# Patient Record
Sex: Female | Born: 1960 | Race: Black or African American | Hispanic: No | Marital: Single | Smoking: Never smoker
Health system: Southern US, Community
[De-identification: ages and names within clinical notes are randomized; demographics above are authoritative.]

## PROBLEM LIST (undated history)

## (undated) DIAGNOSIS — D649 Anemia, unspecified: Secondary | ICD-10-CM

## (undated) DIAGNOSIS — J45909 Unspecified asthma, uncomplicated: Secondary | ICD-10-CM

## (undated) DIAGNOSIS — I10 Essential (primary) hypertension: Secondary | ICD-10-CM

## (undated) DIAGNOSIS — F419 Anxiety disorder, unspecified: Secondary | ICD-10-CM

## (undated) DIAGNOSIS — K219 Gastro-esophageal reflux disease without esophagitis: Secondary | ICD-10-CM

## (undated) DIAGNOSIS — F1010 Alcohol abuse, uncomplicated: Secondary | ICD-10-CM

## (undated) DIAGNOSIS — E871 Hypo-osmolality and hyponatremia: Secondary | ICD-10-CM

## (undated) HISTORY — PX: ANKLE SURGERY: SHX546

---

## 1999-06-12 ENCOUNTER — Encounter: Payer: Self-pay | Admitting: Emergency Medicine

## 1999-06-12 ENCOUNTER — Emergency Department (HOSPITAL_COMMUNITY): Admission: EM | Admit: 1999-06-12 | Discharge: 1999-06-12 | Payer: Self-pay | Admitting: Emergency Medicine

## 2000-05-07 ENCOUNTER — Encounter: Payer: Self-pay | Admitting: Emergency Medicine

## 2000-05-07 ENCOUNTER — Emergency Department (HOSPITAL_COMMUNITY): Admission: EM | Admit: 2000-05-07 | Discharge: 2000-05-07 | Payer: Self-pay | Admitting: Emergency Medicine

## 2001-02-27 ENCOUNTER — Emergency Department (HOSPITAL_COMMUNITY): Admission: EM | Admit: 2001-02-27 | Discharge: 2001-02-27 | Payer: Self-pay

## 2004-01-04 ENCOUNTER — Encounter (INDEPENDENT_AMBULATORY_CARE_PROVIDER_SITE_OTHER): Payer: Self-pay | Admitting: Nurse Practitioner

## 2004-01-04 LAB — CONVERTED CEMR LAB
Chlamydia, DNA Probe: NEGATIVE
GC Probe Amp, Genital: NEGATIVE
Pap Smear: NORMAL

## 2004-01-11 ENCOUNTER — Ambulatory Visit (HOSPITAL_COMMUNITY): Admission: RE | Admit: 2004-01-11 | Discharge: 2004-01-11 | Payer: Self-pay | Admitting: Family Medicine

## 2004-02-06 ENCOUNTER — Ambulatory Visit: Payer: Self-pay | Admitting: Family Medicine

## 2004-03-07 ENCOUNTER — Ambulatory Visit: Payer: Self-pay | Admitting: Family Medicine

## 2004-04-09 ENCOUNTER — Ambulatory Visit: Payer: Self-pay | Admitting: Internal Medicine

## 2004-04-25 ENCOUNTER — Ambulatory Visit: Payer: Self-pay | Admitting: *Deleted

## 2004-04-25 ENCOUNTER — Ambulatory Visit: Payer: Self-pay | Admitting: Internal Medicine

## 2004-05-09 ENCOUNTER — Ambulatory Visit: Payer: Self-pay | Admitting: Family Medicine

## 2004-05-23 ENCOUNTER — Ambulatory Visit: Payer: Self-pay | Admitting: Family Medicine

## 2004-06-07 ENCOUNTER — Ambulatory Visit: Payer: Self-pay | Admitting: Family Medicine

## 2004-06-18 ENCOUNTER — Ambulatory Visit: Payer: Self-pay | Admitting: Internal Medicine

## 2004-07-11 ENCOUNTER — Ambulatory Visit: Payer: Self-pay | Admitting: Family Medicine

## 2004-08-06 ENCOUNTER — Ambulatory Visit: Payer: Self-pay | Admitting: Family Medicine

## 2004-09-12 ENCOUNTER — Ambulatory Visit: Payer: Self-pay | Admitting: Family Medicine

## 2004-10-15 ENCOUNTER — Ambulatory Visit: Payer: Self-pay | Admitting: Family Medicine

## 2004-11-15 ENCOUNTER — Ambulatory Visit: Payer: Self-pay | Admitting: Family Medicine

## 2006-08-13 ENCOUNTER — Encounter (INDEPENDENT_AMBULATORY_CARE_PROVIDER_SITE_OTHER): Payer: Self-pay | Admitting: Nurse Practitioner

## 2006-08-13 ENCOUNTER — Ambulatory Visit: Payer: Self-pay | Admitting: Internal Medicine

## 2006-08-13 LAB — CONVERTED CEMR LAB
ALT: 18 U/L
AST: 26 U/L
Albumin: 4.3 g/dL
Alkaline Phosphatase: 53 U/L
BUN: 8 mg/dL
Calcium: 9.9 mg/dL
Chloride: 104 meq/L
Cholesterol: 187 mg/dL
Creatinine, Ser: 0.85 mg/dL
Glucose, Bld: 86 mg/dL
HDL: 104 mg/dL
HIV-1 Genotyping, DNA Sequencing: NEGATIVE
LDL Cholesterol: 70 mg/dL
Potassium: 4.6 meq/L
Sodium: 139 meq/L
TSH: 2.605 u[IU]/mL
Total Bilirubin: 0.4 mg/dL
Total CHOL/HDL Ratio: 1.8
Triglycerides: 64 mg/dL
VLDL: 13 mg/dL

## 2006-12-31 ENCOUNTER — Ambulatory Visit: Payer: Self-pay | Admitting: Internal Medicine

## 2006-12-31 ENCOUNTER — Encounter (INDEPENDENT_AMBULATORY_CARE_PROVIDER_SITE_OTHER): Payer: Self-pay | Admitting: Nurse Practitioner

## 2007-01-01 ENCOUNTER — Encounter (INDEPENDENT_AMBULATORY_CARE_PROVIDER_SITE_OTHER): Payer: Self-pay | Admitting: Nurse Practitioner

## 2007-01-01 LAB — CONVERTED CEMR LAB
Basophils Absolute: 0 10*3/uL (ref 0.0–0.1)
Eosinophils Absolute: 0 10*3/uL (ref 0.0–0.7)
HCT: 39.4 % (ref 36.0–46.0)
Hemoglobin: 12.9 g/dL (ref 12.0–15.0)
MCV: 107.9 fL — ABNORMAL HIGH (ref 78.0–100.0)
Monocytes Absolute: 0.5 10*3/uL (ref 0.2–0.7)
WBC: 5.6 10*3/uL (ref 4.0–10.5)

## 2007-01-02 ENCOUNTER — Ambulatory Visit: Payer: Self-pay | Admitting: Internal Medicine

## 2007-01-08 ENCOUNTER — Ambulatory Visit: Payer: Self-pay | Admitting: Family Medicine

## 2007-01-12 ENCOUNTER — Encounter: Payer: Self-pay | Admitting: Nurse Practitioner

## 2007-01-12 DIAGNOSIS — R609 Edema, unspecified: Secondary | ICD-10-CM | POA: Insufficient documentation

## 2007-01-12 DIAGNOSIS — E538 Deficiency of other specified B group vitamins: Secondary | ICD-10-CM

## 2007-01-12 DIAGNOSIS — I10 Essential (primary) hypertension: Secondary | ICD-10-CM | POA: Insufficient documentation

## 2007-01-12 DIAGNOSIS — E669 Obesity, unspecified: Secondary | ICD-10-CM

## 2007-02-09 ENCOUNTER — Ambulatory Visit: Payer: Self-pay | Admitting: Family Medicine

## 2007-02-11 ENCOUNTER — Encounter (INDEPENDENT_AMBULATORY_CARE_PROVIDER_SITE_OTHER): Payer: Self-pay | Admitting: *Deleted

## 2007-02-18 ENCOUNTER — Ambulatory Visit: Payer: Self-pay | Admitting: Nurse Practitioner

## 2007-02-18 DIAGNOSIS — M79609 Pain in unspecified limb: Secondary | ICD-10-CM | POA: Insufficient documentation

## 2007-02-18 DIAGNOSIS — I868 Varicose veins of other specified sites: Secondary | ICD-10-CM

## 2007-02-18 LAB — CONVERTED CEMR LAB
ALT: 18 units/L (ref 0–35)
AST: 30 units/L (ref 0–37)
Albumin: 3.9 g/dL (ref 3.5–5.2)
Alkaline Phosphatase: 46 units/L (ref 39–117)
BUN: 11 mg/dL (ref 6–23)
Basophils Absolute: 0 10*3/uL (ref 0.0–0.1)
Cholesterol: 171 mg/dL (ref 0–200)
Creatinine, Ser: 0.72 mg/dL (ref 0.40–1.20)
Eosinophils Absolute: 0 10*3/uL (ref 0.0–0.7)
Glucose, Bld: 98 mg/dL (ref 70–99)
Hemoglobin: 12.4 g/dL (ref 12.0–15.0)
Lymphs Abs: 2.1 10*3/uL (ref 0.7–3.3)
MCHC: 32.8 g/dL (ref 30.0–36.0)
Neutrophils Relative %: 53 % (ref 43–77)
Platelets: 270 10*3/uL (ref 150–400)
Potassium: 4.4 meq/L (ref 3.5–5.3)
RBC: 3.28 M/uL — ABNORMAL LOW (ref 3.87–5.11)
Sodium: 141 meq/L (ref 135–145)
TSH: 3.115 microintl units/mL (ref 0.350–5.50)
Total Protein: 6.8 g/dL (ref 6.0–8.3)

## 2007-02-20 ENCOUNTER — Encounter (INDEPENDENT_AMBULATORY_CARE_PROVIDER_SITE_OTHER): Payer: Self-pay | Admitting: Nurse Practitioner

## 2007-03-02 ENCOUNTER — Emergency Department (HOSPITAL_COMMUNITY): Admission: EM | Admit: 2007-03-02 | Discharge: 2007-03-02 | Payer: Self-pay | Admitting: Emergency Medicine

## 2007-03-03 ENCOUNTER — Encounter (INDEPENDENT_AMBULATORY_CARE_PROVIDER_SITE_OTHER): Payer: Self-pay | Admitting: Emergency Medicine

## 2007-03-03 ENCOUNTER — Ambulatory Visit: Payer: Self-pay | Admitting: Vascular Surgery

## 2007-03-03 ENCOUNTER — Ambulatory Visit (HOSPITAL_COMMUNITY): Admission: RE | Admit: 2007-03-03 | Discharge: 2007-03-03 | Payer: Self-pay | Admitting: Emergency Medicine

## 2007-03-08 ENCOUNTER — Emergency Department (HOSPITAL_COMMUNITY): Admission: EM | Admit: 2007-03-08 | Discharge: 2007-03-08 | Payer: Self-pay | Admitting: Emergency Medicine

## 2007-03-18 ENCOUNTER — Ambulatory Visit: Payer: Self-pay | Admitting: Family Medicine

## 2007-03-18 DIAGNOSIS — M7750 Other enthesopathy of unspecified foot: Secondary | ICD-10-CM | POA: Insufficient documentation

## 2007-03-18 DIAGNOSIS — I8000 Phlebitis and thrombophlebitis of superficial vessels of unspecified lower extremity: Secondary | ICD-10-CM

## 2007-03-23 ENCOUNTER — Encounter (INDEPENDENT_AMBULATORY_CARE_PROVIDER_SITE_OTHER): Payer: Self-pay | Admitting: *Deleted

## 2007-04-13 ENCOUNTER — Ambulatory Visit: Payer: Self-pay | Admitting: Nurse Practitioner

## 2007-04-21 ENCOUNTER — Ambulatory Visit (HOSPITAL_COMMUNITY): Admission: RE | Admit: 2007-04-21 | Discharge: 2007-04-21 | Payer: Self-pay | Admitting: Family Medicine

## 2007-05-29 ENCOUNTER — Encounter: Admission: RE | Admit: 2007-05-29 | Discharge: 2007-05-29 | Payer: Self-pay | Admitting: Family Medicine

## 2007-07-22 ENCOUNTER — Ambulatory Visit: Payer: Self-pay | Admitting: Nurse Practitioner

## 2007-11-04 ENCOUNTER — Emergency Department (HOSPITAL_COMMUNITY): Admission: EM | Admit: 2007-11-04 | Discharge: 2007-11-04 | Payer: Self-pay | Admitting: Emergency Medicine

## 2007-11-06 ENCOUNTER — Telehealth (INDEPENDENT_AMBULATORY_CARE_PROVIDER_SITE_OTHER): Payer: Self-pay | Admitting: Nurse Practitioner

## 2007-11-18 ENCOUNTER — Encounter (INDEPENDENT_AMBULATORY_CARE_PROVIDER_SITE_OTHER): Payer: Self-pay | Admitting: *Deleted

## 2007-11-25 ENCOUNTER — Ambulatory Visit: Payer: Self-pay | Admitting: Family Medicine

## 2007-11-25 DIAGNOSIS — M109 Gout, unspecified: Secondary | ICD-10-CM

## 2007-11-25 DIAGNOSIS — F1010 Alcohol abuse, uncomplicated: Secondary | ICD-10-CM

## 2007-12-11 ENCOUNTER — Ambulatory Visit: Payer: Self-pay | Admitting: Family Medicine

## 2007-12-14 ENCOUNTER — Ambulatory Visit: Payer: Self-pay | Admitting: Family Medicine

## 2008-01-05 ENCOUNTER — Telehealth (INDEPENDENT_AMBULATORY_CARE_PROVIDER_SITE_OTHER): Payer: Self-pay | Admitting: Family Medicine

## 2008-01-05 ENCOUNTER — Emergency Department (HOSPITAL_COMMUNITY): Admission: EM | Admit: 2008-01-05 | Discharge: 2008-01-05 | Payer: Self-pay | Admitting: Emergency Medicine

## 2008-01-18 ENCOUNTER — Encounter (INDEPENDENT_AMBULATORY_CARE_PROVIDER_SITE_OTHER): Payer: Self-pay | Admitting: Family Medicine

## 2008-01-18 ENCOUNTER — Ambulatory Visit: Payer: Self-pay | Admitting: Family Medicine

## 2008-01-28 ENCOUNTER — Telehealth (INDEPENDENT_AMBULATORY_CARE_PROVIDER_SITE_OTHER): Payer: Self-pay | Admitting: Family Medicine

## 2008-01-28 LAB — CONVERTED CEMR LAB
ALT: 11 units/L (ref 0–35)
Albumin: 4.3 g/dL (ref 3.5–5.2)
Alkaline Phosphatase: 67 units/L (ref 39–117)
Basophils Absolute: 0 10*3/uL (ref 0.0–0.1)
Calcium: 9.9 mg/dL (ref 8.4–10.5)
Creatinine, Ser: 0.82 mg/dL (ref 0.40–1.20)
Eosinophils Relative: 1 % (ref 0–5)
Glucose, Bld: 108 mg/dL — ABNORMAL HIGH (ref 70–99)
HDL: 74 mg/dL (ref >39)
Hemoglobin: 13.6 g/dL (ref 12.0–15.0)
LDL Cholesterol: 31 mg/dL (ref 0–99)
Lymphocytes Relative: 32 % (ref 12–46)
Lymphs Abs: 2.1 10*3/uL (ref 0.7–4.0)
Monocytes Absolute: 0.6 10*3/uL (ref 0.1–1.0)
Neutro Abs: 3.8 10*3/uL (ref 1.7–7.7)
Neutrophils Relative %: 57 % (ref 43–77)
RDW: 17.1 % — ABNORMAL HIGH (ref 11.5–15.5)
Sodium: 141 meq/L (ref 135–145)
TSH: 1.418 microintl units/mL (ref 0.350–4.50)
Total Bilirubin: 0.3 mg/dL (ref 0.3–1.2)
Total CHOL/HDL Ratio: 2

## 2008-02-18 ENCOUNTER — Ambulatory Visit: Payer: Self-pay | Admitting: Family Medicine

## 2008-02-24 ENCOUNTER — Ambulatory Visit: Payer: Self-pay | Admitting: Family Medicine

## 2008-02-24 DIAGNOSIS — M545 Low back pain: Secondary | ICD-10-CM

## 2008-02-25 ENCOUNTER — Ambulatory Visit (HOSPITAL_COMMUNITY): Admission: RE | Admit: 2008-02-25 | Discharge: 2008-02-25 | Payer: Self-pay | Admitting: Family Medicine

## 2008-03-22 ENCOUNTER — Ambulatory Visit: Payer: Self-pay | Admitting: Family Medicine

## 2008-04-05 ENCOUNTER — Telehealth (INDEPENDENT_AMBULATORY_CARE_PROVIDER_SITE_OTHER): Payer: Self-pay | Admitting: Family Medicine

## 2008-04-25 ENCOUNTER — Ambulatory Visit: Payer: Self-pay | Admitting: Family Medicine

## 2008-05-13 ENCOUNTER — Encounter: Admission: RE | Admit: 2008-05-13 | Discharge: 2008-05-13 | Payer: Self-pay | Admitting: Family Medicine

## 2008-05-25 ENCOUNTER — Ambulatory Visit: Payer: Self-pay | Admitting: Family Medicine

## 2008-06-24 ENCOUNTER — Ambulatory Visit: Payer: Self-pay | Admitting: Family Medicine

## 2008-06-24 ENCOUNTER — Telehealth (INDEPENDENT_AMBULATORY_CARE_PROVIDER_SITE_OTHER): Payer: Self-pay | Admitting: Family Medicine

## 2008-07-26 ENCOUNTER — Ambulatory Visit: Payer: Self-pay | Admitting: Family Medicine

## 2008-08-04 ENCOUNTER — Telehealth (INDEPENDENT_AMBULATORY_CARE_PROVIDER_SITE_OTHER): Payer: Self-pay | Admitting: Family Medicine

## 2008-08-17 ENCOUNTER — Telehealth (INDEPENDENT_AMBULATORY_CARE_PROVIDER_SITE_OTHER): Payer: Self-pay | Admitting: Family Medicine

## 2008-09-06 ENCOUNTER — Ambulatory Visit: Payer: Self-pay | Admitting: Family Medicine

## 2008-10-06 ENCOUNTER — Ambulatory Visit: Payer: Self-pay | Admitting: Family Medicine

## 2008-11-07 ENCOUNTER — Ambulatory Visit: Payer: Self-pay | Admitting: Family Medicine

## 2008-11-30 ENCOUNTER — Encounter (INDEPENDENT_AMBULATORY_CARE_PROVIDER_SITE_OTHER): Payer: Self-pay | Admitting: Nurse Practitioner

## 2008-12-16 ENCOUNTER — Ambulatory Visit: Payer: Self-pay | Admitting: Physician Assistant

## 2008-12-19 ENCOUNTER — Ambulatory Visit: Payer: Self-pay | Admitting: Internal Medicine

## 2009-01-17 ENCOUNTER — Telehealth (INDEPENDENT_AMBULATORY_CARE_PROVIDER_SITE_OTHER): Payer: Self-pay | Admitting: Family Medicine

## 2009-01-20 ENCOUNTER — Ambulatory Visit: Payer: Self-pay | Admitting: Nurse Practitioner

## 2009-02-07 ENCOUNTER — Ambulatory Visit: Payer: Self-pay | Admitting: Nurse Practitioner

## 2009-02-07 DIAGNOSIS — K219 Gastro-esophageal reflux disease without esophagitis: Secondary | ICD-10-CM

## 2009-02-09 ENCOUNTER — Encounter (INDEPENDENT_AMBULATORY_CARE_PROVIDER_SITE_OTHER): Payer: Self-pay | Admitting: Nurse Practitioner

## 2009-02-09 LAB — CONVERTED CEMR LAB
ALT: 13 units/L (ref 0–35)
AST: 20 units/L (ref 0–37)
Albumin: 4.1 g/dL (ref 3.5–5.2)
Alkaline Phosphatase: 48 units/L (ref 39–117)
Basophils Relative: 0 % (ref 0–1)
Calcium: 9.1 mg/dL (ref 8.4–10.5)
Chloride: 107 meq/L (ref 96–112)
Glucose, Bld: 98 mg/dL (ref 70–99)
HCT: 41.8 % (ref 36.0–46.0)
Hemoglobin: 13.6 g/dL (ref 12.0–15.0)
Lymphocytes Relative: 51 % — ABNORMAL HIGH (ref 12–46)
Lymphs Abs: 1.6 10*3/uL (ref 0.7–4.0)
MCHC: 32.5 g/dL (ref 30.0–36.0)
MCV: 103.2 fL — ABNORMAL HIGH (ref 78.0–100.0)
Neutrophils Relative %: 40 % — ABNORMAL LOW (ref 43–77)
Platelets: 245 10*3/uL (ref 150–400)
Potassium: 4.1 meq/L (ref 3.5–5.3)
RDW: 14.1 % (ref 11.5–15.5)
Total Protein: 6.8 g/dL (ref 6.0–8.3)
WBC: 3.1 10*3/uL — ABNORMAL LOW (ref 4.0–10.5)

## 2009-02-14 ENCOUNTER — Telehealth (INDEPENDENT_AMBULATORY_CARE_PROVIDER_SITE_OTHER): Payer: Self-pay | Admitting: Nurse Practitioner

## 2009-02-20 ENCOUNTER — Telehealth (INDEPENDENT_AMBULATORY_CARE_PROVIDER_SITE_OTHER): Payer: Self-pay | Admitting: Nurse Practitioner

## 2009-02-20 ENCOUNTER — Ambulatory Visit: Payer: Self-pay | Admitting: Internal Medicine

## 2009-02-28 ENCOUNTER — Encounter (INDEPENDENT_AMBULATORY_CARE_PROVIDER_SITE_OTHER): Payer: Self-pay | Admitting: *Deleted

## 2009-03-22 ENCOUNTER — Ambulatory Visit: Payer: Self-pay | Admitting: Nurse Practitioner

## 2009-04-25 ENCOUNTER — Ambulatory Visit: Payer: Self-pay | Admitting: Nurse Practitioner

## 2009-05-21 ENCOUNTER — Inpatient Hospital Stay (HOSPITAL_COMMUNITY): Admission: EM | Admit: 2009-05-21 | Discharge: 2009-05-23 | Payer: Self-pay | Admitting: Emergency Medicine

## 2009-06-01 ENCOUNTER — Telehealth (INDEPENDENT_AMBULATORY_CARE_PROVIDER_SITE_OTHER): Payer: Self-pay | Admitting: Nurse Practitioner

## 2009-06-07 ENCOUNTER — Encounter (INDEPENDENT_AMBULATORY_CARE_PROVIDER_SITE_OTHER): Payer: Self-pay | Admitting: Nurse Practitioner

## 2009-07-05 ENCOUNTER — Ambulatory Visit: Payer: Self-pay | Admitting: Nurse Practitioner

## 2009-07-05 DIAGNOSIS — S82899A Other fracture of unspecified lower leg, initial encounter for closed fracture: Secondary | ICD-10-CM | POA: Insufficient documentation

## 2009-07-06 LAB — CONVERTED CEMR LAB
HCT: 35.7 % — ABNORMAL LOW (ref 36.0–46.0)
Hemoglobin: 11.4 g/dL — ABNORMAL LOW (ref 12.0–15.0)
MCHC: 31.9 g/dL (ref 30.0–36.0)
Retic Ct Pct: 1.7 % (ref 0.4–3.1)
WBC: 4.8 10*3/uL (ref 4.0–10.5)

## 2009-07-12 ENCOUNTER — Ambulatory Visit: Payer: Self-pay | Admitting: Nurse Practitioner

## 2009-07-14 ENCOUNTER — Telehealth (INDEPENDENT_AMBULATORY_CARE_PROVIDER_SITE_OTHER): Payer: Self-pay | Admitting: Nurse Practitioner

## 2009-07-18 ENCOUNTER — Encounter (INDEPENDENT_AMBULATORY_CARE_PROVIDER_SITE_OTHER): Payer: Self-pay | Admitting: *Deleted

## 2009-08-09 ENCOUNTER — Ambulatory Visit: Payer: Self-pay | Admitting: Nurse Practitioner

## 2009-10-19 ENCOUNTER — Ambulatory Visit: Payer: Self-pay | Admitting: Nurse Practitioner

## 2009-10-19 ENCOUNTER — Telehealth (INDEPENDENT_AMBULATORY_CARE_PROVIDER_SITE_OTHER): Payer: Self-pay | Admitting: Nurse Practitioner

## 2009-10-24 ENCOUNTER — Encounter (INDEPENDENT_AMBULATORY_CARE_PROVIDER_SITE_OTHER): Payer: Self-pay | Admitting: *Deleted

## 2009-10-26 ENCOUNTER — Encounter (INDEPENDENT_AMBULATORY_CARE_PROVIDER_SITE_OTHER): Payer: Self-pay | Admitting: *Deleted

## 2009-11-16 ENCOUNTER — Ambulatory Visit: Payer: Self-pay | Admitting: Nurse Practitioner

## 2009-12-01 ENCOUNTER — Ambulatory Visit: Payer: Self-pay | Admitting: Nurse Practitioner

## 2009-12-01 DIAGNOSIS — A599 Trichomoniasis, unspecified: Secondary | ICD-10-CM | POA: Insufficient documentation

## 2009-12-04 ENCOUNTER — Encounter (INDEPENDENT_AMBULATORY_CARE_PROVIDER_SITE_OTHER): Payer: Self-pay | Admitting: Nurse Practitioner

## 2009-12-13 ENCOUNTER — Ambulatory Visit (HOSPITAL_COMMUNITY): Admission: RE | Admit: 2009-12-13 | Discharge: 2009-12-13 | Payer: Self-pay | Admitting: Internal Medicine

## 2009-12-18 ENCOUNTER — Ambulatory Visit: Payer: Self-pay | Admitting: Nurse Practitioner

## 2009-12-18 LAB — CONVERTED CEMR LAB
Glucose, Urine, Semiquant: NEGATIVE
Nitrite: NEGATIVE
Specific Gravity, Urine: >=1.03
WBC Urine, dipstick: NEGATIVE

## 2009-12-20 ENCOUNTER — Ambulatory Visit: Payer: Self-pay | Admitting: Nurse Practitioner

## 2010-01-18 ENCOUNTER — Ambulatory Visit: Payer: Self-pay | Admitting: Nurse Practitioner

## 2010-02-19 ENCOUNTER — Ambulatory Visit: Payer: Self-pay | Admitting: Nurse Practitioner

## 2010-03-21 ENCOUNTER — Ambulatory Visit: Payer: Self-pay | Admitting: Nurse Practitioner

## 2010-04-27 ENCOUNTER — Ambulatory Visit: Payer: Self-pay | Admitting: Nurse Practitioner

## 2010-05-28 ENCOUNTER — Telehealth (INDEPENDENT_AMBULATORY_CARE_PROVIDER_SITE_OTHER): Payer: Self-pay | Admitting: Internal Medicine

## 2010-06-13 ENCOUNTER — Ambulatory Visit
Admission: RE | Admit: 2010-06-13 | Discharge: 2010-06-13 | Payer: Self-pay | Source: Home / Self Care | Attending: Nurse Practitioner | Admitting: Nurse Practitioner

## 2010-06-13 ENCOUNTER — Encounter (INDEPENDENT_AMBULATORY_CARE_PROVIDER_SITE_OTHER): Payer: Self-pay | Admitting: Nurse Practitioner

## 2010-06-21 ENCOUNTER — Encounter (INDEPENDENT_AMBULATORY_CARE_PROVIDER_SITE_OTHER): Payer: Self-pay | Admitting: Nurse Practitioner

## 2010-06-24 LAB — CONVERTED CEMR LAB
Bilirubin Urine: NEGATIVE
Chlamydia, DNA Probe: NEGATIVE
Eosinophils Relative: 1 % (ref 0–5)
GC Probe Amp, Genital: NEGATIVE
Glucose, Urine, Semiquant: NEGATIVE
HCT: 36.5 % (ref 36.0–46.0)
Hemoglobin: 11.6 g/dL — ABNORMAL LOW (ref 12.0–15.0)
KOH Prep: NEGATIVE
Ketones, urine, test strip: NEGATIVE
Lymphocytes Relative: 38 % (ref 12–46)
MCHC: 31.8 g/dL (ref 30.0–36.0)
MCV: 91.3 fL (ref 78.0–100.0)
Nitrite: NEGATIVE
OCCULT 1: NEGATIVE
Protein, U semiquant: NEGATIVE
RDW: 14.5 % (ref 11.5–15.5)
Specific Gravity, Urine: 1.01
WBC: 3.6 10*3/uL — ABNORMAL LOW (ref 4.0–10.5)
pH: 6

## 2010-06-26 NOTE — Progress Notes (Signed)
Summary: Office Visit//DEPRESSION SCREENING  Office Visit//DEPRESSION SCREENING   Imported By: Arta Bruce 12/04/2009 08:56:32  _____________________________________________________________________  External Attachment:    Type:   Image     Comment:   External Document

## 2010-06-26 NOTE — Letter (Signed)
Summary: Handout Printed  Printed Handout:  - Vitamin B12 & Folate Test

## 2010-06-26 NOTE — Letter (Signed)
Summary: [facility]  [facility]   Imported By: Arta Bruce 08/02/2009 15:21:48  _____________________________________________________________________  External Attachment:    Type:   Image     Comment:   External Document

## 2010-06-26 NOTE — Letter (Signed)
Summary: *HSN Results Follow up  [facility]  [address] [place], [place] [address]   Phone: [phone]  Fax: [phone]      10/26/2009   Caroline Harvey [address] [place], [place]  [address]   Dear  [name],                            ____S.Drinkard,FNP   ____D. Gore,FNP       ____B. McPherson,MD   ____V. Rankins,MD    ____E. Mulberry,MD    ____N. Daphine Deutscher, FNP  ____D. Reche Dixon, MD    ____K. Philipp Deputy, MD    ____Other     This letter is to inform you that your recent test(s):  _______Pap Smear    _______Lab Test     _______X-ray    _______ is within acceptable limits  _______ requires a medication change  _______ requires a follow-up lab visit  _______ requires a follow-up visit with your provider   Comments: We have tried to reach you at [phone].  Please contact the office at your earliest convenience.      _________________________________________________________ If you have any questions, please contact our office                     Sincerely,  Levon Hedger [facility]

## 2010-06-26 NOTE — Assessment & Plan Note (Signed)
Summary: HTN   Vital Signs:  Patient profile:   50 year old female LMP:     06/23/2009 Weight:      326.6 pounds Temp:     97.8 degrees F oral Pulse rate:   77 / minute Pulse rhythm:   regular Resp:     20 per minute BP sitting:   162 / 93  (left arm) Cuff size:   regular  Vitals Entered By: Levon Hedger (July 05, 2009 2:13 PM) CC: HTN...B-12 injection, Hypertension Management Is Patient Diabetic? No Pain Assessment Patient in pain? no       Does patient need assistance? Functional Status Self care Ambulation Normal LMP (date): 06/23/2009 LMP - Character: normal Menarche (age onset years): 13    Menstrual flow (days): 3 Enter LMP: 06/23/2009 Last PAP Result normal   CC:  HTN...B-12 injection and Hypertension Management.  History of Present Illness:  Pt into the office with complaints of htn.  Reports that she has been having the nurse come into her home for evaluation following a slip at home on the ice during which she sustained a right ankle fracture.  Visits were twice weekly.  BP was elevated during those checks.  (no documentation to support but [facility] nurse did call this office) Pt also has follow up with orthopedic - Dr. Durene Cal.  Next appt is March 4th. Her last appt was February 2nd. She has finished taking her coumadin as ordered following the incident. She has a camboot in place and reports that she wears it the majority of the day but is allowed to have 3-4 times off for short periods of time for "exercise"  Vitamin B 12 Deficiency - Pt has been getting monthly injections. None in about 3 months. + increase in fatigue without the injections  Hypertension History:      She denies headache, chest pain, and palpitations.  She notes no problems with any antihypertensive medication side effects.  She has been taking Lisinopril 20mg  by mouth as ordered but pt does not have the medication present with her today.        Positive major cardiovascular risk  factors include hypertension.  Negative major cardiovascular risk factors include female age less than 13 years old, negative family history for ischemic heart disease, and non-tobacco-user status.     Allergies (verified): 1)  ! Cvs Ibuprofen (Ibuprofen)  Review of Systems General:  Denies fever. CV:  Denies chest pain or discomfort. Resp:  Denies cough. GI:  Denies abdominal pain, nausea, and vomiting. MS:  Complains of joint pain; right foot pain and has ongoing f/u with ortho.  Physical Exam  General:  alert.  obese Head:  normocephalic.   Lungs:  normal breath sounds.   Heart:  normal rate and regular rhythm.   Msk:  right cam boot in place Neurologic:  alert & oriented X3.   Skin:  color normal.   Psych:  Oriented X3.     Impression & Recommendations:  Problem # 1:  HYPERTENSION, BENIGN (ICD-401.1) increase lisinipril to 20mg  - 2 tablets by mouth daily will f/u in 1 week for bp check DASH diet if still elevated will add another medication Her updated medication list for this problem includes:    Lisinopril 20 Mg Tabs (Lisinopril) .Marland Kitchen... 2 tablets  by mouth every morning for blood pressure    Furosemide 20 Mg Tabs (Furosemide) ..... One tablet by mouth daily as needed for swelling  Problem # 2:  B12 DEFICIENCY (ICD-266.2)  will recheck labs today Orders: T- * Misc. Laboratory test [phone])  Complete Medication List: 1)  Cyanocobalamin 1000 Mcg/ml Inj Soln (Cyanocobalamin) .... One injection monthly im 2)  Protonix 40 Mg Tbec (Pantoprazole sodium) .... One tablet by mouth daily 30 minutes before breakfast 3)  Allopurinol 300 Mg Tabs (Allopurinol) .... One tablet by mouth daily for gout 4)  Lisinopril 20 Mg Tabs (Lisinopril) .... 2 tablets  by mouth every morning for blood pressure 5)  Flexeril 10 Mg Tabs (Cyclobenzaprine hcl) .... Take 1 tablet by mouth every 8 hours as needed muscle spasm 6)  Tramadol Hcl 50 Mg Tabs (Tramadol hcl) .... Take 1-2 tablets by mouth every  6-8 hours as needed gout flare. take wth colchicine 7)  Furosemide 20 Mg Tabs (Furosemide) .... One tablet by mouth daily as needed for swelling  Hypertension Assessment/Plan:      The patient's hypertensive risk group is category A: No risk factors and no target organ damage.  Her calculated 10 year risk of coronary heart disease is 3 %.  Today's blood pressure is 162/93.  Her blood pressure goal is < 140/90.  Patient Instructions: 1)  Lisinopril 20mg  - 2 tablets by mouth daily x 1 week. 2)  Follow up in 1 weeks for blood pressure check in the afternoon. 3)  Take your medication before this visit 4)  If blood pressure is still high will need to add another medications 5)  Your blood is 162/93. 6)  Your goal is 130/85. 7)  Your complete physical exam is due Scheduled this after you are cleared by orthopedic

## 2010-06-26 NOTE — Assessment & Plan Note (Signed)
Summary: Complete Physical Exam   Vital Signs:  Patient profile:   50 year old female LMP:     10/2009 Weight:      323.0 pounds BMI:     48.57 Pulse rate:   70 / minute Pulse rhythm:   regular Resp:     20 per minute BP sitting:   128 / 77  (left arm) Cuff size:   large  Vitals Entered By: Levon Hedger (December 01, 2009 9:34 AM)  Nutrition Counseling: Patient's BMI is greater than 25 and therefore counseled on weight management options. CC:   CPP, Hypertension Management Is Patient Diabetic? No Pain Assessment Patient in pain? no       Does patient need assistance? Functional Status Self care Ambulation Normal LMP (date): 10/2009 LMP - Character: normal Menarche (age onset years): 13    Menstrual flow (days): 3 Enter LMP: 10/2009 Last PAP Result normal   CC:    CPP and Hypertension Management.  History of Present Illness:  Pt into the office for complete physical exam  PAP - last done in 2009 in this office, all normal Menses still monthly No tubal ligation or hysterectomy No cervical or ovarian cancer the family Pt does have a same sex partner  Mammogram - paternal aunt with breast cancer last mammogram done 05/2008  Optho - no glasses or contacts - no current problems with eyes  Dental - no recent dental exam  Obesity - pt is really interested in losing weight.  She has lost 5 pounds since her last visit. Pt has started to increase the fruit in her diet.   She has started walking and does so about 4- times per week.  Anticoagulation Management History:      Negative risk factors for bleeding include an age less than 56 years old and no history of CVA/TIA.  The bleeding index is 'low risk'.  Positive CHADS2 values include History of HTN.  Negative CHADS2 values include Age > 29 years old, History of Diabetes, and Prior Stroke/CVA/TIA.    Hypertension History:      She denies headache, chest pain, and palpitations.  She notes no problems with any  antihypertensive medication side effects.        Positive major cardiovascular risk factors include hypertension.  Negative major cardiovascular risk factors include female age less than 39 years old, no history of diabetes or hyperlipidemia, negative family history for ischemic heart disease, and non-tobacco-user status.        Further assessment for target organ damage reveals no history of ASHD, cardiac end-organ damage (CHF/LVH), stroke/TIA, peripheral vascular disease, renal insufficiency, or hypertensive retinopathy.      Habits & Providers  Alcohol-Tobacco-Diet     Alcohol type: liquor     Tobacco Status: never     Passive Smoke Exposure: no  Exercise-Depression-Behavior     Does Patient Exercise: yes     Exercise Counseling: to improve exercise regimen     Type of exercise: walking     Have you felt down or hopeless? no     Have you felt little pleasure in things? no     Depression Counseling: not indicated; screening negative for depression     Drug Use: no     Seat Belt Use: 100     Sun Exposure: occasionally  Comments: PHQ-9 score = 4  Allergies (verified): 1)  ! Cvs Ibuprofen (Ibuprofen)  Social History: Does Patient Exercise:  yes  Review of Systems General:  Denies fever. Eyes:  Denies blurring. ENT:  Denies earache. CV:  Denies chest pain or discomfort. Resp:  Denies cough. GI:  Denies abdominal pain, nausea, and vomiting. GU:  Denies discharge. MS:  Denies joint pain. Derm:  Denies dryness. Neuro:  Denies headaches. Psych:  Denies depression.  Physical Exam  General:  alert.  obese Head:  normocephalic.   Eyes:  vision grossly intact, pupils equal, and pupils round.   Ears:  bil TM with bony landmarks present minimal cerumen Nose:  no nasal discharge.   Mouth:  pharynx pink and moist.   Neck:  supple.   Chest Wall:  no deformities.   Breasts:  skin/areolae normal, no masses, and no abnormal thickening.   Lungs:  normal breath sounds.   Heart:   normal rate and regular rhythm.   Abdomen:  soft, non-tender, and normal bowel sounds.   Rectal:  no external abnormalities.    Pelvic Exam  Vulva:      normal appearance.   Urethra and Bladder:      Urethra--no discharge.   Vagina:      copious discharge.   Cervix:      midposition.   Uterus:      smooth.   Adnexa:      nontender bilaterally.   Rectum:      normal, heme negative stool.      Impression & Recommendations:  Problem # 1:  ROUTINE GYNECOLOGICAL EXAMINATION (ICD-V72.31) pap done labs done PHQ-9 score = 4 rec optho and dental exam guaiac negative Orders: T-HIV Antibody  (Reflex) ([id number]) T-Syphilis Test (RPR) ([id number]) Hemoccult Guaiac-1 spec.(in office) (82270)  Problem # 2:  UNSPECIFIED BREAST SCREENING (ICD-V76.10) self breast exam placcard given mammogram scheduled Orders: Mammogram (Screening) (Mammo)  Problem # 3:  HYPERTENSION, BENIGN (ICD-401.1) BP doing good continue current meds Her updated medication list for this problem includes:    Lisinopril 20 Mg Tabs (Lisinopril) .Marland Kitchen... 2 tablets  by mouth every morning for blood pressure (hold)    Furosemide 20 Mg Tabs (Furosemide) ..... 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 One injection monthly im 2)  Protonix 40 Mg Tbec (Pantoprazole sodium) .... One tablet by mouth daily 30 minutes before breakfast 3)  Lisinopril 20 Mg Tabs (Lisinopril) .... 2 tablets  by mouth every morning for blood pressure (hold) 4)  Furosemide 20 Mg Tabs  (Furosemide) .... One tablet by mouth daily as needed for swelling 5)  Ferrous Sulfate 325 (65 Fe) Mg Tabs (Ferrous sulfate) .... One tablet by mouth daily 6)  Metronidazole 500 Mg Tabs (Metronidazole) .... 4 tablets by mouth x 1 dose  Other Orders: T- * Misc. Laboratory test [phone])  Anticoagulation Management Assessment/Plan:             Hypertension Assessment/Plan:      The patient's hypertensive risk group is category A: No risk factors and no target organ damage.  Her calculated 10 year risk of coronary heart disease is 2 %.  Today's blood pressure is 128/77.  Her blood pressure goal is < 140/90.  Patient Instructions: 1)  Continue your efforts  at weight loss 2)  Keep your appointment for mammogram 3)  you will be notified of any abnormal lab results 4)  Continue monthly B-12 injections and return at next scheduled visit for wet prep Prescriptions: METRONIDAZOLE 500 MG TABS (METRONIDAZOLE) 4 tablets by mouth x 1 dose  #4 x 0   Entered and Authorized by:   Lehman Prom FNP   Signed by:   Lehman Prom FNP on 12/01/2009   Method used:   Print then Give to Patient   RxID:   [id number]   Prevention & Chronic Care Immunizations   Influenza vaccine: Not documented    Tetanus booster: 02/07/2009: Tdap    Pneumococcal vaccine: Not documented  Other Screening   Pap smear: normal  (01/19/2008)   Pap smear action/deferral: Ordered  (12/01/2009)   Pap smear due: 12/02/2010    Mammogram: BI-RADS CATEGORY 2:  Benign finding(s).^MM DIGITAL DIAGNOSTIC BILAT  (05/13/2008)   Mammogram action/deferral: Ordered  (12/01/2009)   Smoking status: never  (12/01/2009)  Lipids   Total Cholesterol: 151  (01/18/2008)   Lipid panel action/deferral: Lipid Panel ordered   LDL: 31  (01/18/2008)   LDL Direct: Not documented   HDL: 74  (01/18/2008)   Triglycerides: 229  (01/18/2008)  Hypertension   Last Blood Pressure: 128 / 77  (12/01/2009)   Serum creatinine: 0.66   (02/07/2009)   BMP action: Ordered   Serum potassium 4.1  (02/07/2009) CMP ordered    Basic metabolic panel due: 01/01/2010  Self-Management Support :    Hypertension self-management support: Not documented   Laboratory Results   Urine Tests  Date/Time Received: December 01, 2009 11:35 AM   Routine Urinalysis   Color: lt. yellow Appearance: Clear Glucose: negative   (Normal Range: Negative) Bilirubin: negative   (Normal Range: Negative) Ketone: negative   (Normal Range: Negative) Spec. Gravity: 1.010   (Normal Range: 1.003-1.035) Blood: trace-lysed   (Normal Range: Negative) pH: 6.0   (Normal Range: 5.0-8.0) Protein: negative   (Normal Range: Negative) Urobilinogen: 0.2   (Normal Range: 0-1) Nitrite: negative   (Normal Range: Negative) Leukocyte Esterace: small   (Normal Range: Negative)    Date/Time Received: December 01, 2009 10:45 AM   Wet Mount Source: vaginal WBC/hpf: 1-5 Bacteria/hpf: 1+ Clue cells/hpf: none Yeast/hpf: none Wet Mount KOH: Negative Trichomonas/hpf: few  Stool - Occult Blood Hemmoccult #1: negative Date: 12/01/2009    Laboratory Results   Urine Tests    Routine Urinalysis   Color: lt. yellow Appearance: Clear Glucose: negative   (Normal Range: Negative) Bilirubin: negative   (Normal Range: Negative) Ketone: negative   (Normal Range: Negative) Spec. Gravity: 1.010   (Normal Range: 1.003-1.035) Blood: trace-lysed   (Normal Range: Negative) pH: 6.0   (Normal Range: 5.0-8.0) Protein: negative   (Normal Range: Negative) Urobilinogen: 0.2   (Normal Range: 0-1) Nitrite: negative   (Normal Range: Negative) Leukocyte Esterace: small   (Normal Range: Negative)      Wet Mount/KOH

## 2010-06-26 NOTE — Progress Notes (Signed)
Summary: [facility] NURSE CALLED  Medications Added LISINOPRIL 20 MG TABS (LISINOPRIL) 2 tablets  by mouth every morning for blood pressure       Phone Note Other Incoming   Caller: CATHELEEN-[facility]  [phone] Summary of Call: CATHLEEN THE NURSE FROM [facility] CALLED BECAUSE MS Lessley BROKE HER FOOT A FEW WEEKS AGO AND [facility] STARTED SEEING HER ON 12/29 AFTER SHE GOT HOME FROM THE HOSP. MS CATHLEEN TOOK HER BP THIS MORNING AND IT WAS 152/95 ON THE LEFT ARM AND 145/90 ON THE RIGHT. SHE IS TAKING LISINIPRIL, BUT SHE WILL PROBABLY NEED AN ADJUSTMENT. Initial call taken by: Leodis Rains,  June 01, 2009 12:36 PM  Follow-up for Phone Call        will forward to provider for review// Follow-up by: Mikey College CMA,  June 01, 2009 3:53 PM  Additional Follow-up for Phone Call Additional follow up Details #1::        Spoke with Nicholos Johns from [facility] PT/INR done on Monday/Thursday and blood pressure is still elevated. will have pt increase lisinopril 20mg  - 2 tablets by mouth daily. Verbal order given. Nicholos Johns, RN will inform the pt f/u appt on 06/05/2009 with orthopedic who is regulating pt/inr Additional Follow-up by: Lehman Prom FNP,  June 01, 2009 5:28 PM    New/Updated Medications: LISINOPRIL 20 MG TABS (LISINOPRIL) 2 tablets  by mouth every morning for blood pressure

## 2010-06-26 NOTE — Letter (Signed)
Summary: Handout Printed  Printed Handout:  - Trichomonas 

## 2010-06-26 NOTE — Progress Notes (Signed)
Summary: DIDNT GET FLUID MEDICATION   Phone Note Call from Patient Call back at Home Phone [phone]   Summary of Call: MARTIN PT. MS Boggess CALLED AND SAYS THAT SHE GOR HER PROTONIX AT THE HEALTH DEPT  BUT NOT THE  FLUID PILL AND SHE NEEDS IT TO BE CALLED INTO [facility] ON [address]. Initial call taken by: Leodis Rains,  Oct 19, 2009 1:00 PM  Follow-up for Phone Call        Unable to leave message -- both phone #s "temporarily disconnected" -- letter sent.  Dutch Quint RN  Oct 24, 2009 4:29 PM   Additional Follow-up for Phone Call Additional follow up Details #1::        BOTH Protonix and were sent to [facility]) perhaps that is why she did not get lasix if she went to [facility] Additional Follow-up by: Lehman Prom FNP,  Oct 24, 2009 4:38 PM    Additional Follow-up for Phone Call Additional follow up Details #2::    called [phone] number disconnected.  will mail letter. Follow-up by: Levon Hedger,  October 26, 2009 2:50 PM   Appended Document: DIDNT GET FLUID MEDICATION pt informed of above information.

## 2010-06-26 NOTE — Letter (Signed)
Summary: Generic Letter  [facility]  [address] [place], [place] [address]   Phone: [phone]  Fax: [phone]    07/18/2009  Caroline Harvey [address] [place], [place]  [address]  Dear [name],   We have been unable to reach you by phone. Your Norvasc was called to your pharmacy on 07/12/09. Please call us if you have any questions.        Sincerely,   Gaylyn Cheers RN

## 2010-06-26 NOTE — Assessment & Plan Note (Signed)
Summary: HTN   Vital Signs:  Patient profile:   50 year old female Weight:      327 pounds Temp:     97.9 degrees F Pulse rate:   67 / minute Pulse rhythm:   regular Resp:     20 per minute BP sitting:   127 / 80  (left arm) Cuff size:   large  Vitals Entered By: Vesta Mixer CMA (Oct 19, 2009 8:36 AM) CC: Wants to restart htn meds and b12 shots, Hypertension Management, Abdominal Pain Is Patient Diabetic? No Pain Assessment Patient in pain? no       Does patient need assistance? Ambulation Normal   CC:  Wants to restart htn meds and b12 shots, Hypertension Management, and Abdominal Pain.  History of Present Illness:  Pt into the office for f/u on htn  Obesity - pt is trying to lose weight.  She has started to walk (in limited amounts until released by ortho) F/u with ortho every 2 months to f/u on right ankle fracture.    Dyspepsia History:      She has no alarm features of dyspepsia including no history of melena, hematochezia, dysphagia, persistent vomiting, or involuntary weight loss > 5%.  There is a prior history of GERD.  The patient does not have a prior history of documented ulcer disease.  The dominant symptom is heartburn or acid reflux.  An H-2 blocker medication is not currently being taken.    Hypertension History:      She denies headache, chest pain, and palpitations.  No BP meds in over 1 month because she was not eligible but she has been getting it checked and blood pressure.        Positive major cardiovascular risk factors include hypertension.  Negative major cardiovascular risk factors include female age less than 90 years old, negative family history for ischemic heart disease, and non-tobacco-user status.      Allergies (verified): 1)  ! [facility] Ibuprofen (Ibuprofen)  Review of Systems General:  Complains of fatigue and weakness; overdue for B12 injection. CV:  Denies chest pain or discomfort. Resp:  Denies cough. GI:  Denies indigestion;  symptoms controlled on protonix.  Physical Exam  General:  alert.  obese Head:  normocephalic.   Lungs:  normal breath sounds.   Heart:  normal rate and regular rhythm.   Abdomen:  normal bowel sounds.   Msk:  up to the exam table Neurologic:  alert & oriented X3.   Skin:  color normal.   Psych:  Oriented X3.     Impression & Recommendations:  Problem # 1:  HYPERTENSION, BENIGN (ICD-401.1) BP is stable pt has started following DASH diet - no meds at this time will conitnue to monitor The following medications were removed from the medication list:    Norvasc 5 Mg Tabs (Amlodipine besylate) ..... Once daily for blood pressure Her updated medication list for this problem includes:    Lisinopril 20 Mg Tabs (Lisinopril) .Marland Kitchen... 2 tablets  by mouth every morning for blood pressure (hold)    Furosemide 20 Mg Tabs (Furosemide) ..... One tablet by mouth daily as needed for swelling  Problem # 2:  B12 DEFICIENCY (ICD-266.2)  will give injections today next in 4 weeks  then check labs at CPE visit in 6 weeks  Orders: Vit B12 1000 mcg (J3420) Admin of Therapeutic Inj  intramuscular or subcutaneous (52841)  Problem # 3:  GERD (ICD-530.81) will refill meds Her updated medication list for  this problem includes:    Protonix 40 Mg Tbec (Pantoprazole sodium) ..... One tablet by mouth daily 30 minutes before breakfast  Problem # 4:  FRACTURE, ANKLE, RIGHT (ICD-824.8) pt to f/u with ortho as indicated by that office  Complete Medication List: 1)  Cyanocobalamin 1000 Mcg/ml Inj Soln (Cyanocobalamin) .... One injection monthly im 2)  Protonix 40 Mg Tbec (Pantoprazole sodium) .... One tablet by mouth daily 30 minutes before breakfast 3)  Lisinopril 20 Mg Tabs (Lisinopril) .... 2 tablets  by mouth every morning for blood pressure (hold) 4)  Furosemide 20 Mg Tabs (Furosemide) .... One tablet by mouth daily as needed for swelling 5)  Ferrous Sulfate 325 (65 Fe) Mg Tabs (Ferrous sulfate) ....  One tablet by mouth daily  Hypertension Assessment/Plan:      The patient's hypertensive risk group is category A: No risk factors and no target organ damage.  Her calculated 10 year risk of coronary heart disease is 2 %.  Today's blood pressure is 127/80.  Her blood pressure goal is < 140/90.  Patient Instructions: 1)  Schedule appointment in 4 weeks for vitamin B12 injection 2)  Schedule your next appointment in 6 weeks for a complete physical exam. 3)  You will need PAP, Mammogram, EKG. 4)  Will also have labs anemia panel, lipids 5)  Do not eat after midnight before this visit. Prescriptions: PROTONIX 40 MG  TBEC (PANTOPRAZOLE SODIUM) One tablet by mouth daily 30 minutes before breakfast  #30 x 5   Entered and Authorized by:   Lehman Prom FNP   Signed by:   Lehman Prom FNP on 10/19/2009   Method used:   Faxed to ...       [facility] (retail)       [address] [place], [place]  [address]       Ph: [phone]       Fax: [phone]   RxID:   [id number] FUROSEMIDE 20 MG TABS (FUROSEMIDE) One tablet by mouth daily as needed for swelling  #30 x 0   Entered and Authorized by:   Lehman Prom FNP   Signed by:   Lehman Prom FNP on 10/19/2009   Method used:   Faxed to ...       [facility] (retail)       [address] [place], [place]  [address]       Ph: [phone]       Fax: [phone]   RxID:   [id number] PROTONIX 40 MG  TBEC (PANTOPRAZOLE SODIUM) One tablet by mouth daily 30 minutes before breakfast  #30 x 5   Entered and Authorized by:   Lehman Prom FNP   Signed by:   Lehman Prom FNP on 10/19/2009   Method used:   Electronically to        [facility] (retail)       [address]       [place], [place]  [address]       Ph: [phone]       Fax: [phone]   RxID:   [id number]    Medication  Administration  Injection # 1:    Medication: Vit B12 1000 mcg    Diagnosis: B12 DEFICIENCY (ICD-266.2)    Route: IM    Site: LUOQ gluteus    Exp Date:  01/2011    Lot #: [id number]    Mfr: American Regent    Patient tolerated injection without complications    Given by: Levon Hedger (Oct 19, 2009 12:27 PM)  Orders Added: 1)  Vit B12 1000 mcg [J3420] 2)  Admin of Therapeutic Inj  intramuscular or subcutaneous [96372] 3)  Est. Patient Level III [96045]

## 2010-06-26 NOTE — Progress Notes (Signed)
Summary: BP MEDS WEREN'T CALLED IN   Phone Note Call from Patient Call back at Home Phone [phone]   Reason for Call: Refill Medication Summary of Call: MARTIN PT. MS Dobrowolski WANTS TO KNOW IF HER BP MEDICATION WAS CALLED INTO [facility] ON [address], WHEN  SHE WAS HERE ON THE 16th. SHE SAYS THAT SOMEONE TOLD HER IT WOULD BE CALLED IN. AND THEY WERE GOING TO FIND OUT IF SHE WAS TO STAY ON THAT SAME MEDICINE. Initial call taken by: Leodis Rains,  July 14, 2009 10:57 AM  Follow-up for Phone Call        [phone] phone temporarily out of order [phone] # disconnected will mail letter Follow-up by: Gaylyn Cheers RN,  July 18, 2009 12:49 PM

## 2010-06-26 NOTE — Letter (Signed)
Summary: Generic Letter  [facility]  [address] [place], [place] [address]   Phone: [phone]  Fax: [phone]    10/24/2009  Caroline Harvey [address] [place], [place]  [address]  Dear [name],  We have been unable to reach you by telephone.  Please contact our office at your earliest convenience, so that we may speak with you.  Sincerely,   Dutch Quint RN

## 2010-06-26 NOTE — Letter (Signed)
Summary: *HSN Results Follow up  [facility]  [address] [place], [place] [address]   Phone: [phone]  Fax: [phone]      12/04/2009   Caroline Harvey [address] [place], [place]  [address]   Dear  Caroline Harvey,                            ____S.Drinkard,FNP   ____D. Gore,FNP       ____B. McPherson,MD   ____V. Rankins,MD    ____E. Mulberry,MD    __X__N. Daphine Deutscher, FNP  ____D. Reche Dixon, MD    ____K. Philipp Deputy, MD    ____Other     This letter is to inform you that your recent test(s):  ___X____Pap Smear    ____X___Lab Test     _______X-ray    ____X___ is within acceptable limits  _______ requires a medication change  _______ requires a follow-up lab visit  _______ requires a follow-up visit with your provider   Comments: Labs done during recent office visit show that you are still slightly anemic.  Your vitamin B12 is ok.  Pap Smear results ___________________________.       _________________________________________________________ If you have any questions, please contact our office [phone].                    Sincerely,    Caroline Prom FNP [facility]

## 2010-06-27 ENCOUNTER — Encounter (INDEPENDENT_AMBULATORY_CARE_PROVIDER_SITE_OTHER): Payer: Self-pay | Admitting: Nurse Practitioner

## 2010-06-28 NOTE — Letter (Signed)
Summary: STEVEN BERNSTORF,O.D.P.A  STEVEN BERNSTORF,O.D.P.A   Imported By: Arta Bruce 06/05/2010 11:59:02  _____________________________________________________________________  External Attachment:    Type:   Image     Comment:   External Document

## 2010-06-28 NOTE — Letter (Signed)
Summary: Caroline Harvey,OD.,PA   Imported By: Arta Bruce 06/21/2010 15:03:06  _____________________________________________________________________  External Attachment:    Type:   Image     Comment:   External Document

## 2010-06-28 NOTE — Progress Notes (Signed)
Summary: Glaucoma suspect  Phone Note Outgoing Call   Summary of Call: Reviewing an old record from Dr. Wynona Luna, O.D. for eye exam 11/2008 and noted on her exam to have optic nerv cupping, though IOP wee 19 mm bilaterally with tonometry.  Recommendation that she be referred for glaucoma evaluation and follow up with him in a year.  Called pt., but no identifiers in voice message.  Left message with Dr. Eugene Garnet office questioning whether she followed up this past fall and if he has any ideas where she can get glaucoma evaluation inexpensively as we do not have good options through our clinic.  Await his call.   Initial call taken by: Julieanne Manson MD,  May 28, 2010 12:02 PM  Follow-up for Phone Call        Called Dr. Eugene Garnet office again. Pt. has not followed up with them. Cost of having glaucoma evaluation around $350 and they do not allow payment plan. Notified his office that we do not have great options for people to get eye evaluation as well.  Called pt. --she has not followed up anywhere else.  Does wear bifocals.   She did run out of eligibility, which is part of her  reasoning for  why she has not pursued further evaluation, though denied hx of eye problems at last CPP this year. Will see if can get in somewhere where can get a payment plan. Nora--eye referral made--can you see if eye physician willing to do glaucoma evaluation with payment plan ? Please send Dr. Eugene Garnet note with order. Follow-up by: Julieanne Manson MD,  May 31, 2010 9:06 AM  Additional Follow-up for Phone Call Additional follow up Details #1::        Unfortunately without her orange card I can refer her to [facility] and I called the pt these morning a lady answer the phone and told me that she don't live there .and I called her back right now and I leave a message in her answer machine to call me back  Additional Follow-up by: Cheryll Dessert,  May 31, 2010 7:16 PM  New  Problems: GLAUCOMA SUSPECT (ICD-365.00)   Additional Follow-up for Phone Call Additional follow up Details #2::    Pt came to the office today and give me a copy of her orange card and I will refer her to [facility] and they send the referral to Dr Dione Booze .they notify the pt about appt date & time . Follow-up by: Cheryll Dessert,  June 04, 2010 11:16 AM  New Problems: GLAUCOMA SUSPECT (ICD-365.00)

## 2010-07-12 ENCOUNTER — Encounter (INDEPENDENT_AMBULATORY_CARE_PROVIDER_SITE_OTHER): Payer: Self-pay | Admitting: Nurse Practitioner

## 2010-07-18 NOTE — Miscellaneous (Signed)
Summary: Eye Exam   Diabetes Management History:      She says that she is exercising.  Type of exercise includes: walking.    Diabetes Management Exam:    Eye Exam:       Eye Exam done elsewhere          Date: 06/27/2010          Results: high risk glaucoma suspect and does not need treatment          Done by: Earley Brooke   Diabetes Management Assessment/Plan:      Her blood pressure goal is < 140/90.   Clinical Lists Changes  Observations: Added new observation of EYES COMMENT: 06/2011 (07/12/2010 14:00) Added new observation of EYE EXAM BY: [facility]  (06/27/2010 14:02) Added new observation of DMEYEEXMRES: high risk glaucoma suspect and does not need treatment (06/27/2010 14:02) Added new observation of DIAB EYE EX: high risk glaucoma suspect and does not need treatment (06/27/2010 14:02)

## 2010-07-18 NOTE — Letter (Signed)
Summary: [facility]   Imported By: Arta Bruce 07/12/2010 15:07:28  _____________________________________________________________________  External Attachment:    Type:   Image     Comment:   External Document

## 2010-08-07 ENCOUNTER — Encounter (INDEPENDENT_AMBULATORY_CARE_PROVIDER_SITE_OTHER): Payer: Self-pay | Admitting: Nurse Practitioner

## 2010-08-07 ENCOUNTER — Telehealth (INDEPENDENT_AMBULATORY_CARE_PROVIDER_SITE_OTHER): Payer: Self-pay | Admitting: Nurse Practitioner

## 2010-08-14 NOTE — Assessment & Plan Note (Signed)
Summary: Lab Order  Nurse Visit   Allergies: 1)  ! [facility] Ibuprofen (Ibuprofen)  Medication Administration  Injection # 1:    Medication: Vit B12 1000 mcg    Diagnosis: B12 DEFICIENCY (ICD-266.2)    Route: IM    Site: LUOQ gluteus    Exp Date: 10/2011    Lot #: [id number]    Mfr: American Regent    Comments: [id number]    Patient tolerated injection without complications    Given by: [place] Shannon (August 07, 2010 11:41 AM)  Orders Added: 1)  Admin of Therapeutic Inj  intramuscular or subcutaneous [96372] 2)  Vit B12 1000 mcg [J3420]     Medication Administration  Injection # 1:    Medication: Vit B12 1000 mcg    Diagnosis: B12 DEFICIENCY (ICD-266.2)    Route: IM    Site: LUOQ gluteus    Exp Date: 10/2011    Lot #: [id number]    Mfr: American Regent    Comments: [id number]    Patient tolerated injection without complications    Given by: Armenia Shannon (August 07, 2010 11:41 AM)  Orders Added: 1)  Admin of Therapeutic Inj  intramuscular or subcutaneous [96372] 2)  Vit B12 1000 mcg [J3420]

## 2010-08-14 NOTE — Progress Notes (Signed)
Summary: Wants viagra  Phone Note Call from Patient   Summary of Call: Mrs. Nuss came in for her b-12 injection and would like to know can she get an rx for viagra for women.... pt replied she is trying to get her groove on Initial call taken by: Armenia Shannon,  August 07, 2010 11:49 AM  Follow-up for Phone Call        Sent to N. Daphine Deutscher -- please advise re response to pt.  Dutch Quint RN  August 07, 2010 3:56 PM   Additional Follow-up for Phone Call Additional follow up Details #1::        sorry no viagra for women offered in this office. Pharmacy does not provide and further more has not provide definately the same benefits in women as men.  It is not widely used yet Additional Follow-up by: Lehman Prom FNP,  August 08, 2010 11:24 AM    Additional Follow-up for Phone Call Additional follow up Details #2::    Left message on answering machine for pt. to return call.  Dutch Quint RN  August 08, 2010 1:05 PM  Pt. advised of provider's response -- also advised of average cost of pills -- verbalized understanding.  Dutch Quint RN  August 09, 2010 2:56 PM

## 2010-08-27 LAB — COMPREHENSIVE METABOLIC PANEL
ALT: 9 U/L (ref 0–35)
AST: 13 U/L (ref 0–37)
Albumin: 3.5 g/dL (ref 3.5–5.2)
Alkaline Phosphatase: 32 U/L — ABNORMAL LOW (ref 39–117)
GFR calc Af Amer: >60 mL/min (ref >60)
Glucose, Bld: 104 mg/dL — ABNORMAL HIGH (ref 70–99)
Potassium: 3.3 mEq/L — ABNORMAL LOW (ref 3.5–5.1)
Sodium: 138 mEq/L (ref 135–145)
Total Protein: 6.6 g/dL (ref 6.0–8.3)

## 2010-08-27 LAB — DIFFERENTIAL
Basophils Relative: 1 % (ref 0–1)
Eosinophils Absolute: 0 10*3/uL (ref 0.0–0.7)
Eosinophils Relative: 0 % (ref 0–5)
Lymphocytes Relative: 30 % (ref 12–46)
Monocytes Absolute: 0.7 10*3/uL (ref 0.1–1.0)
Neutro Abs: 5.7 10*3/uL (ref 1.7–7.7)

## 2010-08-27 LAB — URINALYSIS, MICROSCOPIC ONLY
Bilirubin Urine: NEGATIVE
Glucose, UA: NEGATIVE mg/dL
Ketones, ur: NEGATIVE mg/dL
Protein, ur: NEGATIVE mg/dL
pH: 6 (ref 5.0–8.0)

## 2010-08-27 LAB — PROTIME-INR
INR: 0.94 (ref 0.00–1.49)
INR: 1.01 (ref 0.00–1.49)
Prothrombin Time: 13.2 seconds (ref 11.6–15.2)

## 2010-08-27 LAB — CBC
Hemoglobin: 12.6 g/dL (ref 12.0–15.0)
MCHC: 33.5 g/dL (ref 30.0–36.0)
MCV: 97.9 fL (ref 78.0–100.0)
RBC: 3.84 MIL/uL — ABNORMAL LOW (ref 3.87–5.11)

## 2010-08-27 LAB — C-REACTIVE PROTEIN: CRP: 15.5 mg/dL — ABNORMAL HIGH (ref <0.6)

## 2011-01-07 ENCOUNTER — Other Ambulatory Visit: Payer: Self-pay | Admitting: Internal Medicine

## 2011-01-07 DIAGNOSIS — Z1231 Encounter for screening mammogram for malignant neoplasm of breast: Secondary | ICD-10-CM

## 2011-01-08 ENCOUNTER — Ambulatory Visit (HOSPITAL_COMMUNITY)
Admission: RE | Admit: 2011-01-08 | Discharge: 2011-01-08 | Disposition: A | Discharge status: Home / Self Care | Payer: Self-pay | Source: Ambulatory Visit | Attending: Internal Medicine | Admitting: Internal Medicine

## 2011-01-08 DIAGNOSIS — M79609 Pain in unspecified limb: Secondary | ICD-10-CM | POA: Insufficient documentation

## 2011-01-08 DIAGNOSIS — M7989 Other specified soft tissue disorders: Secondary | ICD-10-CM | POA: Insufficient documentation

## 2011-01-17 ENCOUNTER — Ambulatory Visit (HOSPITAL_COMMUNITY): Payer: Self-pay

## 2011-01-18 ENCOUNTER — Ambulatory Visit (HOSPITAL_COMMUNITY)
Admission: RE | Admit: 2011-01-18 | Discharge: 2011-01-18 | Disposition: A | Discharge status: Home / Self Care | Payer: Self-pay | Source: Ambulatory Visit | Attending: Internal Medicine | Admitting: Internal Medicine

## 2011-01-18 DIAGNOSIS — Z1231 Encounter for screening mammogram for malignant neoplasm of breast: Secondary | ICD-10-CM | POA: Insufficient documentation

## 2012-11-05 ENCOUNTER — Other Ambulatory Visit (HOSPITAL_COMMUNITY): Payer: Self-pay | Admitting: Internal Medicine

## 2012-11-05 DIAGNOSIS — Z1231 Encounter for screening mammogram for malignant neoplasm of breast: Secondary | ICD-10-CM

## 2012-11-12 ENCOUNTER — Ambulatory Visit (HOSPITAL_COMMUNITY)
Admission: RE | Admit: 2012-11-12 | Discharge: 2012-11-12 | Disposition: A | Discharge status: Home / Self Care | Payer: BC Managed Care – PPO | Source: Ambulatory Visit | Attending: Internal Medicine | Admitting: Internal Medicine

## 2012-11-12 DIAGNOSIS — Z1231 Encounter for screening mammogram for malignant neoplasm of breast: Secondary | ICD-10-CM | POA: Insufficient documentation

## 2012-11-19 ENCOUNTER — Other Ambulatory Visit (HOSPITAL_COMMUNITY): Payer: Self-pay | Admitting: Internal Medicine

## 2012-11-19 ENCOUNTER — Ambulatory Visit (HOSPITAL_COMMUNITY)
Admission: RE | Admit: 2012-11-19 | Discharge: 2012-11-19 | Disposition: A | Discharge status: Home / Self Care | Payer: BC Managed Care – PPO | Source: Ambulatory Visit | Attending: Internal Medicine | Admitting: Internal Medicine

## 2012-11-19 DIAGNOSIS — M25552 Pain in left hip: Secondary | ICD-10-CM

## 2012-11-19 DIAGNOSIS — M25551 Pain in right hip: Secondary | ICD-10-CM

## 2012-11-19 DIAGNOSIS — M549 Dorsalgia, unspecified: Secondary | ICD-10-CM

## 2012-11-19 DIAGNOSIS — M51379 Other intervertebral disc degeneration, lumbosacral region without mention of lumbar back pain or lower extremity pain: Secondary | ICD-10-CM | POA: Insufficient documentation

## 2012-11-19 DIAGNOSIS — M5137 Other intervertebral disc degeneration, lumbosacral region: Secondary | ICD-10-CM | POA: Insufficient documentation

## 2012-11-19 DIAGNOSIS — M25559 Pain in unspecified hip: Secondary | ICD-10-CM | POA: Insufficient documentation

## 2012-11-19 DIAGNOSIS — IMO0002 Reserved for concepts with insufficient information to code with codable children: Secondary | ICD-10-CM | POA: Insufficient documentation

## 2013-01-20 ENCOUNTER — Emergency Department (HOSPITAL_COMMUNITY)
Admission: EM | Admit: 2013-01-20 | Discharge: 2013-01-20 | Disposition: A | Discharge status: Home / Self Care | Payer: BC Managed Care – PPO | Source: Home / Self Care | Attending: Emergency Medicine | Admitting: Emergency Medicine

## 2013-01-20 ENCOUNTER — Emergency Department (INDEPENDENT_AMBULATORY_CARE_PROVIDER_SITE_OTHER): Payer: BC Managed Care – PPO

## 2013-01-20 ENCOUNTER — Encounter (HOSPITAL_COMMUNITY): Payer: Self-pay | Admitting: Emergency Medicine

## 2013-01-20 DIAGNOSIS — R05 Cough: Secondary | ICD-10-CM

## 2013-01-20 DIAGNOSIS — R059 Cough, unspecified: Secondary | ICD-10-CM

## 2013-01-20 HISTORY — DX: Essential (primary) hypertension: I10

## 2013-01-20 MED ORDER — GUAIFENESIN-CODEINE 200-10 MG/5ML PO LIQD: 5.0000 mL | Freq: Two times a day (BID) | ORAL | Status: DC | PRN

## 2013-01-20 MED ORDER — CETIRIZINE HCL 10 MG PO TABS: 10.0000 mg | ORAL_TABLET | Freq: Every day | ORAL | Status: DC

## 2013-01-20 NOTE — ED Provider Notes (Signed)
CSN: [id number]     Arrival date & time 01/20/13  1804 History   First MD Initiated Contact with Patient 01/20/13 1906     Chief Complaint  Patient presents with  . Cough   (Consider location/radiation/quality/duration/timing/severity/associated sxs/prior Treatment) HPI Patient is a 52 yo F presenting with cough x6 weeks. Cough is worse at night. She has to prop herself up at night to help her breathe. Last night she was more congested and had a headache. She states the cough has been every day, but congestion/feeling bad comes and goes. She states she coughs up clear mucus with the cough. She has tried mucinex and OTC cough medication which helps a little. She reports wheezing, no fevers. No known sick contacts.   She has history of HTN, osteoarthritis. Denies heart problems, denies history of asthma.   Past Medical History  Diagnosis Date  . Hypertension    History reviewed. No pertinent past surgical history. No family history on file. History  Substance Use Topics  . Smoking status: Never Smoker   . Smokeless tobacco: Not on file  . Alcohol Use: No   OB History   Grav Para Term Preterm Abortions TAB SAB Ect Mult Living                 Review of Systems  Constitutional: Negative for fever, chills and fatigue.  HENT: Positive for congestion, rhinorrhea, voice change and postnasal drip. Negative for sore throat, sneezing, trouble swallowing, neck pain, neck stiffness and dental problem.   Eyes: Negative for visual disturbance.  Respiratory: Positive for cough, shortness of breath and wheezing.   Cardiovascular: Negative for chest pain and leg swelling.  Gastrointestinal: Negative for abdominal pain.  Genitourinary: Negative for dysuria.  Musculoskeletal: Positive for back pain and arthralgias. Negative for myalgias.  Skin: Negative for rash.  Neurological: Positive for headaches. Negative for light-headedness.    Allergies  Review of patient's allergies indicates no known  allergies.  Home Medications   Current Outpatient Rx  Name  Route  Sig  Dispense  Refill  . HYDROCHLOROTHIAZIDE PO   Oral   Take by mouth.         Marland Kitchen LISINOPRIL PO   Oral   Take by mouth.         . Pantoprazole Sodium (PROTONIX PO)   Oral   Take by mouth.         . cetirizine (ZYRTEC) 10 MG tablet   Oral   Take 1 tablet (10 mg total) by mouth daily.   30 tablet   0   . Guaifenesin-Codeine 200-10 MG/5ML LIQD   Oral   Take 5 mLs by mouth 2 (two) times daily as needed.   473 mL   0   . Hydrocodone-Acetaminophen (VICODIN PO)   Oral   Take by mouth.          BP 138/68  Pulse 87  Temp(Src) 97.6 F (36.4 C) (Oral)  Resp 24  SpO2 100%  LMP 12/14/2010 Physical Exam  Constitutional: She is oriented to person, place, and time. She appears well-developed and well-nourished. No distress.  HENT:  Head: Normocephalic and atraumatic.  Mouth/Throat: Mucous membranes are normal. Posterior oropharyngeal erythema present. No posterior oropharyngeal edema.  Neck: Normal range of motion. Neck supple.  Obvious bilateral swelling of lowe neck extending over clavicles bilaterally  Cardiovascular: Normal rate, regular rhythm, normal heart sounds and intact distal pulses.   No murmur heard. Pulmonary/Chest: Effort normal and breath sounds normal. No  respiratory distress. She has no wheezes. She has no rales.  Abdominal: Soft. There is no tenderness.  Musculoskeletal: Normal range of motion. She exhibits no edema.  Lymphadenopathy:    She has no cervical adenopathy.  Neurological: She is alert and oriented to person, place, and time.  Skin: Skin is warm and dry. No rash noted.    ED Course  Procedures (including critical care time) Labs Review Labs Reviewed - No data to display Imaging Review Dg Chest 2 View  01/20/2013   *RADIOLOGY REPORT*  Clinical Data:  Chronic cough, congestion and shortness of breath.  CHEST - 2 VIEW  Comparison: 05/21/2009  Findings: The heart size  and mediastinal contours are within normal limits.  Both lungs are clear.  The visualized skeletal structures are unremarkable.  IMPRESSION: No active disease.   Original Report Authenticated By: Irish Lack, M.D.   MDM   1. Cough    52 yo F with persistent cough. DDx includes allergies, new heart failure, lisinopril use or habitual cough. CXR wnl. Blood pressure controlled. - Will treat as allergic for now with Zyrtec and guaifenesin-codeine as needed for cough. - F/u with PCP within one week to discuss stopping Lisinopril - RTC for worsening cough, hemoptysis, SOB, or overall fatigue.     Hilarie Fredrickson, MD 01/20/13 2017

## 2013-01-20 NOTE — ED Provider Notes (Signed)
Medical screening examination/treatment/procedure(s) were performed by Resident-physician practitioner and as supervising physician I was immediately available for consultation/collaboration.  Raynald Blend, MD 01/20/13 2055

## 2013-01-20 NOTE — ED Notes (Signed)
Pt c/o productive cough onset July Has tried OTC cough/cold meds Sxs include: chest pain due to cough Reports being on lisinopril and HCTZ onset 1 month Alert w/no signs of acute distress.

## 2013-02-11 ENCOUNTER — Other Ambulatory Visit: Payer: Self-pay | Admitting: Internal Medicine

## 2013-02-11 DIAGNOSIS — M5137 Other intervertebral disc degeneration, lumbosacral region: Secondary | ICD-10-CM

## 2013-03-06 ENCOUNTER — Other Ambulatory Visit: Payer: BC Managed Care – PPO

## 2013-04-06 ENCOUNTER — Ambulatory Visit (HOSPITAL_BASED_OUTPATIENT_CLINIC_OR_DEPARTMENT_OTHER): Payer: BC Managed Care – PPO

## 2014-09-25 DIAGNOSIS — E871 Hypo-osmolality and hyponatremia: Secondary | ICD-10-CM

## 2014-09-25 HISTORY — DX: Hypo-osmolality and hyponatremia: E87.1

## 2014-10-03 ENCOUNTER — Inpatient Hospital Stay (HOSPITAL_COMMUNITY)
Admission: EM | Admit: 2014-10-03 | Discharge: 2014-10-07 | DRG: 641 | Disposition: A | Discharge status: Home / Self Care | Payer: 59 | Attending: Internal Medicine | Admitting: Internal Medicine

## 2014-10-03 ENCOUNTER — Encounter (HOSPITAL_COMMUNITY): Payer: Self-pay | Admitting: Emergency Medicine

## 2014-10-03 ENCOUNTER — Other Ambulatory Visit (HOSPITAL_COMMUNITY): Payer: Self-pay

## 2014-10-03 DIAGNOSIS — E860 Dehydration: Secondary | ICD-10-CM | POA: Diagnosis present

## 2014-10-03 DIAGNOSIS — D638 Anemia in other chronic diseases classified elsewhere: Secondary | ICD-10-CM | POA: Diagnosis present

## 2014-10-03 DIAGNOSIS — D72819 Decreased white blood cell count, unspecified: Secondary | ICD-10-CM | POA: Diagnosis not present

## 2014-10-03 DIAGNOSIS — E871 Hypo-osmolality and hyponatremia: Secondary | ICD-10-CM | POA: Diagnosis not present

## 2014-10-03 DIAGNOSIS — I10 Essential (primary) hypertension: Secondary | ICD-10-CM | POA: Diagnosis present

## 2014-10-03 DIAGNOSIS — K279 Peptic ulcer, site unspecified, unspecified as acute or chronic, without hemorrhage or perforation: Secondary | ICD-10-CM | POA: Diagnosis present

## 2014-10-03 DIAGNOSIS — R112 Nausea with vomiting, unspecified: Secondary | ICD-10-CM | POA: Diagnosis present

## 2014-10-03 DIAGNOSIS — E162 Hypoglycemia, unspecified: Secondary | ICD-10-CM | POA: Diagnosis present

## 2014-10-03 DIAGNOSIS — K2970 Gastritis, unspecified, without bleeding: Secondary | ICD-10-CM | POA: Diagnosis present

## 2014-10-03 DIAGNOSIS — Z6841 Body Mass Index (BMI) 40.0 and over, adult: Secondary | ICD-10-CM

## 2014-10-03 DIAGNOSIS — R197 Diarrhea, unspecified: Secondary | ICD-10-CM

## 2014-10-03 DIAGNOSIS — N179 Acute kidney failure, unspecified: Secondary | ICD-10-CM | POA: Diagnosis not present

## 2014-10-03 DIAGNOSIS — K219 Gastro-esophageal reflux disease without esophagitis: Secondary | ICD-10-CM | POA: Diagnosis present

## 2014-10-03 DIAGNOSIS — F1010 Alcohol abuse, uncomplicated: Secondary | ICD-10-CM | POA: Diagnosis not present

## 2014-10-03 DIAGNOSIS — Z9114 Patient's other noncompliance with medication regimen: Secondary | ICD-10-CM | POA: Diagnosis present

## 2014-10-03 HISTORY — DX: Hypo-osmolality and hyponatremia: E87.1

## 2014-10-03 HISTORY — DX: Alcohol abuse, uncomplicated: F10.10

## 2014-10-03 HISTORY — DX: Anemia, unspecified: D64.9

## 2014-10-03 LAB — BASIC METABOLIC PANEL WITH GFR
Anion gap: 13 (ref 5–15)
BUN: 14 mg/dL (ref 6–20)
CO2: 19 mmol/L — ABNORMAL LOW (ref 22–32)
Calcium: 8.6 mg/dL — ABNORMAL LOW (ref 8.9–10.3)
Chloride: 86 mmol/L — ABNORMAL LOW (ref 101–111)
Creatinine, Ser: 1.14 mg/dL — ABNORMAL HIGH (ref 0.44–1.00)
GFR calc Af Amer: >60 mL/min
GFR calc non Af Amer: 54 mL/min — ABNORMAL LOW
Glucose, Bld: 82 mg/dL (ref 70–99)
Potassium: 3.6 mmol/L (ref 3.5–5.1)
Sodium: 118 mmol/L — CL (ref 135–145)

## 2014-10-03 LAB — BASIC METABOLIC PANEL
Anion gap: 12 (ref 5–15)
BUN: 14 mg/dL (ref 6–20)
CO2: 21 mmol/L — ABNORMAL LOW (ref 22–32)
Calcium: 8.7 mg/dL — ABNORMAL LOW (ref 8.9–10.3)
Chloride: 84 mmol/L — ABNORMAL LOW (ref 101–111)
Creatinine, Ser: 1.26 mg/dL — ABNORMAL HIGH (ref 0.44–1.00)
GFR, EST AFRICAN AMERICAN: 56 mL/min — AB (ref >60)
GFR, EST NON AFRICAN AMERICAN: 48 mL/min — AB (ref >60)
Glucose, Bld: 65 mg/dL — ABNORMAL LOW (ref 70–99)
POTASSIUM: 3.9 mmol/L (ref 3.5–5.1)
Sodium: 117 mmol/L — CL (ref 135–145)

## 2014-10-03 LAB — CBC WITH DIFFERENTIAL/PLATELET
Basophils Absolute: 0 K/uL (ref 0.0–0.1)
Basophils Relative: 0 % (ref 0–1)
Eosinophils Absolute: 0.1 K/uL (ref 0.0–0.7)
Eosinophils Relative: 2 % (ref 0–5)
HCT: 35.2 % — ABNORMAL LOW (ref 36.0–46.0)
Hemoglobin: 12.4 g/dL (ref 12.0–15.0)
Lymphocytes Relative: 33 % (ref 12–46)
Lymphs Abs: 1 K/uL (ref 0.7–4.0)
MCH: 32.1 pg (ref 26.0–34.0)
MCHC: 35.2 g/dL (ref 30.0–36.0)
MCV: 91.2 fL (ref 78.0–100.0)
Monocytes Absolute: 0.6 K/uL (ref 0.1–1.0)
Monocytes Relative: 20 % — ABNORMAL HIGH (ref 3–12)
Neutro Abs: 1.4 K/uL — ABNORMAL LOW (ref 1.7–7.7)
Neutrophils Relative %: 45 % (ref 43–77)
Platelets: 223 K/uL (ref 150–400)
RBC: 3.86 MIL/uL — ABNORMAL LOW (ref 3.87–5.11)
RDW: 13.7 % (ref 11.5–15.5)
WBC: 3 K/uL — ABNORMAL LOW (ref 4.0–10.5)

## 2014-10-03 LAB — URINALYSIS, ROUTINE W REFLEX MICROSCOPIC
Bilirubin Urine: NEGATIVE
Glucose, UA: NEGATIVE mg/dL
Hgb urine dipstick: NEGATIVE
Ketones, ur: NEGATIVE mg/dL
Leukocytes, UA: NEGATIVE
Nitrite: NEGATIVE
Protein, ur: NEGATIVE mg/dL
Specific Gravity, Urine: 1.008 (ref 1.005–1.030)
Urobilinogen, UA: 0.2 mg/dL (ref 0.0–1.0)
pH: 5 (ref 5.0–8.0)

## 2014-10-03 LAB — COMPREHENSIVE METABOLIC PANEL
ALT: 26 U/L (ref 14–54)
ANION GAP: 9 (ref 5–15)
AST: 37 U/L (ref 15–41)
Albumin: 3.6 g/dL (ref 3.5–5.0)
Alkaline Phosphatase: 64 U/L (ref 38–126)
BUN: 14 mg/dL (ref 6–20)
CALCIUM: 8.6 mg/dL — AB (ref 8.9–10.3)
CO2: 22 mmol/L (ref 22–32)
CREATININE: 1.32 mg/dL — AB (ref 0.44–1.00)
Chloride: 85 mmol/L — ABNORMAL LOW (ref 101–111)
GFR calc Af Amer: 53 mL/min — ABNORMAL LOW (ref >60)
GFR, EST NON AFRICAN AMERICAN: 45 mL/min — AB (ref >60)
GLUCOSE: 78 mg/dL (ref 70–99)
Potassium: 3.7 mmol/L (ref 3.5–5.1)
Sodium: 116 mmol/L — CL (ref 135–145)
Total Bilirubin: 0.8 mg/dL (ref 0.3–1.2)
Total Protein: 6.3 g/dL — ABNORMAL LOW (ref 6.5–8.1)

## 2014-10-03 LAB — I-STAT CG4 LACTIC ACID, ED
LACTIC ACID, VENOUS: 2.24 mmol/L — AB (ref 0.5–2.0)
Lactic Acid, Venous: 1.34 mmol/L (ref 0.5–2.0)

## 2014-10-03 LAB — GLUCOSE, CAPILLARY: Glucose-Capillary: 63 mg/dL — ABNORMAL LOW (ref 70–99)

## 2014-10-03 LAB — ETHANOL: Alcohol, Ethyl (B): <5 mg/dL (ref <5)

## 2014-10-03 LAB — RAPID URINE DRUG SCREEN, HOSP PERFORMED
Amphetamines: NOT DETECTED
Barbiturates: NOT DETECTED
Benzodiazepines: NOT DETECTED
Cocaine: NOT DETECTED
OPIATES: POSITIVE — AB
Tetrahydrocannabinol: NOT DETECTED

## 2014-10-03 LAB — SODIUM, URINE, RANDOM: Sodium, Ur: 21 mmol/L

## 2014-10-03 LAB — LIPASE, BLOOD: Lipase: 39 U/L (ref 22–51)

## 2014-10-03 LAB — OSMOLALITY, URINE: Osmolality, Ur: 186 mosm/kg — ABNORMAL LOW (ref 390–1090)

## 2014-10-03 LAB — CREATININE, URINE, RANDOM: CREATININE, URINE: 61.12 mg/dL

## 2014-10-03 LAB — CBG MONITORING, ED: Glucose-Capillary: 55 mg/dL — ABNORMAL LOW (ref 70–99)

## 2014-10-03 MED ORDER — ACETAMINOPHEN 325 MG PO TABS: 650.0000 mg | ORAL_TABLET | Freq: Four times a day (QID) | ORAL | Status: DC | PRN

## 2014-10-03 MED ORDER — MORPHINE SULFATE 2 MG/ML IJ SOLN: 1.0000 mg | INTRAMUSCULAR | Status: DC | PRN

## 2014-10-03 MED ORDER — LORAZEPAM 1 MG PO TABS
1.0000 mg | ORAL_TABLET | Freq: Four times a day (QID) | ORAL | Status: AC | PRN
  Administered 2014-10-04: 1 mg via ORAL
  Filled 2014-10-03 (×2): qty 1

## 2014-10-03 MED ORDER — VITAMIN B-1 100 MG PO TABS
100.0000 mg | ORAL_TABLET | Freq: Every day | ORAL | Status: DC
  Administered 2014-10-04 – 2014-10-07 (×4): 100 mg via ORAL
  Filled 2014-10-03 (×4): qty 1

## 2014-10-03 MED ORDER — PANTOPRAZOLE SODIUM 40 MG IV SOLR
40.0000 mg | INTRAVENOUS | Status: DC
  Administered 2014-10-03 – 2014-10-04 (×2): 40 mg via INTRAVENOUS
  Filled 2014-10-03 (×3): qty 40

## 2014-10-03 MED ORDER — SODIUM CHLORIDE 0.9 % IV BOLUS (SEPSIS)
1000.0000 mL | Freq: Once | INTRAVENOUS | Status: AC
  Administered 2014-10-03: 1000 mL via INTRAVENOUS

## 2014-10-03 MED ORDER — ALBUTEROL SULFATE (2.5 MG/3ML) 0.083% IN NEBU: 2.5000 mg | INHALATION_SOLUTION | RESPIRATORY_TRACT | Status: DC | PRN

## 2014-10-03 MED ORDER — ACETAMINOPHEN 650 MG RE SUPP: 650.0000 mg | Freq: Four times a day (QID) | RECTAL | Status: DC | PRN

## 2014-10-03 MED ORDER — SODIUM CHLORIDE 0.9 % IV BOLUS (SEPSIS): 1000.0000 mL | Freq: Once | INTRAVENOUS | Status: DC

## 2014-10-03 MED ORDER — DEXTROSE 50 % IV SOLN
1.0000 | Freq: Once | INTRAVENOUS | Status: AC
  Administered 2014-10-03: 50 mL via INTRAVENOUS
  Filled 2014-10-03: qty 50

## 2014-10-03 MED ORDER — ONDANSETRON HCL 4 MG/2ML IJ SOLN: 4.0000 mg | Freq: Four times a day (QID) | INTRAMUSCULAR | Status: DC | PRN

## 2014-10-03 MED ORDER — FOLIC ACID 1 MG PO TABS
1.0000 mg | ORAL_TABLET | Freq: Every day | ORAL | Status: DC
  Administered 2014-10-04 – 2014-10-07 (×4): 1 mg via ORAL
  Filled 2014-10-03 (×4): qty 1

## 2014-10-03 MED ORDER — ADULT MULTIVITAMIN W/MINERALS CH
1.0000 | ORAL_TABLET | Freq: Every day | ORAL | Status: DC
  Administered 2014-10-04 – 2014-10-07 (×4): 1 via ORAL
  Filled 2014-10-03 (×4): qty 1

## 2014-10-03 MED ORDER — SODIUM CHLORIDE 0.9 % IJ SOLN
3.0000 mL | Freq: Two times a day (BID) | INTRAMUSCULAR | Status: DC
  Administered 2014-10-03 – 2014-10-06 (×2): 3 mL via INTRAVENOUS

## 2014-10-03 MED ORDER — ENOXAPARIN SODIUM 40 MG/0.4ML ~~LOC~~ SOLN
40.0000 mg | SUBCUTANEOUS | Status: DC
  Administered 2014-10-04 – 2014-10-06 (×4): 40 mg via SUBCUTANEOUS
  Filled 2014-10-03 (×5): qty 0.4

## 2014-10-03 MED ORDER — LORAZEPAM 2 MG/ML IJ SOLN
1.0000 mg | Freq: Four times a day (QID) | INTRAMUSCULAR | Status: AC | PRN
  Administered 2014-10-05: 1 mg via INTRAVENOUS
  Filled 2014-10-03: qty 1

## 2014-10-03 MED ORDER — POTASSIUM CHLORIDE IN NACL 20-0.9 MEQ/L-% IV SOLN
INTRAVENOUS | Status: AC
  Administered 2014-10-03 – 2014-10-04 (×2): via INTRAVENOUS
  Filled 2014-10-03 (×2): qty 1000

## 2014-10-03 MED ORDER — ONDANSETRON HCL 4 MG PO TABS: 4.0000 mg | ORAL_TABLET | Freq: Four times a day (QID) | ORAL | Status: DC | PRN

## 2014-10-03 MED ORDER — ALUM & MAG HYDROXIDE-SIMETH 200-200-20 MG/5ML PO SUSP: 30.0000 mL | Freq: Four times a day (QID) | ORAL | Status: DC | PRN

## 2014-10-03 MED ORDER — THIAMINE HCL 100 MG/ML IJ SOLN
100.0000 mg | Freq: Every day | INTRAMUSCULAR | Status: DC
  Filled 2014-10-03 (×4): qty 1

## 2014-10-03 NOTE — ED Notes (Signed)
Pt sts generalized weakness and abd pain; pt noted to have SBP of 91; pt sts some vomiting last week

## 2014-10-03 NOTE — ED Provider Notes (Signed)
CSN: [id number]     Arrival date & time 10/03/14  1220 History   First MD Initiated Contact with Patient 10/03/14 1359     Chief Complaint  Patient presents with  . Weakness  . Dizziness     (Consider location/radiation/quality/duration/timing/severity/associated sxs/prior Treatment) The history is provided by the patient.  Caroline Harvey is a 54 y.o. female history of hypertension noncompliant with meds presenting with diffuse weakness, abdominal pain, vomiting. Patient has been vomiting for about a week. Several times a day with associated loose stools. Patient also just feels diffusely weak. Of note, patient has been drinking at least 5-7 beers daily for years. Patient was noted to be hypotensive at triage. Has not been seeing a doctor for a long time.    Past Medical History  Diagnosis Date  . Hypertension    History reviewed. No pertinent past surgical history. History reviewed. No pertinent family history. History  Substance Use Topics  . Smoking status: Never Smoker   . Smokeless tobacco: Not on file  . Alcohol Use: No   OB History    No data available     Review of Systems  Neurological: Positive for dizziness and weakness.  All other systems reviewed and are negative.     Allergies  Review of patient's allergies indicates no known allergies.  Home Medications   Prior to Admission medications   Medication Sig Start Date End Date Taking? Authorizing Provider  cetirizine (ZYRTEC) 10 MG tablet Take 1 tablet (10 mg total) by mouth daily. 01/20/13   Amber Fidel Levy, MD  Guaifenesin-Codeine 200-10 MG/5ML LIQD Take 5 mLs by mouth 2 (two) times daily as needed. 01/20/13   Amber Fidel Levy, MD  HYDROCHLOROTHIAZIDE PO Take by mouth.    Historical Provider, MD  Hydrocodone-Acetaminophen (VICODIN PO) Take by mouth.    Historical Provider, MD  LISINOPRIL PO Take by mouth.    Historical Provider, MD  Pantoprazole Sodium (PROTONIX PO) Take by mouth.    Historical Provider,  MD   BP 113/56 mmHg  Pulse 62  Temp(Src) 97.8 F (36.6 C) (Oral)  Resp 13  Ht 5\' 9"  (1.753 m)  Wt 287 lb (130.182 kg)  BMI 42.36 kg/m2  SpO2 100%  LMP 12/14/2010 Physical Exam  Constitutional: She is oriented to person, place, and time.  Dehydrated   HENT:  Head: Normocephalic.  MM dry   Eyes: Conjunctivae are normal. Pupils are equal, round, and reactive to light.  Neck: Normal range of motion. Neck supple.  Cardiovascular: Normal rate, regular rhythm and normal heart sounds.   Pulmonary/Chest: Effort normal and breath sounds normal. No respiratory distress. She has no wheezes. She has no rales.  Abdominal: Soft. Bowel sounds are normal. She exhibits no distension. There is no tenderness. There is no rebound.  No pulsatile mass   Musculoskeletal: Normal range of motion. She exhibits no edema or tenderness.  Neurological: She is alert and oriented to person, place, and time. No cranial nerve deficit. Coordination normal.  Skin: Skin is warm and dry.  Psychiatric: She has a normal mood and affect. Her behavior is normal. Judgment and thought content normal.  Nursing note and vitals reviewed.   ED Course  Procedures (including critical care time)  CRITICAL CARE Performed by: Darl Householder, Secret Kristensen   Total critical care time: 30 min   Critical care time was exclusive of separately billable procedures and treating other patients.  Critical care was necessary to treat or prevent imminent or life-threatening deterioration.  Critical care  was time spent personally by me on the following activities: development of treatment plan with patient and/or surrogate as well as nursing, discussions with consultants, evaluation of patient's response to treatment, examination of patient, obtaining history from patient or surrogate, ordering and performing treatments and interventions, ordering and review of laboratory studies, ordering and review of radiographic studies, pulse oximetry and re-evaluation  of patient's condition.   Labs Review Labs Reviewed  CBC WITH DIFFERENTIAL/PLATELET - Abnormal; Notable for the following:    WBC 3.0 (*)    RBC 3.86 (*)    HCT 35.2 (*)    Neutro Abs 1.4 (*)    Monocytes Relative 20 (*)    All other components within normal limits  COMPREHENSIVE METABOLIC PANEL - Abnormal; Notable for the following:    Sodium 116 (*)    Chloride 85 (*)    Creatinine, Ser 1.32 (*)    Calcium 8.6 (*)    Total Protein 6.3 (*)    GFR calc non Af Amer 45 (*)    GFR calc Af Amer 53 (*)    All other components within normal limits  LIPASE, BLOOD  URINALYSIS COMPLETEWITH MICROSCOPIC ([facility])   ETHANOL  URINE RAPID DRUG SCREEN (HOSP PERFORMED)  URINALYSIS, ROUTINE W REFLEX MICROSCOPIC  OSMOLALITY, URINE  CREATININE, URINE, RANDOM  SODIUM, URINE, RANDOM  OSMOLALITY  I-STAT CG4 LACTIC ACID, ED    Imaging Review No results found.   EKG Interpretation None      MDM   Final diagnoses:  None    Caroline Harvey is a 54 y.o. female here with weakness, hypotension. Consider dehydration from alcohol vs gastro vs sepsis. No signs of AAA. Will check labs and hydrate and reassess.   3:26 PM Na 116. No signs of seizures. Will place in seizure precaution. Given IVF as I think hyponatremia likely from dehydration and beer potomania. Will admit. I added serum and urine osmolality. Hospitalist request repeat BMP.    Wandra Arthurs, MD 10/03/14 [phone]

## 2014-10-03 NOTE — ED Notes (Signed)
Pt CBG 55, admitting MD made aware and new orders placed.

## 2014-10-03 NOTE — H&P (Addendum)
History and Physical  Molina Hollenback IRJ:[id number] DOB: 12-Feb-1961 DOA: 10/03/2014  Referring physician: Dr. Shirlyn Goltz, [place] PCP: No primary care provider on file.  Outpatient Specialists:  1. None  Chief Complaint: Nausea, vomiting, diarrhea, abdominal pain, dizziness and weakness  HPI: Caroline Harvey is a 54 y.o. female , single, lives alone, independent of activities of daily living, history of hypertension since 2009-noncompliant with medications, recently started taking a friend's HCTZ and lisinopril, chronic alcohol abuse, presented to [facility] ED on 10/03/14 with nausea, vomiting, diarrhea, abdominal pain, dizziness and weakness. Patient states that she has a new PCP appointment on 10/10/2014. She was in her usual state of health until approximately 2 weeks ago when she started experiencing nausea, several episodes of nonbloody emesis, poor appetite, diarrhea with each meal-watery without blood or mucus but no fevers or chills. The symptoms progressively worsened. She denies cyclic contacts, eating anything unusual or recent antibiotic use. She states that her abdominal soreness started after a few days from intense vomiting and diarrhea. She describes this as diffuse, dull aching, 6-7/10 in severity, no aggravating or relieving factors. Denies NSAID use. Has been using her friend's Percocet. She used some Pepto-Bismol a couple of days ago and since then her nausea, vomiting and diarrhea have significantly improved. She is able to tolerate liquid diet. She however continues to be weak and feels dizzy on standing. She denies passing out. She sustained an episode of fall 2 days ago when she missed a step but did not sustain significant injuries. She denies confusion or strokelike symptoms. Due to continued weakness, she presented to the ED where sodium 116 (confirmed by repeating), chloride 85, creatinine 132, WBC 3. Hospitalist admission was requested.   Review of Systems: All systems reviewed and  apart from history of presenting illness, are negative.  Past Medical History  Diagnosis Date  . Hypertension   . Alcohol abuse    History reviewed. No pertinent past surgical history. Social History:  reports that she has never smoked. She does not have any smokeless tobacco history on file. She reports that she drinks about 25.2 oz of alcohol per week. She reports that she does not use illicit drugs. Single. Lives alone. Independent of activities of daily living. States that she drinks up to 6-8, 16 ounce beers daily and has been doing so for years. Denies liquor use.  No Known Allergies  Family History  Problem Relation Age of Onset  . Hypertension Mother     Prior to Admission medications   Not on File   Physical Exam: Filed Vitals:   10/03/14 1257 10/03/14 1413 10/03/14 1730  BP: 91/59 113/56 135/58  Pulse: 94 62   Temp: 97.8 F (36.6 C)    TempSrc: Oral    Resp: 16 13   Height: 5\' 9"  (1.753 m)    Weight: 130.182 kg (287 lb)    SpO2: 100% 100%      General exam: Moderately built and obese female patient, lying comfortably supine on the gurney in no obvious distress.  Head, eyes and ENT: Nontraumatic and normocephalic. Pupils equally reacting to light and accommodation. Oral mucosa borderline hydration.  Neck: Supple. No JVD, carotid bruit or thyromegaly.  Lymphatics: No lymphadenopathy.  Respiratory system: Clear to auscultation. No increased work of breathing.  Cardiovascular system: S1 and S2 heard, RRR. No JVD, murmurs, gallops, clicks . Trace bilateral leg edema.  Gastrointestinal system: Abdomen is nondistended, soft. Diffuse minimal tenderness, especially in the epigastric region but without peritoneal  signs. Normal bowel sounds heard. No organomegaly or masses appreciated.  Central nervous system: Alert and oriented. No focal neurological deficits.  Extremities: Symmetric 5 x 5 power. Peripheral pulses symmetrically felt.   Skin: No rashes or acute  findings.  Musculoskeletal system: Negative exam.  Psychiatry: Pleasant and cooperative.   Labs on Admission:  Basic Metabolic Panel:  Recent Labs Lab 10/03/14 1314  NA 117*  116*  K 3.9  3.7  CL 84*  85*  CO2 21*  22  GLUCOSE 65*  78  BUN 14  14  CREATININE 1.26*  1.32*  CALCIUM 8.7*  8.6*   Liver Function Tests:  Recent Labs Lab 10/03/14 1314  AST 37  ALT 26  ALKPHOS 64  BILITOT 0.8  PROT 6.3*  ALBUMIN 3.6    Recent Labs Lab 10/03/14 1314  LIPASE 39   No results for input(s): AMMONIA in the last 168 hours. CBC:  Recent Labs Lab 10/03/14 1314  WBC 3.0*  NEUTROABS 1.4*  HGB 12.4  HCT 35.2*  MCV 91.2  PLT 223   Cardiac Enzymes: No results for input(s): CKTOTAL, CKMB, CKMBINDEX, TROPONINI in the last 168 hours.  BNP (last 3 results) No results for input(s): PROBNP in the last 8760 hours. CBG: No results for input(s): GLUCAP in the last 168 hours.  Radiological Exams on Admission: No results found.  EKG: No EKG seen in Epic for today. Will order  Assessment/Plan Principal Problem:   Hyponatremia - Baseline sodium not known. She could have chronic hyponatremia related to alcohol/beer abuse (beer potomania) - Multifactorial: Dehydration, beer potomania and HCTZ use - She may have subacute on chronic hyponatremia. Asymptomatic of neurological symptoms. - Admit to telemetry - DC HCTZ. Alcohol abstinence counseled. - Hydrate with IV normal saline and monitor BMP closely. Aim to correct sodium by 8-10 milliequivalents in a 24-hour period. - Seizure precautions.  Active Problems:   Nausea vomiting, diarrhea & abdominal pain - DD: Acute GE, gastritis, esophagitis, PUD - Self-limiting and seems to be improving - Check C. difficile PCR - Lipase normal - Start clear liquid diet and advance as tolerated - Treat supportively with IV PPI and IV fluids - BMP showed low blood sugar 1. Monitor CBGs.    Alcohol abuse - Moderation/cessation  counseled - Placed on CIWA protocol    HYPERTENSION, BENIGN - Currently presented with soft blood pressure due to volume depletion - Monitor closely post hydration    GERD - IV PPI    Leukopenia - Secondary to alcohol toxic effects - Monitor CBC    Acute kidney injury - Secondary to GI losses and dehydration - IV fluids and follow BMP   Code Status: Full  Family Communication: None at bedside  Disposition Plan: DC home when medically stable, possibly in 3-4 days   Time spent: 53 minutes  Caroline Alleman, MD, FACP, FHM. [facility] Pager [phone]  If 7PM-7AM, please contact night-coverage [email or web address] Password [email or web address] 10/03/2014, 5:40 PM

## 2014-10-03 NOTE — ED Notes (Signed)
Sodium-116 critical lab informed Caroline Harvey. Dr Darl Householder EDP notified.

## 2014-10-03 NOTE — ED Notes (Signed)
Attempted IV x1. Unable to access.

## 2014-10-03 NOTE — Progress Notes (Signed)
Td 6:59 PM Received pt to room 2w39 from ER.  Pt. Is without c/o at this time  Oreinted to room, call light and bed.  Call bell in reach [place]

## 2014-10-04 ENCOUNTER — Encounter (HOSPITAL_COMMUNITY): Payer: Self-pay | Admitting: General Practice

## 2014-10-04 DIAGNOSIS — D72819 Decreased white blood cell count, unspecified: Secondary | ICD-10-CM

## 2014-10-04 LAB — CBC
HCT: 29.8 % — ABNORMAL LOW (ref 36.0–46.0)
Hemoglobin: 10.6 g/dL — ABNORMAL LOW (ref 12.0–15.0)
MCH: 32.3 pg (ref 26.0–34.0)
MCHC: 35.6 g/dL (ref 30.0–36.0)
MCV: 90.9 fL (ref 78.0–100.0)
PLATELETS: 188 10*3/uL (ref 150–400)
RBC: 3.28 MIL/uL — ABNORMAL LOW (ref 3.87–5.11)
RDW: 13.5 % (ref 11.5–15.5)
WBC: 2.5 10*3/uL — ABNORMAL LOW (ref 4.0–10.5)

## 2014-10-04 LAB — BASIC METABOLIC PANEL
ANION GAP: 8 (ref 5–15)
Anion gap: 7 (ref 5–15)
Anion gap: 8 (ref 5–15)
Anion gap: 10 (ref 5–15)
Anion gap: 10 (ref 5–15)
BUN: 10 mg/dL (ref 6–20)
BUN: 10 mg/dL (ref 6–20)
BUN: 6 mg/dL (ref 6–20)
BUN: 11 mg/dL (ref 6–20)
BUN: 11 mg/dL (ref 6–20)
CALCIUM: 8.3 mg/dL — AB (ref 8.9–10.3)
CHLORIDE: 94 mmol/L — AB (ref 101–111)
CO2: 27 mmol/L (ref 22–32)
CO2: 23 mmol/L (ref 22–32)
CO2: 22 mmol/L (ref 22–32)
CO2: 19 mmol/L — AB (ref 22–32)
CO2: 22 mmol/L (ref 22–32)
CREATININE: 0.72 mg/dL (ref 0.44–1.00)
CREATININE: 0.73 mg/dL (ref 0.44–1.00)
CREATININE: 0.82 mg/dL (ref 0.44–1.00)
CREATININE: 0.9 mg/dL (ref 0.44–1.00)
Calcium: 8.7 mg/dL — ABNORMAL LOW (ref 8.9–10.3)
Calcium: 8.8 mg/dL — ABNORMAL LOW (ref 8.9–10.3)
Calcium: 9 mg/dL (ref 8.9–10.3)
Calcium: 8.6 mg/dL — ABNORMAL LOW (ref 8.9–10.3)
Chloride: 88 mmol/L — ABNORMAL LOW (ref 101–111)
Chloride: 91 mmol/L — ABNORMAL LOW (ref 101–111)
Chloride: 93 mmol/L — ABNORMAL LOW (ref 101–111)
Chloride: 90 mmol/L — ABNORMAL LOW (ref 101–111)
Creatinine, Ser: 0.6 mg/dL (ref 0.44–1.00)
GFR calc non Af Amer: >60 mL/min (ref >60)
GFR calc non Af Amer: >60 mL/min (ref >60)
GFR calc non Af Amer: >60 mL/min (ref >60)
GLUCOSE: 93 mg/dL (ref 70–99)
Glucose, Bld: 102 mg/dL — ABNORMAL HIGH (ref 70–99)
Glucose, Bld: 91 mg/dL (ref 70–99)
Glucose, Bld: 85 mg/dL (ref 70–99)
Glucose, Bld: 105 mg/dL — ABNORMAL HIGH (ref 70–99)
POTASSIUM: 4.3 mmol/L (ref 3.5–5.1)
POTASSIUM: 4.2 mmol/L (ref 3.5–5.1)
POTASSIUM: 5.2 mmol/L — AB (ref 3.5–5.1)
Potassium: 3.9 mmol/L (ref 3.5–5.1)
Potassium: 3.8 mmol/L (ref 3.5–5.1)
Sodium: 122 mmol/L — ABNORMAL LOW (ref 135–145)
Sodium: 121 mmol/L — ABNORMAL LOW (ref 135–145)
Sodium: 123 mmol/L — ABNORMAL LOW (ref 135–145)
Sodium: 123 mmol/L — ABNORMAL LOW (ref 135–145)
Sodium: 123 mmol/L — ABNORMAL LOW (ref 135–145)

## 2014-10-04 LAB — GLUCOSE, CAPILLARY
GLUCOSE-CAPILLARY: 93 mg/dL (ref 70–99)
Glucose-Capillary: 92 mg/dL (ref 70–99)
Glucose-Capillary: 68 mg/dL — ABNORMAL LOW (ref 70–99)
Glucose-Capillary: 84 mg/dL (ref 70–99)
Glucose-Capillary: 97 mg/dL (ref 70–99)

## 2014-10-04 LAB — HEPATIC FUNCTION PANEL
ALBUMIN: 3.3 g/dL — AB (ref 3.5–5.0)
ALT: 28 U/L (ref 14–54)
AST: 59 U/L — ABNORMAL HIGH (ref 15–41)
Alkaline Phosphatase: 61 U/L (ref 38–126)
BILIRUBIN INDIRECT: 0.8 mg/dL (ref 0.3–0.9)
Bilirubin, Direct: 0.4 mg/dL (ref 0.1–0.5)
TOTAL PROTEIN: 5.8 g/dL — AB (ref 6.5–8.1)
Total Bilirubin: 1.2 mg/dL (ref 0.3–1.2)

## 2014-10-04 LAB — RETICULOCYTES
RBC.: 3.74 MIL/uL — ABNORMAL LOW (ref 3.87–5.11)
RETIC COUNT ABSOLUTE: 37.4 10*3/uL (ref 19.0–186.0)
Retic Ct Pct: 1 % (ref 0.4–3.1)

## 2014-10-04 LAB — IRON AND TIBC
Iron: 156 ug/dL (ref 28–170)
Saturation Ratios: 54 % — ABNORMAL HIGH (ref 10.4–31.8)
TIBC: 288 ug/dL (ref 250–450)
UIBC: 132 ug/dL

## 2014-10-04 LAB — FERRITIN: Ferritin: 117 ng/mL (ref 11–307)

## 2014-10-04 LAB — TSH: TSH: 1.057 u[IU]/mL (ref 0.350–4.500)

## 2014-10-04 LAB — VITAMIN B12: Vitamin B-12: 585 pg/mL (ref 180–914)

## 2014-10-04 LAB — OSMOLALITY: Osmolality: 250 mOsm/kg — ABNORMAL LOW (ref 275–300)

## 2014-10-04 LAB — PREGNANCY, URINE: Preg Test, Ur: NEGATIVE

## 2014-10-04 LAB — FOLATE: FOLATE: 22.7 ng/mL (ref >5.9)

## 2014-10-04 MED ORDER — SODIUM CHLORIDE 0.9 % IV SOLN
INTRAVENOUS | Status: DC
  Administered 2014-10-04: 125 mL/h via INTRAVENOUS
  Administered 2014-10-05 – 2014-10-07 (×4): via INTRAVENOUS

## 2014-10-04 NOTE — Progress Notes (Addendum)
Hypoglycemic Event  CBG: 63  Treatment: 15 GM carbohydrate snack (apple juice)  Symptoms: None  Follow-up CBG: Time: CBG Result:93  Possible Reasons for Event: Inadequate meal intake  Comments/MD notified:no    Caroline Harvey Patient  Remember to initiate Hypoglycemia Order Set & complete

## 2014-10-04 NOTE — Evaluation (Signed)
Physical Therapy Evaluation Patient Details Name: Caroline Harvey MRN: [id number] DOB: Jan 12, 1961 Today's Date: 10/04/2014   History of Present Illness  Pt is a 54 y.o. female admitted for nausea, vomiting, diarrhea, abdominal pain, dizziness and weakness.  Clinical Impression  Pt is independent to mod I with all functional mobility. She does demo mild generalized weakness but no LOB or dizziness with hallway ambulation. Pt encouraged to ambulate for continued improvement with strength. No skilled PT intervention indicated. PT signing off.    Follow Up Recommendations No PT follow up    Equipment Recommendations  None recommended by PT    Recommendations for Other Services       Precautions / Restrictions Precautions Precautions: Other (comment) Precaution Comments: contact-enteric       Mobility  Bed Mobility Overal bed mobility: Independent                Transfers Overall transfer level: Independent                  Ambulation/Gait Ambulation/Gait assistance: Modified independent (Device/Increase time) Ambulation Distance (Feet): 150 Feet Assistive device: None Gait Pattern/deviations: Wide base of support;Step-through pattern;Decreased stride length   Gait velocity interpretation: Below normal speed for age/gender    Stairs            Wheelchair Mobility    Modified Rankin (Stroke Patients Only)       Balance Overall balance assessment: No apparent balance deficits (not formally assessed)                                           Pertinent Vitals/Pain Pain Assessment: No/denies pain    Home Living Family/patient expects to be discharged to:: Private residence Living Arrangements: Alone Available Help at Discharge: Friend(s);Family;Available PRN/intermittently Type of Home: House Home Access: Level entry     Home Layout: One level Home Equipment: None      Prior Function Level of Independence: Independent                Hand Dominance        Extremity/Trunk Assessment   Upper Extremity Assessment: Overall WFL for tasks assessed           Lower Extremity Assessment: Overall WFL for tasks assessed         Communication   Communication: No difficulties  Cognition Arousal/Alertness: Awake/alert Behavior During Therapy: WFL for tasks assessed/performed Overall Cognitive Status: Within Functional Limits for tasks assessed                      General Comments      Exercises        Assessment/Plan    PT Assessment Patent does not need any further PT services  PT Diagnosis Generalized weakness   PT Problem List    PT Treatment Interventions     PT Goals (Current goals can be found in the Care Plan section) Acute Rehab PT Goals Patient Stated Goal: to feel better PT Goal Formulation: All assessment and education complete, DC therapy    Frequency     Barriers to discharge        Co-evaluation               End of Session Equipment Utilized During Treatment: Gait belt Activity Tolerance: Patient tolerated treatment well Patient left: in chair;with call bell/phone within reach;with nursing/sitter  in room Nurse Communication: Mobility status         Time: 3358-2518 PT Time Calculation (min) (ACUTE ONLY): 18 min   Charges:   PT Evaluation $Initial PT Evaluation Tier I: 1 Procedure     PT G Codes:        Lorriane Shire 10/04/2014, 10:09 AM

## 2014-10-04 NOTE — Progress Notes (Addendum)
[facility] Progress Note   Caroline Harvey DTO:[id number] DOB: Oct 02, 1960 DOA: 10/03/2014 PCP: No primary care provider on file.  Brief narrative: Caroline Harvey is a 54 y.o. female with past medical history of hypertension currently not taking any medication, alcohol abuse including daily beer drinking who presents to the hospital with dizziness and weakness. She has had poor by mouth intake and has essentially been taking a limited amount of liquids for the past week. She states that when she tries solid food she develops vomiting. She has not noted any diarrhea or abdominal pain. She has been drinking 6-8 cans of 12 ounce beers for about 2 months now. No history of heavy drinking prior to this. Has never been told she has cirrhosis of the liver. She has had swelling of her ankles and states that she has been taking her friend's fluid pill (currently cannot recall the name) to help reduce the swelling. In the past she was taking lisinopril for hypertension but has not seen a physician in quite a while and therefore has had no prescriptions.   Subjective: Not feeling as dizzy or as lightheaded as yesterday. Currently no vomiting. Tolerating clear liquid diet. She's had a normal bowel movement this morning. No abdominal pain.  Assessment/Plan: Principal Problem:   Hyponatremia -Related to poor by mouth intake, use of "fluid pill" as outpatient which may have been HCTZ or Lasix and related to beer drinking -Sodium is improving with IV normal saline-symptoms resolving -Urine sodium and osmolality consistent with dehydration -Continue to check serial sodium levels  Active Problems:  Nausea and vomiting -No symptoms of diarrhea-current nausea may be secondary to hyponatremia-we'll slowly start to advance diet -if vomiting recurs-will do further workup including evaluation of her gallbladder-will obtain LFTs tomorrow morning-no epigastric tenderness or complaints of pain to suggest  gastritis or peptic ulcer disease-she has been placed on IV Protonix on admission  Hypoglycemia -Continue to follow sugars-increase by mouth intake    Alcohol abuse -Daily beer drinking-continue CIWA scale-has been advised to quit  Hypotension with a history of hypertension -Suspect she is volume depleted leading to hypotension at this time-continue to follow blood pressure    Leukopenia/anemia -Follow-obtain an anemia panel    Acute kidney injury -Creatinine was 1.26 on admission and has now normalized   Code Status: Full code Family Communication:  Disposition Plan: Home when sodium stable-she has an appointment with the PCP for next week DVT prophylaxis: Lovenox Consultants: Procedures:  Antibiotics: Anti-infectives    None      Objective: Filed Weights   10/03/14 1257 10/03/14 1846 10/04/14 0456  Weight: 130.182 kg (287 lb) 120.4 kg (265 lb 6.9 oz) 118 kg (260 lb 2.3 oz)    Intake/Output Summary (Last 24 hours) at 10/04/14 1357 Last data filed at 10/04/14 0731  Gross per 24 hour  Intake 803.33 ml  Output    600 ml  Net 203.33 ml     Vitals Filed Vitals:   10/03/14 1730 10/03/14 1846 10/03/14 2231 10/04/14 0456  BP: 135/58 106/63 123/71   Pulse:  86 82 89  Temp:  98 F (36.7 C) 98.2 F (36.8 C) 98.8 F (37.1 C)  TempSrc:  Oral Oral Oral  Resp:  18 18 18   Height:      Weight:  120.4 kg (265 lb 6.9 oz)  118 kg (260 lb 2.3 oz)  SpO2:  100% 100% 98%    Exam:  General:  Pt is alert, not in acute distress  HEENT: No  icterus, No thrush  Cardiovascular: regular rate and rhythm, S1/S2 No murmur  Respiratory: clear to auscultation bilaterally   Abdomen: Soft, +Bowel sounds, non tender, non distended, no guarding  MSK: No LE edema, cyanosis or clubbing  Data Reviewed: Basic Metabolic Panel:  Recent Labs Lab 10/03/14 1733 10/04/14 0010 10/04/14 0449 10/04/14 0830 10/04/14 1224  NA 118* 122* 121* 123* 123*  K 3.6 3.9 3.8 4.3 4.2  CL 86* 88*  91* 93* 90*  CO2 19* 27 22 22 23   GLUCOSE 82 102* 91 85 93  BUN 14 11 11 10 10   CREATININE 1.14* 0.90 0.82 0.73 0.72  CALCIUM 8.6* 8.7* 8.3* 8.8* 9.0   Liver Function Tests:  Recent Labs Lab 10/03/14 1314  AST 37  ALT 26  ALKPHOS 64  BILITOT 0.8  PROT 6.3*  ALBUMIN 3.6    Recent Labs Lab 10/03/14 1314  LIPASE 39   No results for input(s): AMMONIA in the last 168 hours. CBC:  Recent Labs Lab 10/03/14 1314 10/04/14 0449  WBC 3.0* 2.5*  NEUTROABS 1.4*  --   HGB 12.4 10.6*  HCT 35.2* 29.8*  MCV 91.2 90.9  PLT 223 188   Cardiac Enzymes: No results for input(s): CKTOTAL, CKMB, CKMBINDEX, TROPONINI in the last 168 hours. BNP (last 3 results) No results for input(s): BNP in the last 8760 hours.  ProBNP (last 3 results) No results for input(s): PROBNP in the last 8760 hours.  CBG:  Recent Labs Lab 10/03/14 2229 10/03/14 2342 10/04/14 0631 10/04/14 1135 10/04/14 1251  GLUCAP 63* 93 92 68* 84    No results found for this or any previous visit (from the past 240 hour(s)).   Studies: No results found.  Scheduled Meds:  Scheduled Meds: . enoxaparin (LOVENOX) injection  40 mg Subcutaneous Q24H  . folic acid  1 mg Oral Daily  . multivitamin with minerals  1 tablet Oral Daily  . pantoprazole (PROTONIX) IV  40 mg Intravenous Q24H  . sodium chloride  3 mL Intravenous Q12H  . thiamine  100 mg Oral Daily   Or  . thiamine  100 mg Intravenous Daily   Continuous Infusions:   Time spent on care of this patient: 35 minutes   [place], MD 10/04/2014, 1:57 PM  LOS: 1 day   [facility] Office  [phone] Pager - Text Page per [email or web address]  If 7PM-7AM, please contact night-coverage [email or web address]

## 2014-10-05 LAB — BASIC METABOLIC PANEL
ANION GAP: 9 (ref 5–15)
ANION GAP: 12 (ref 5–15)
Anion gap: 9 (ref 5–15)
BUN: <5 mg/dL — ABNORMAL LOW (ref 6–20)
BUN: <5 mg/dL — ABNORMAL LOW (ref 6–20)
BUN: <5 mg/dL — ABNORMAL LOW (ref 6–20)
CALCIUM: 8.6 mg/dL — AB (ref 8.9–10.3)
CHLORIDE: 95 mmol/L — AB (ref 101–111)
CHLORIDE: 94 mmol/L — AB (ref 101–111)
CO2: 24 mmol/L (ref 22–32)
CO2: 22 mmol/L (ref 22–32)
CO2: 21 mmol/L — ABNORMAL LOW (ref 22–32)
CREATININE: 0.59 mg/dL (ref 0.44–1.00)
CREATININE: 0.7 mg/dL (ref 0.44–1.00)
Calcium: 8.8 mg/dL — ABNORMAL LOW (ref 8.9–10.3)
Calcium: 8.7 mg/dL — ABNORMAL LOW (ref 8.9–10.3)
Chloride: 93 mmol/L — ABNORMAL LOW (ref 101–111)
Creatinine, Ser: 0.62 mg/dL (ref 0.44–1.00)
GFR calc Af Amer: >60 mL/min (ref >60)
GFR calc Af Amer: >60 mL/min (ref >60)
GFR calc non Af Amer: >60 mL/min (ref >60)
GFR calc non Af Amer: >60 mL/min (ref >60)
Glucose, Bld: 88 mg/dL (ref 70–99)
Glucose, Bld: 98 mg/dL (ref 70–99)
Glucose, Bld: 73 mg/dL (ref 70–99)
POTASSIUM: 4.3 mmol/L (ref 3.5–5.1)
Potassium: 4 mmol/L (ref 3.5–5.1)
Potassium: 4.9 mmol/L (ref 3.5–5.1)
Sodium: 126 mmol/L — ABNORMAL LOW (ref 135–145)
Sodium: 126 mmol/L — ABNORMAL LOW (ref 135–145)
Sodium: 127 mmol/L — ABNORMAL LOW (ref 135–145)

## 2014-10-05 LAB — GLUCOSE, CAPILLARY
GLUCOSE-CAPILLARY: 107 mg/dL — AB (ref 70–99)
GLUCOSE-CAPILLARY: 72 mg/dL (ref 70–99)
Glucose-Capillary: 82 mg/dL (ref 70–99)
Glucose-Capillary: 54 mg/dL — ABNORMAL LOW (ref 70–99)
Glucose-Capillary: 79 mg/dL (ref 70–99)
Glucose-Capillary: 75 mg/dL (ref 70–99)

## 2014-10-05 MED ORDER — PANTOPRAZOLE SODIUM 40 MG PO TBEC
40.0000 mg | DELAYED_RELEASE_TABLET | Freq: Every day | ORAL | Status: DC
  Administered 2014-10-05 – 2014-10-07 (×3): 40 mg via ORAL
  Filled 2014-10-05 (×4): qty 1

## 2014-10-05 MED ORDER — PANTOPRAZOLE SODIUM 40 MG PO TBEC: 40.0000 mg | DELAYED_RELEASE_TABLET | Freq: Every day | ORAL | Status: DC

## 2014-10-05 NOTE — Progress Notes (Signed)
Utilization review completed.  

## 2014-10-05 NOTE — Progress Notes (Signed)
[facility] Progress Note   Caroline Harvey TIR:[id number] DOB: Oct 29, 1960 DOA: 10/03/2014 PCP: No primary care provider on file.  Brief narrative: Caroline Harvey is a 54 y.o. female with past medical history of hypertension currently not taking any medication, alcohol abuse including daily beer drinking who presents to the hospital with dizziness and weakness. She has had poor by mouth intake and has essentially been taking a limited amount of liquids for the past week. She states that when she tries solid food she develops vomiting. She has not noted any diarrhea or abdominal pain. She has been drinking 6-8 cans of 12 ounce beers for about 2 months now. No history of heavy drinking prior to this. Has never been told she has cirrhosis of the liver. She has had swelling of her ankles and states that she has been taking her friend's fluid pill (currently cannot recall the name) to help reduce the swelling. In the past she was taking lisinopril for hypertension but has not seen a physician in quite a while and therefore has had no prescriptions.   Subjective: No complaints other than wanting to advance diet to go home. No dizziness shortness of breath cough nausea vomiting abdominal pain or diarrhea.  Assessment/Plan: Principal Problem:   Hyponatremia -Related to poor by mouth intake, use of "fluid pill" as outpatient which may have been HCTZ or Lasix and related to beer drinking -Sodium is improving with IV normal saline-symptoms resolved -Urine sodium and osmolality consistent with dehydration -Continue to check serial sodium levels  Active Problems:  Nausea and vomiting -No symptoms of diarrhea-nausea may have been secondary to hyponatremia-we'll advance diet to solids today -if vomiting recurs, will do further workup including evaluation of her gallbladder-LFTs reveal mildly elevated AST -no epigastric tenderness or complaints of pain to suggest gastritis or peptic ulcer disease-she  has been placed on IV Protonix on admission  Hypoglycemia -Continue to follow sugars-increase by mouth intake-asymptomatic when sugar is low    Alcohol abuse -Daily beer drinking-continue CIWA scale-no signs of withdrawal-has been advised to quit   Hypotension with a history of hypertension -Suspect she is volume depleted leading to hypotension at this time-continue to follow blood pressure    Leukopenia/anemia -obtain an anemia panel reveals anemia of chronic disease    Acute kidney injury -Creatinine was 1.26 on admission and has now normalized   Code Status: Full code Family Communication:  Disposition Plan: Home when sodium stable-she has an appointment with the PCP for next week DVT prophylaxis: Lovenox Consultants: Procedures:  Antibiotics: Anti-infectives    None      Objective: Filed Weights   10/03/14 1846 10/04/14 0456 10/05/14 0614  Weight: 120.4 kg (265 lb 6.9 oz) 118 kg (260 lb 2.3 oz) 118.7 kg (261 lb 11 oz)    Intake/Output Summary (Last 24 hours) at 10/05/14 1511 Last data filed at 10/04/14 2036  Gross per 24 hour  Intake    615 ml  Output   1600 ml  Net   -985 ml     Vitals Filed Vitals:   10/04/14 0456 10/04/14 1411 10/04/14 2035 10/05/14 0614  BP:  118/67 117/65   Pulse: 89 83 82 78  Temp: 98.8 F (37.1 C) 98.9 F (37.2 C) 98.6 F (37 C) 98.5 F (36.9 C)  TempSrc: Oral Oral Oral Oral  Resp: 18 18 18 18   Height:      Weight: 118 kg (260 lb 2.3 oz)   118.7 kg (261 lb 11 oz)  SpO2: 98%  98% 100% 100%    Exam:  General:  Pt is alert, not in acute distress  HEENT: No icterus, No thrush  Cardiovascular: regular rate and rhythm, S1/S2 No murmur  Respiratory: clear to auscultation bilaterally   Abdomen: Soft, +Bowel sounds, non tender, non distended, no guarding  MSK: No LE edema, cyanosis or clubbing  Data Reviewed: Basic Metabolic Panel:  Recent Labs Lab 10/04/14 0830 10/04/14 1224 10/04/14 1907 10/05/14 0700  10/05/14 1304  NA 123* 123* 123* 126* 126*  K 4.3 4.2 5.2* 4.0 4.3  CL 93* 90* 94* 93* 95*  CO2 22 23 19* 24 22  GLUCOSE 85 93 105* 88 98  BUN 10 10 6  <5* <5*  CREATININE 0.73 0.72 0.60 0.59 0.62  CALCIUM 8.8* 9.0 8.6* 8.6* 8.8*   Liver Function Tests:  Recent Labs Lab 10/03/14 1314 10/04/14 1907  AST 37 59*  ALT 26 28  ALKPHOS 64 61  BILITOT 0.8 1.2  PROT 6.3* 5.8*  ALBUMIN 3.6 3.3*    Recent Labs Lab 10/03/14 1314  LIPASE 39   No results for input(s): AMMONIA in the last 168 hours. CBC:  Recent Labs Lab 10/03/14 1314 10/04/14 0449  WBC 3.0* 2.5*  NEUTROABS 1.4*  --   HGB 12.4 10.6*  HCT 35.2* 29.8*  MCV 91.2 90.9  PLT 223 188   Cardiac Enzymes: No results for input(s): CKTOTAL, CKMB, CKMBINDEX, TROPONINI in the last 168 hours. BNP (last 3 results) No results for input(s): BNP in the last 8760 hours.  ProBNP (last 3 results) No results for input(s): PROBNP in the last 8760 hours.  CBG:  Recent Labs Lab 10/04/14 1251 10/04/14 1645 10/04/14 2240 10/05/14 0611 10/05/14 1125  GLUCAP 84 97 107* 82 54*    No results found for this or any previous visit (from the past 240 hour(s)).   Studies: No results found.  Scheduled Meds:  Scheduled Meds: . enoxaparin (LOVENOX) injection  40 mg Subcutaneous Q24H  . folic acid  1 mg Oral Daily  . multivitamin with minerals  1 tablet Oral Daily  . pantoprazole  40 mg Oral QHS  . sodium chloride  3 mL Intravenous Q12H  . thiamine  100 mg Oral Daily   Or  . thiamine  100 mg Intravenous Daily   Continuous Infusions: . sodium chloride 125 mL/hr at 10/05/14 1012    Time spent on care of this patient: 35 minutes   [place], MD 10/05/2014, 3:11 PM  LOS: 2 days   [facility] Office  [phone] Pager - Text Page per [email or web address]  If 7PM-7AM, please contact night-coverage [email or web address]

## 2014-10-06 LAB — BASIC METABOLIC PANEL
Anion gap: 6 (ref 5–15)
Anion gap: 8 (ref 5–15)
Anion gap: 8 (ref 5–15)
BUN: <5 mg/dL — ABNORMAL LOW (ref 6–20)
BUN: <5 mg/dL — ABNORMAL LOW (ref 6–20)
CALCIUM: 8.4 mg/dL — AB (ref 8.9–10.3)
CO2: 23 mmol/L (ref 22–32)
CO2: 24 mmol/L (ref 22–32)
CO2: 24 mmol/L (ref 22–32)
CREATININE: 0.49 mg/dL (ref 0.44–1.00)
CREATININE: 0.66 mg/dL (ref 0.44–1.00)
Calcium: 8.6 mg/dL — ABNORMAL LOW (ref 8.9–10.3)
Calcium: 8.7 mg/dL — ABNORMAL LOW (ref 8.9–10.3)
Chloride: 99 mmol/L — ABNORMAL LOW (ref 101–111)
Chloride: 96 mmol/L — ABNORMAL LOW (ref 101–111)
Chloride: 95 mmol/L — ABNORMAL LOW (ref 101–111)
Creatinine, Ser: 0.68 mg/dL (ref 0.44–1.00)
GFR calc Af Amer: >60 mL/min (ref >60)
GFR calc Af Amer: >60 mL/min (ref >60)
GLUCOSE: 94 mg/dL (ref 65–99)
GLUCOSE: 88 mg/dL (ref 65–99)
Glucose, Bld: 93 mg/dL (ref 65–99)
POTASSIUM: 3.9 mmol/L (ref 3.5–5.1)
Potassium: 4.1 mmol/L (ref 3.5–5.1)
Potassium: 4.4 mmol/L (ref 3.5–5.1)
SODIUM: 127 mmol/L — AB (ref 135–145)
Sodium: 128 mmol/L — ABNORMAL LOW (ref 135–145)
Sodium: 128 mmol/L — ABNORMAL LOW (ref 135–145)

## 2014-10-06 LAB — HEPATIC FUNCTION PANEL
ALT: 29 U/L (ref 14–54)
AST: 46 U/L — ABNORMAL HIGH (ref 15–41)
Albumin: 3.4 g/dL — ABNORMAL LOW (ref 3.5–5.0)
Alkaline Phosphatase: 61 U/L (ref 38–126)
BILIRUBIN DIRECT: 0.2 mg/dL (ref 0.1–0.5)
BILIRUBIN INDIRECT: 0.4 mg/dL (ref 0.3–0.9)
Total Bilirubin: 0.6 mg/dL (ref 0.3–1.2)
Total Protein: 6.1 g/dL — ABNORMAL LOW (ref 6.5–8.1)

## 2014-10-06 LAB — GLUCOSE, CAPILLARY
GLUCOSE-CAPILLARY: 84 mg/dL (ref 65–99)
Glucose-Capillary: 90 mg/dL (ref 65–99)
Glucose-Capillary: 76 mg/dL (ref 65–99)
Glucose-Capillary: 81 mg/dL (ref 65–99)

## 2014-10-06 NOTE — Progress Notes (Signed)
[facility] Progress Note   Caroline Harvey ZOX:[id number] DOB: 1960/09/24 DOA: 10/03/2014 PCP: No primary care provider on file.  Brief narrative: Caroline Harvey is a 54 y.o. female with past medical history of hypertension currently not taking any medication, alcohol abuse including daily beer drinking who presents to the hospital with dizziness and weakness. She has had poor by mouth intake and has essentially been taking a limited amount of liquids for the past week. She states that when she tries solid food she develops vomiting. She has not noted any diarrhea or abdominal pain. She has been drinking 6-8 cans of 12 ounce beers for about 2 months now. No history of heavy drinking prior to this. Has never been told she has cirrhosis of the liver. She has had swelling of her ankles and states that she has been taking her friend's fluid pill (currently cannot recall the name) to help reduce the swelling. In the past she was taking lisinopril for hypertension but has not seen a physician in quite a while and therefore has had no prescriptions.   Subjective: Nausea has improved,, no signs of alcohol withdrawal   Assessment/Plan: Principal Problem:   Hyponatremia -Related to poor by mouth intake, use of "fluid pill" as outpatient which may have been HCTZ or Lasix and related to beer drinking, improving very very slowly -Sodium is improving with IV normal saline-symptoms resolved -Urine sodium and osmolality consistent with dehydration Place patient on oral fluid   Restriction at 1200 mL per day     Nausea and vomiting -No symptoms of diarrhea- Continue regular diet with fluid restriction -if vomiting recurs, will do further workup including evaluation of her gallbladder-LFTs reveal mildly elevated AST -no epigastric tenderness or complaints of pain to suggest gastritis or peptic ulcer disease- Continue PPI  Hypoglycemia -Continue to follow sugars-increase by mouth  intake-asymptomatic when sugar is low    Alcohol abuse -Daily beer drinking-continue CIWA scale-no signs of withdrawal-  Stable liver function  Hypotension with a history of hypertension -Suspect she is volume depleted leading to hypotension at this time-continue to follow blood pressure Hold HCTZ, lisinopril    Leukopenia/anemia   anemia panel reveals anemia of chronic disease Repeat CBC in the morning    Acute kidney injury -Creatinine was 1.26 on admission and has now normalized   Code Status: Full code Family Communication:  Disposition Plan: Home when sodium stable-possibly tomorrow DVT prophylaxis: Lovenox Consultants: Procedures:  Antibiotics: Anti-infectives    None      Objective: Filed Weights   10/04/14 0456 10/05/14 0614 10/06/14 0347  Weight: 118 kg (260 lb 2.3 oz) 118.7 kg (261 lb 11 oz) 121.7 kg (268 lb 4.8 oz)    Intake/Output Summary (Last 24 hours) at 10/06/14 1207 Last data filed at 10/06/14 0900  Gross per 24 hour  Intake    240 ml  Output      0 ml  Net    240 ml     Vitals Filed Vitals:   10/05/14 0614 10/05/14 1518 10/05/14 2009 10/06/14 0347  BP:  151/79 114/76 146/78  Pulse: 78 82 78 79  Temp: 98.5 F (36.9 C) 97.7 F (36.5 C) 97.6 F (36.4 C) 97.9 F (36.6 C)  TempSrc: Oral Oral Oral Oral  Resp: 18 18 18 18   Height:      Weight: 118.7 kg (261 lb 11 oz)   121.7 kg (268 lb 4.8 oz)  SpO2: 100% 90% 97% 100%    Exam:  General:  Pt  is alert, not in acute distress  HEENT: No icterus, No thrush  Cardiovascular: regular rate and rhythm, S1/S2 No murmur  Respiratory: clear to auscultation bilaterally   Abdomen: Soft, +Bowel sounds, non tender, non distended, no guarding  MSK: No LE edema, cyanosis or clubbing  Data Reviewed: Basic Metabolic Panel:  Recent Labs Lab 10/04/14 1907 10/05/14 0700 10/05/14 1304 10/05/14 1955 10/06/14 0535  NA 123* 126* 126* 127* 128*  K 5.2* 4.0 4.3 4.9 4.1  CL 94* 93* 95* 94* 99*  CO2  19* 24 22 21* 23  GLUCOSE 105* 88 98 73 93  BUN 6 <5* <5* <5* <5*  CREATININE 0.60 0.59 0.62 0.70 0.49  CALCIUM 8.6* 8.6* 8.8* 8.7* 8.4*   Liver Function Tests:  Recent Labs Lab 10/03/14 1314 10/04/14 1907 10/06/14 0750  AST 37 59* 46*  ALT 26 28 29   ALKPHOS 64 61 61  BILITOT 0.8 1.2 0.6  PROT 6.3* 5.8* 6.1*  ALBUMIN 3.6 3.3* 3.4*    Recent Labs Lab 10/03/14 1314  LIPASE 39   No results for input(s): AMMONIA in the last 168 hours. CBC:  Recent Labs Lab 10/03/14 1314 10/04/14 0449  WBC 3.0* 2.5*  NEUTROABS 1.4*  --   HGB 12.4 10.6*  HCT 35.2* 29.8*  MCV 91.2 90.9  PLT 223 188   Cardiac Enzymes: No results for input(s): CKTOTAL, CKMB, CKMBINDEX, TROPONINI in the last 168 hours. BNP (last 3 results) No results for input(s): BNP in the last 8760 hours.  ProBNP (last 3 results) No results for input(s): PROBNP in the last 8760 hours.  CBG:  Recent Labs Lab 10/05/14 1146 10/05/14 1657 10/05/14 2205 10/06/14 0615 10/06/14 1149  GLUCAP 79 75 72 90 84    No results found for this or any previous visit (from the past 240 hour(s)).   Studies: No results found.  Scheduled Meds:  Scheduled Meds: . enoxaparin (LOVENOX) injection  40 mg Subcutaneous Q24H  . folic acid  1 mg Oral Daily  . multivitamin with minerals  1 tablet Oral Daily  . pantoprazole  40 mg Oral Q breakfast  . sodium chloride  3 mL Intravenous Q12H  . thiamine  100 mg Oral Daily   Or  . thiamine  100 mg Intravenous Daily   Continuous Infusions: . sodium chloride 125 mL/hr at 10/06/14 0910    Time spent on care of this patient: 35 minutes   [name], MD 10/06/2014, 12:07 PM  LOS: 3 days   [facility] Office  [phone] Pager - Text Page per [email or web address]  If 7PM-7AM, please contact night-coverage [email or web address]

## 2014-10-06 NOTE — Care Management Note (Signed)
Case Management Note  Patient Details  Name: Caroline Harvey MRN: [id number] Date of Birth: Aug 16, 1960  Subjective/Objective:      Pt admitted with Hyponatremia.              Action/Plan:  CM will monitor for disposition needs   Expected Discharge Date:                  Expected Discharge Plan:  Home/Self Care  In-House Referral:     Discharge planning Services  CM Consult  Post Acute Care Choice:    Choice offered to:     DME Arranged:    DME Agency:     HH Arranged:    [place] Agency:     Status of Service:  Completed, signed off  Medicare Important Message Given:  No Date Medicare IM Given:    Medicare IM give by:    Date Additional Medicare IM Given:    Additional Medicare Important Message give by:     If discussed at [place] of Stay Meetings, dates discussed:    Disposition Plan:  Home , self care  Additional Comments: 10/06/14  Elenor Quinones, RN, BSN [phone].  Epic did not show insurance for pt.  CM was informed by pt that she has health insurance and already has an initial appointment with a chosen provider.  CM faxed [facility] card to admitting.  No additional CM needs at this time.   Maryclare Labrador, RN 10/06/2014, 3:44 PM

## 2014-10-07 LAB — CBC
HCT: 31.1 % — ABNORMAL LOW (ref 36.0–46.0)
Hemoglobin: 10.3 g/dL — ABNORMAL LOW (ref 12.0–15.0)
MCH: 31.2 pg (ref 26.0–34.0)
MCHC: 33.1 g/dL (ref 30.0–36.0)
MCV: 94.2 fL (ref 78.0–100.0)
Platelets: 191 10*3/uL (ref 150–400)
RBC: 3.3 MIL/uL — ABNORMAL LOW (ref 3.87–5.11)
RDW: 14 % (ref 11.5–15.5)
WBC: 2.5 10*3/uL — AB (ref 4.0–10.5)

## 2014-10-07 LAB — GLUCOSE, CAPILLARY
GLUCOSE-CAPILLARY: 68 mg/dL (ref 65–99)
GLUCOSE-CAPILLARY: 91 mg/dL (ref 65–99)
Glucose-Capillary: 71 mg/dL (ref 65–99)

## 2014-10-07 MED ORDER — PANTOPRAZOLE SODIUM 40 MG PO TBEC: 40.0000 mg | DELAYED_RELEASE_TABLET | Freq: Every day | ORAL | Status: DC

## 2014-10-07 MED ORDER — THIAMINE HCL 100 MG PO TABS: 100.0000 mg | ORAL_TABLET | Freq: Every day | ORAL | Status: DC

## 2014-10-07 NOTE — Progress Notes (Signed)
Discharged to home with family office visits in place teaching done  

## 2014-10-07 NOTE — Progress Notes (Signed)
Pt had CBG 68, gave OJ and graham crackers, rechecked 71 gave milk and peaches will continue to monitor, thanks Arvella Nigh RN

## 2014-10-07 NOTE — Discharge Summary (Signed)
Physician Discharge Summary  Caroline Harvey MRN: [id number] DOB/AGE: 28-Dec-1960 54 y.o.  PCP: No primary care provider on file.   Admit date: 10/03/2014 Discharge date: 10/07/2014  Discharge Diagnoses:     Principal Problem:   Hyponatremia Active Problems:   Alcohol abuse   HYPERTENSION, BENIGN   GERD   Nausea vomiting and diarrhea   Leukopenia   Acute kidney injury  Follow-up recommendations Follow-up with PCP in 3 days Follow-up CBC, CMP to monitor renal function and sodium levels    Medication List    TAKE these medications        pantoprazole 40 MG tablet  Commonly known as:  PROTONIX  Take 1 tablet (40 mg total) by mouth daily with breakfast.     thiamine 100 MG tablet  Take 1 tablet (100 mg total) by mouth daily.        Discharge Condition: Stable Disposition: 01-Home or Self Care   Consults: None  Significant Diagnostic Studies: No results found.    Microbiology: No results found for this or any previous visit (from the past 240 hour(s)).   Labs: Results for orders placed or performed during the hospital encounter of 10/03/14 (from the past 48 hour(s))  Glucose, capillary     Status: Abnormal   Collection Time: 10/05/14 11:25 AM  Result Value Ref Range   Glucose-Capillary 54 (L) 70 - 99 mg/dL  Glucose, capillary     Status: None   Collection Time: 10/05/14 11:46 AM  Result Value Ref Range   Glucose-Capillary 79 70 - 99 mg/dL  Basic metabolic panel     Status: Abnormal   Collection Time: 10/05/14  1:04 PM  Result Value Ref Range   Sodium 126 (L) 135 - 145 mmol/L   Potassium 4.3 3.5 - 5.1 mmol/L   Chloride 95 (L) 101 - 111 mmol/L   CO2 22 22 - 32 mmol/L   Glucose, Bld 98 70 - 99 mg/dL   BUN <5 (L) 6 - 20 mg/dL   Creatinine, Ser 0.62 0.44 - 1.00 mg/dL   Calcium 8.8 (L) 8.9 - 10.3 mg/dL   GFR calc non Af Amer >60 >60 mL/min   GFR calc Af Amer >60 >60 mL/min    Comment: (NOTE) The eGFR has been calculated using the CKD EPI  equation. This calculation has not been validated in all clinical situations. eGFR's persistently <60 mL/min signify possible Chronic Kidney Disease.    Anion gap 9 5 - 15  Glucose, capillary     Status: None   Collection Time: 10/05/14  4:57 PM  Result Value Ref Range   Glucose-Capillary 75 70 - 99 mg/dL  Basic metabolic panel     Status: Abnormal   Collection Time: 10/05/14  7:55 PM  Result Value Ref Range   Sodium 127 (L) 135 - 145 mmol/L   Potassium 4.9 3.5 - 5.1 mmol/L   Chloride 94 (L) 101 - 111 mmol/L   CO2 21 (L) 22 - 32 mmol/L   Glucose, Bld 73 70 - 99 mg/dL   BUN <5 (L) 6 - 20 mg/dL   Creatinine, Ser 0.70 0.44 - 1.00 mg/dL   Calcium 8.7 (L) 8.9 - 10.3 mg/dL   GFR calc non Af Amer >60 >60 mL/min   GFR calc Af Amer >60 >60 mL/min    Comment: (NOTE) The eGFR has been calculated using the CKD EPI equation. This calculation has not been validated in all clinical situations. eGFR's persistently <60 mL/min signify possible Chronic  Kidney Disease.    Anion gap 12 5 - 15  Glucose, capillary     Status: None   Collection Time: 10/05/14 10:05 PM  Result Value Ref Range   Glucose-Capillary 72 70 - 99 mg/dL  Basic metabolic panel     Status: Abnormal   Collection Time: 10/06/14  5:35 AM  Result Value Ref Range   Sodium 128 (L) 135 - 145 mmol/L   Potassium 4.1 3.5 - 5.1 mmol/L   Chloride 99 (L) 101 - 111 mmol/L   CO2 23 22 - 32 mmol/L   Glucose, Bld 93 65 - 99 mg/dL   BUN <5 (L) 6 - 20 mg/dL   Creatinine, Ser 0.49 0.44 - 1.00 mg/dL   Calcium 8.4 (L) 8.9 - 10.3 mg/dL   GFR calc non Af Amer >60 >60 mL/min   GFR calc Af Amer >60 >60 mL/min    Comment: (NOTE) The eGFR has been calculated using the CKD EPI equation. This calculation has not been validated in all clinical situations. eGFR's persistently <60 mL/min signify possible Chronic Kidney Disease.    Anion gap 6 5 - 15  Glucose, capillary     Status: None   Collection Time: 10/06/14  6:15 AM  Result Value Ref  Range   Glucose-Capillary 90 65 - 99 mg/dL  Hepatic function panel     Status: Abnormal   Collection Time: 10/06/14  7:50 AM  Result Value Ref Range   Total Protein 6.1 (L) 6.5 - 8.1 g/dL   Albumin 3.4 (L) 3.5 - 5.0 g/dL   AST 46 (H) 15 - 41 U/L   ALT 29 14 - 54 U/L   Alkaline Phosphatase 61 38 - 126 U/L   Total Bilirubin 0.6 0.3 - 1.2 mg/dL   Bilirubin, Direct 0.2 0.1 - 0.5 mg/dL   Indirect Bilirubin 0.4 0.3 - 0.9 mg/dL  Glucose, capillary     Status: None   Collection Time: 10/06/14 11:49 AM  Result Value Ref Range   Glucose-Capillary 84 65 - 99 mg/dL  Basic metabolic panel     Status: Abnormal   Collection Time: 10/06/14  1:25 PM  Result Value Ref Range   Sodium 128 (L) 135 - 145 mmol/L   Potassium 4.4 3.5 - 5.1 mmol/L   Chloride 96 (L) 101 - 111 mmol/L   CO2 24 22 - 32 mmol/L   Glucose, Bld 94 65 - 99 mg/dL   BUN <5 (L) 6 - 20 mg/dL   Creatinine, Ser 0.66 0.44 - 1.00 mg/dL   Calcium 8.6 (L) 8.9 - 10.3 mg/dL   GFR calc non Af Amer >60 >60 mL/min   GFR calc Af Amer >60 >60 mL/min    Comment: (NOTE) The eGFR has been calculated using the CKD EPI equation. This calculation has not been validated in all clinical situations. eGFR's persistently <60 mL/min signify possible Chronic Kidney Disease.    Anion gap 8 5 - 15  Glucose, capillary     Status: None   Collection Time: 10/06/14  4:44 PM  Result Value Ref Range   Glucose-Capillary 76 65 - 99 mg/dL  Basic metabolic panel     Status: Abnormal   Collection Time: 10/06/14  8:39 PM  Result Value Ref Range   Sodium 127 (L) 135 - 145 mmol/L   Potassium 3.9 3.5 - 5.1 mmol/L   Chloride 95 (L) 101 - 111 mmol/L   CO2 24 22 - 32 mmol/L   Glucose, Bld 88 65 - 99   mg/dL   BUN <5 (L) 6 - 20 mg/dL   Creatinine, Ser 0.68 0.44 - 1.00 mg/dL   Calcium 8.7 (L) 8.9 - 10.3 mg/dL   GFR calc non Af Amer >60 >60 mL/min   GFR calc Af Amer >60 >60 mL/min    Comment: (NOTE) The eGFR has been calculated using the CKD EPI equation. This  calculation has not been validated in all clinical situations. eGFR's persistently <60 mL/min signify possible Chronic Kidney Disease.    Anion gap 8 5 - 15  Glucose, capillary     Status: None   Collection Time: 10/06/14  9:12 PM  Result Value Ref Range   Glucose-Capillary 81 65 - 99 mg/dL  Glucose, capillary     Status: None   Collection Time: 10/07/14  6:04 AM  Result Value Ref Range   Glucose-Capillary 68 65 - 99 mg/dL  Glucose, capillary     Status: None   Collection Time: 10/07/14  6:41 AM  Result Value Ref Range   Glucose-Capillary 71 65 - 99 mg/dL   Comment 1 Notify RN    Comment 2 Document in Chart      HPI :*53 y.o. female , single, lives alone, independent of activities of daily living, history of hypertension since 2009-noncompliant with medications, recently started taking a friend's HCTZ and lisinopril, chronic alcohol abuse, presented to [facility] ED on 10/03/14 with nausea, vomiting, diarrhea, abdominal pain, dizziness and weakness. Patient states that she has a new PCP appointment on 10/10/2014. She was in her usual state of health until approximately 2 weeks ago when she started experiencing nausea, several episodes of nonbloody emesis, poor appetite, diarrhea with each meal-watery without blood or mucus but no fevers or chills. The symptoms progressively worsened. She denies cyclic contacts, eating anything unusual or recent antibiotic use. She states that her abdominal soreness started after a few days from intense vomiting and diarrhea. She describes this as diffuse, dull aching, 6-7/10 in severity, no aggravating or relieving factors. Denies NSAID use. Has been using her friend's Percocet. She used some Pepto-Bismol a couple of days ago and since then her nausea, vomiting and diarrhea have significantly improved. She is able to tolerate liquid diet. She however continues to be weak and feels dizzy on standing. She denies passing out. She sustained an episode of fall 2 days ago  when she missed a step but did not sustain significant injuries. She denies confusion or strokelike symptoms. Due to continued weakness, she presented to the ED where sodium 116 (confirmed by repeating), chloride 85, creatinine 132, WBC 3. Hospitalist admission was requested.   HOSPITAL COURSE:   Hyponatremia -Related to poor by mouth intake, use of "fluid pill" as outpatient which may have been HCTZ or Lasix and related to beer drinking, improving very very slowly -Sodium is improving with IV normal saline-symptoms resolved, sodium between 127 - 128 over the last couple of days. Patient has been placed on fluid restriction Patient endorses that she will quit drinking -Urine sodium and osmolality consistent with dehydration Sodium did improve with normal saline but has plateaued, since the patient is asymptomatic she will go home today with follow-up on Monday to check her sodium    Nausea and vomiting Probably related to low sodium now resolved Tolerating regular diet Continue PPI  Hypoglycemia -Patient was noted to have low CBGs during this admission No History of diabetes Recommend checking hemoglobin A1c outpatient   Alcohol abuse -Daily beer drinking-continue CIWA scale-no signs of withdrawal-  Renal   function uncompromised Patient counseled by social worker  Hypotension with a history of hypertension -Suspect she is volume depleted leading to hypotension  Patient currently on no antihypertensives at the time of discharge   Leukopenia/anemia  anemia panel reveals anemia of chronic disease Hemoglobin stable around 10.6, no signs of active bleeding Repeat CBC outpatient   Acute kidney injury -Creatinine was 1.26 on admission and has now normalized, 0.68 prior to discharge   Code Status: Full code   Discharge Exam:    Blood pressure 138/94, pulse 74, temperature 98.1 F (36.7 C), temperature source Oral, resp. rate 18, height 5' 9" (1.753 m), weight 121.7 kg (268  lb 4.8 oz), last menstrual period 12/14/2010, SpO2 100 %.   General: Pt is alert, not in acute distress  HEENT: No icterus, No thrush  Cardiovascular: regular rate and rhythm, S1/S2 No murmur  Respiratory: clear to auscultation bilaterally   Abdomen: Soft, +Bowel sounds, non tender, non distended, no guarding  MSK: No LE edema, cyanosis or clubbing       Discharge Instructions    Diet - low sodium heart healthy    Complete by:  As directed   Fluid restriction to 1200 mL a day     Increase activity slowly    Complete by:  As directed            Follow-up Information    Follow up with PCP. Schedule an appointment as soon as possible for a visit in 3 days.      Signed: ABROL,NAYANA 10/07/2014, 10:20 AM          

## 2015-07-05 ENCOUNTER — Encounter (HOSPITAL_COMMUNITY): Payer: Self-pay | Admitting: *Deleted

## 2015-07-05 ENCOUNTER — Emergency Department (INDEPENDENT_AMBULATORY_CARE_PROVIDER_SITE_OTHER): Payer: Self-pay

## 2015-07-05 ENCOUNTER — Emergency Department (INDEPENDENT_AMBULATORY_CARE_PROVIDER_SITE_OTHER)
Admission: EM | Admit: 2015-07-05 | Discharge: 2015-07-05 | Disposition: A | Discharge status: Home / Self Care | Payer: Self-pay | Source: Home / Self Care | Attending: Family Medicine | Admitting: Family Medicine

## 2015-07-05 DIAGNOSIS — J069 Acute upper respiratory infection, unspecified: Secondary | ICD-10-CM

## 2015-07-05 MED ORDER — METHYLPREDNISOLONE SODIUM SUCC 125 MG IJ SOLR
INTRAMUSCULAR | Status: AC
Start: 2015-07-05 — End: 2015-07-05
  Filled 2015-07-05: qty 2

## 2015-07-05 MED ORDER — IPRATROPIUM BROMIDE 0.06 % NA SOLN: 2.0000 | Freq: Four times a day (QID) | NASAL | Status: DC

## 2015-07-05 MED ORDER — METHYLPREDNISOLONE SODIUM SUCC 125 MG IJ SOLR
125.0000 mg | Freq: Once | INTRAMUSCULAR | Status: AC
  Administered 2015-07-05: 125 mg via INTRAMUSCULAR

## 2015-07-05 MED ORDER — PREDNISONE 50 MG PO TABS: ORAL_TABLET | ORAL | Status: DC

## 2015-07-05 MED ORDER — GUAIFENESIN-CODEINE 100-10 MG/5ML PO SYRP: 10.0000 mL | ORAL_SOLUTION | Freq: Four times a day (QID) | ORAL | Status: DC | PRN

## 2015-07-05 MED ORDER — AZITHROMYCIN 250 MG PO TABS: ORAL_TABLET | ORAL | Status: DC

## 2015-07-05 NOTE — ED Notes (Addendum)
PT  REPORTS   SHORTNESS  OF  BREATH   COUGH  AND  CONGESTED        SYMPTOMS  X  3  WEEKS            STUFFY  NOSE  COUGH           NON  SMOKER       PT  REPORTS  BODY  ACHES  AS   WELL

## 2015-07-05 NOTE — ED Provider Notes (Signed)
CSN: [id number]     Arrival date & time 07/05/15  1837 History   First MD Initiated Contact with Patient 07/05/15 2013     Chief Complaint  Patient presents with  . Shortness of Breath   (Consider location/radiation/quality/duration/timing/severity/associated sxs/prior Treatment) Patient is a 55 y.o. female presenting with cough. The history is provided by the patient.  Cough Cough characteristics:  Non-productive, dry and harsh Severity:  Moderate Onset quality:  Gradual Duration:  3 weeks Chronicity:  New Smoker: no   Context: upper respiratory infection   Associated symptoms: chest pain, fever, myalgias, rhinorrhea, sinus congestion and wheezing     Past Medical History  Diagnosis Date  . Hypertension   . Alcohol abuse   . Anemia   . Hyponatremia 09/2014   Past Surgical History  Procedure Laterality Date  . Ankle surgery Right     x  2   Family History  Problem Relation Age of Onset  . Hypertension Mother    Social History  Substance Use Topics  . Smoking status: Never Smoker   . Smokeless tobacco: Never Used  . Alcohol Use: 25.2 oz/week    42 Cans of beer per week   OB History    No data available     Review of Systems  Constitutional: Positive for fever.  HENT: Positive for rhinorrhea.   Respiratory: Positive for cough and wheezing.   Cardiovascular: Positive for chest pain.  Genitourinary: Negative.   Musculoskeletal: Positive for myalgias.  All other systems reviewed and are negative.   Allergies  Review of patient's allergies indicates no known allergies.  Home Medications   Prior to Admission medications   Medication Sig Start Date End Date Taking? Authorizing Provider  azithromycin (ZITHROMAX Z-PAK) 250 MG tablet Take as directed on pack 07/05/15   Billy Fischer, MD  guaiFENesin-codeine [facility]) 100-10 MG/5ML syrup Take 10 mLs by mouth 4 (four) times daily as needed for cough. 07/05/15   Billy Fischer, MD  ipratropium (ATROVENT) 0.06 %  nasal spray Place 2 sprays into both nostrils 4 (four) times daily. 07/05/15   Billy Fischer, MD  pantoprazole (PROTONIX) 40 MG tablet Take 1 tablet (40 mg total) by mouth daily with breakfast. 10/07/14   Reyne Dumas, MD  predniSONE (DELTASONE) 50 MG tablet 1 tab daily for 2 days then 1/2 tab daily for 2 days. Start on thurs 2/9. 07/05/15   Billy Fischer, MD  thiamine 100 MG tablet Take 1 tablet (100 mg total) by mouth daily. 10/07/14   Reyne Dumas, MD   Meds Ordered and Administered this Visit   Medications  methylPREDNISolone sodium succinate (SOLU-MEDROL) 125 mg/2 mL injection 125 mg (not administered)    BP 155/81 mmHg  Pulse 92  Temp(Src) 99.7 F (37.6 C) (Oral)  Resp 20  SpO2 95%  LMP 12/14/2010 No data found.   Physical Exam  Constitutional: She is oriented to person, place, and time. She appears well-developed and well-nourished.  HENT:  Right Ear: External ear normal.  Left Ear: External ear normal.  Mouth/Throat: Oropharynx is clear and moist.  Neck: Normal range of motion. Neck supple.  Cardiovascular: Normal rate, regular rhythm, normal heart sounds and intact distal pulses.   Pulmonary/Chest: She has decreased breath sounds. She has wheezes. She has rhonchi. She has no rales.  Abdominal: Soft. Bowel sounds are normal.  Lymphadenopathy:    She has no cervical adenopathy.  Neurological: She is alert and oriented to person, place, and time.  Skin: Skin is warm and dry.  Nursing note and vitals reviewed.   ED Course  Procedures (including critical care time)  Labs Review Labs Reviewed - No data to display  Imaging Review Dg Chest 2 View  07/05/2015  CLINICAL DATA:  Per pt: sick for three weeks with cough, chest congestion. Non-smoker. HBP controlled with medication. No history of respiratory disease. Patient is not a diabetic EXAM: CHEST  2 VIEW COMPARISON:  01/20/2013. FINDINGS: The heart size and mediastinal contours are within normal limits. Both lungs are clear.  The visualized skeletal structures are unremarkable. IMPRESSION: No active cardiopulmonary disease.  Stable from priors. Electronically Signed   By: Staci Righter M.D.   On: 07/05/2015 20:42     Visual Acuity Review  Right Eye Distance:   Left Eye Distance:   Bilateral Distance:    Right Eye Near:   Left Eye Near:    Bilateral Near:         MDM   1. URI (upper respiratory infection)    Meds ordered this encounter  Medications  . methylPREDNISolone sodium succinate (SOLU-MEDROL) 125 mg/2 mL injection 125 mg    Sig:   . azithromycin (ZITHROMAX Z-PAK) 250 MG tablet    Sig: Take as directed on pack    Dispense:  6 tablet    Refill:  0  . ipratropium (ATROVENT) 0.06 % nasal spray    Sig: Place 2 sprays into both nostrils 4 (four) times daily.    Dispense:  15 mL    Refill:  1  . predniSONE (DELTASONE) 50 MG tablet    Sig: 1 tab daily for 2 days then 1/2 tab daily for 2 days. Start on thurs 2/9.    Dispense:  3 tablet    Refill:  0  . guaiFENesin-codeine (ROBITUSSIN AC) 100-10 MG/5ML syrup    Sig: Take 10 mLs by mouth 4 (four) times daily as needed for cough.    Dispense:  180 mL    Refill:  0        Billy Fischer, MD 07/05/15 2056

## 2015-12-04 ENCOUNTER — Other Ambulatory Visit: Payer: Self-pay | Admitting: Obstetrics and Gynecology

## 2015-12-04 DIAGNOSIS — Z1231 Encounter for screening mammogram for malignant neoplasm of breast: Secondary | ICD-10-CM

## 2015-12-28 ENCOUNTER — Encounter (HOSPITAL_COMMUNITY): Payer: Self-pay

## 2015-12-28 ENCOUNTER — Ambulatory Visit
Admission: RE | Admit: 2015-12-28 | Discharge: 2015-12-28 | Disposition: A | Discharge status: Home / Self Care | Payer: No Typology Code available for payment source | Source: Ambulatory Visit | Attending: Obstetrics and Gynecology | Admitting: Obstetrics and Gynecology

## 2015-12-28 ENCOUNTER — Ambulatory Visit (HOSPITAL_COMMUNITY)
Admission: RE | Admit: 2015-12-28 | Discharge: 2015-12-28 | Disposition: A | Discharge status: Home / Self Care | Payer: Self-pay | Source: Ambulatory Visit | Attending: Obstetrics and Gynecology | Admitting: Obstetrics and Gynecology

## 2015-12-28 VITALS — BP 142/100 | Temp 99.0°F | Ht 69.0 in | Wt 289.0 lb

## 2015-12-28 DIAGNOSIS — Z1239 Encounter for other screening for malignant neoplasm of breast: Secondary | ICD-10-CM

## 2015-12-28 DIAGNOSIS — Z1231 Encounter for screening mammogram for malignant neoplasm of breast: Secondary | ICD-10-CM

## 2015-12-28 DIAGNOSIS — N898 Other specified noninflammatory disorders of vagina: Secondary | ICD-10-CM

## 2015-12-28 NOTE — Patient Instructions (Addendum)
Explained breast self awareness to [facility]. Let patient know [facility] will cover Pap smears and HPV typing every 5 years unless has a history of abnormal Pap smears. Referred patient to the [facility] for a screening mammogram. Appointment scheduled for Thursday, December 28, 2015 at 1110. Let patient know will follow up with her within the next couple weeks with results of Pap smear and wet prep by phone. Caroline Harvey verbalized understanding.  Zi Sek, Arvil Chaco, RN 1:56 PM

## 2015-12-28 NOTE — Progress Notes (Addendum)
No complaints today.   Pap Smear: Pap smear completed today. Last Pap smear was 12/01/2009 and normal. Per patient has no history of an abnormal Pap smear. Last Pap smear result is in EPIC.  Physical exam: Breasts Breasts symmetrical. No skin abnormalities bilateral breasts. No nipple retraction bilateral breasts. No nipple discharge bilateral breasts. No lymphadenopathy. No lumps palpated bilateral breasts. No complaints of pain or tenderness on exam. Referred patient to the [facility] for a screening mammogram. Appointment scheduled for Thursday, December 28, 2015 at 1110.  Pelvic/Bimanual   Ext Genitalia No lesions, no swelling and no discharge observed on external genitalia.         Vagina Vagina pink and normal texture. No lesions and thick whitish/yellowish colored discharge observed in vagina. Wet prep completed.          Cervix Cervix is present. Cervix pink and of normal texture. Thick whitish/yellowish colored discharge observed on cervical os.     Uterus Uterus is present and palpable. Uterus in normal position and normal size.        Adnexae Bilateral ovaries present and palpable. No tenderness on palpation.          Rectovaginal No rectal exam completed today since patient had no rectal complaints. No skin abnormalities observed on exam.    Smoking History: Patient has never smoked.  Patient Navigation: Patient education provided. Access to services provided for patient through [facility] program.   Colorectal Cancer Screening: Patient has never had a colonoscopy. No complaints today.

## 2015-12-28 NOTE — Addendum Note (Signed)
Encounter addended by: Loletta Parish, RN on: 12/28/2015  2:08 PM<BR>    Actions taken: Sign clinical note

## 2015-12-28 NOTE — Addendum Note (Signed)
Encounter addended by: Loletta Parish, RN on: 12/28/2015  2:07 PM<BR>    Actions taken: Sign clinical note

## 2015-12-29 ENCOUNTER — Telehealth: Payer: Self-pay

## 2015-12-29 LAB — CYTOLOGY - PAP

## 2015-12-29 NOTE — Telephone Encounter (Signed)
Called to inform about [place]. Mail box was full. Will try again later.

## 2016-01-01 ENCOUNTER — Encounter (HOSPITAL_COMMUNITY): Payer: Self-pay | Admitting: *Deleted

## 2016-01-01 ENCOUNTER — Telehealth (HOSPITAL_COMMUNITY): Payer: Self-pay | Admitting: *Deleted

## 2016-01-01 ENCOUNTER — Other Ambulatory Visit: Payer: Self-pay | Admitting: Obstetrics and Gynecology

## 2016-01-01 DIAGNOSIS — R928 Other abnormal and inconclusive findings on diagnostic imaging of breast: Secondary | ICD-10-CM

## 2016-01-01 NOTE — Telephone Encounter (Signed)
Telephoned patient at home # and left message to return call to [facility] 

## 2016-01-03 ENCOUNTER — Other Ambulatory Visit (HOSPITAL_COMMUNITY): Payer: Self-pay | Admitting: *Deleted

## 2016-01-03 ENCOUNTER — Telehealth (HOSPITAL_COMMUNITY): Payer: Self-pay | Admitting: *Deleted

## 2016-01-03 ENCOUNTER — Encounter (HOSPITAL_COMMUNITY): Payer: Self-pay | Admitting: *Deleted

## 2016-01-03 DIAGNOSIS — B9689 Other specified bacterial agents as the cause of diseases classified elsewhere: Secondary | ICD-10-CM

## 2016-01-03 DIAGNOSIS — N760 Acute vaginitis: Principal | ICD-10-CM

## 2016-01-03 DIAGNOSIS — A599 Trichomoniasis, unspecified: Secondary | ICD-10-CM

## 2016-01-03 MED ORDER — METRONIDAZOLE 500 MG PO TABS: 500.0000 mg | ORAL_TABLET | Freq: Two times a day (BID) | ORAL | 0 refills | Status: DC

## 2016-01-03 NOTE — Telephone Encounter (Signed)
Telephoned patient at home # and discussed negative pap smear results. HPV was negative. Next pap smear due in five years. Pap smear did show trichomoniasis and wet prep also showed bacterial vaginosis. Advised medication to treat both was Flagyl. Advised patient sexual partner would also need to be treated. Advised patient to refrain from alcohol while taking medication and no sexual contact until medication is complete. Advised patient would need to call [facility] for additional imaging of breast. Gave patient number to [facility]. Patient will call to schedule. Patient voiced understanding.

## 2016-01-08 ENCOUNTER — Ambulatory Visit
Admission: RE | Admit: 2016-01-08 | Discharge: 2016-01-08 | Disposition: A | Discharge status: Home / Self Care | Payer: No Typology Code available for payment source | Source: Ambulatory Visit | Attending: Obstetrics and Gynecology | Admitting: Obstetrics and Gynecology

## 2016-01-08 DIAGNOSIS — R928 Other abnormal and inconclusive findings on diagnostic imaging of breast: Secondary | ICD-10-CM

## 2016-01-27 ENCOUNTER — Encounter (HOSPITAL_COMMUNITY): Payer: Self-pay | Admitting: Emergency Medicine

## 2016-01-27 ENCOUNTER — Inpatient Hospital Stay (HOSPITAL_COMMUNITY)
Admission: EM | Admit: 2016-01-27 | Discharge: 2016-01-29 | DRG: 202 | Disposition: A | Discharge status: Home / Self Care | Payer: No Typology Code available for payment source | Attending: Internal Medicine | Admitting: Internal Medicine

## 2016-01-27 ENCOUNTER — Emergency Department (HOSPITAL_COMMUNITY): Payer: No Typology Code available for payment source

## 2016-01-27 DIAGNOSIS — J219 Acute bronchiolitis, unspecified: Principal | ICD-10-CM | POA: Diagnosis present

## 2016-01-27 DIAGNOSIS — I10 Essential (primary) hypertension: Secondary | ICD-10-CM | POA: Diagnosis present

## 2016-01-27 DIAGNOSIS — Z79899 Other long term (current) drug therapy: Secondary | ICD-10-CM

## 2016-01-27 DIAGNOSIS — R062 Wheezing: Secondary | ICD-10-CM

## 2016-01-27 DIAGNOSIS — R0602 Shortness of breath: Secondary | ICD-10-CM

## 2016-01-27 DIAGNOSIS — F1021 Alcohol dependence, in remission: Secondary | ICD-10-CM | POA: Diagnosis present

## 2016-01-27 DIAGNOSIS — Z8249 Family history of ischemic heart disease and other diseases of the circulatory system: Secondary | ICD-10-CM

## 2016-01-27 DIAGNOSIS — E871 Hypo-osmolality and hyponatremia: Secondary | ICD-10-CM | POA: Diagnosis present

## 2016-01-27 DIAGNOSIS — J45901 Unspecified asthma with (acute) exacerbation: Secondary | ICD-10-CM | POA: Diagnosis present

## 2016-01-27 LAB — COMPREHENSIVE METABOLIC PANEL
ALT: 7 U/L — ABNORMAL LOW (ref 14–54)
AST: 15 U/L (ref 15–41)
Albumin: 4.1 g/dL (ref 3.5–5.0)
Alkaline Phosphatase: 99 U/L (ref 38–126)
Anion gap: 8 (ref 5–15)
BUN: 6 mg/dL (ref 6–20)
CHLORIDE: 98 mmol/L — AB (ref 101–111)
CO2: 26 mmol/L (ref 22–32)
Calcium: 9.2 mg/dL (ref 8.9–10.3)
Creatinine, Ser: 0.72 mg/dL (ref 0.44–1.00)
GFR calc Af Amer: >60 mL/min (ref >60)
Glucose, Bld: 100 mg/dL — ABNORMAL HIGH (ref 65–99)
POTASSIUM: 3.7 mmol/L (ref 3.5–5.1)
SODIUM: 132 mmol/L — AB (ref 135–145)
Total Bilirubin: 0.5 mg/dL (ref 0.3–1.2)
Total Protein: 8.2 g/dL — ABNORMAL HIGH (ref 6.5–8.1)

## 2016-01-27 LAB — CBC WITH DIFFERENTIAL/PLATELET
Basophils Absolute: 0 10*3/uL (ref 0.0–0.1)
Basophils Relative: 0 %
Eosinophils Absolute: 1 10*3/uL — ABNORMAL HIGH (ref 0.0–0.7)
Eosinophils Relative: 21 %
HCT: 40.9 % (ref 36.0–46.0)
HEMOGLOBIN: 13.2 g/dL (ref 12.0–15.0)
LYMPHS ABS: 1.4 10*3/uL (ref 0.7–4.0)
LYMPHS PCT: 29 %
MCH: 27.4 pg (ref 26.0–34.0)
MCHC: 32.3 g/dL (ref 30.0–36.0)
MCV: 84.9 fL (ref 78.0–100.0)
MONOS PCT: 13 %
Monocytes Absolute: 0.6 10*3/uL (ref 0.1–1.0)
NEUTROS PCT: 37 %
Neutro Abs: 1.7 10*3/uL (ref 1.7–7.7)
Platelets: 250 10*3/uL (ref 150–400)
RBC: 4.82 MIL/uL (ref 3.87–5.11)
RDW: 14.4 % (ref 11.5–15.5)
WBC: 4.8 10*3/uL (ref 4.0–10.5)

## 2016-01-27 LAB — BRAIN NATRIURETIC PEPTIDE: B Natriuretic Peptide: 36.9 pg/mL (ref 0.0–100.0)

## 2016-01-27 LAB — TROPONIN I: Troponin I: <0.03 ng/mL (ref <0.03)

## 2016-01-27 MED ORDER — ALBUTEROL SULFATE (2.5 MG/3ML) 0.083% IN NEBU: 2.5000 mg | INHALATION_SOLUTION | Freq: Once | RESPIRATORY_TRACT | Status: DC

## 2016-01-27 MED ORDER — ONDANSETRON HCL 4 MG/2ML IJ SOLN: 4.0000 mg | Freq: Four times a day (QID) | INTRAMUSCULAR | Status: DC | PRN

## 2016-01-27 MED ORDER — AMLODIPINE BESYLATE 5 MG PO TABS
5.0000 mg | ORAL_TABLET | Freq: Every day | ORAL | Status: DC
  Administered 2016-01-28 – 2016-01-29 (×2): 5 mg via ORAL
  Filled 2016-01-27 (×2): qty 1

## 2016-01-27 MED ORDER — ALBUTEROL SULFATE (2.5 MG/3ML) 0.083% IN NEBU
5.0000 mg | INHALATION_SOLUTION | Freq: Once | RESPIRATORY_TRACT | Status: AC
  Administered 2016-01-27: 5 mg via RESPIRATORY_TRACT
  Filled 2016-01-27: qty 6

## 2016-01-27 MED ORDER — ACETAMINOPHEN 325 MG PO TABS
650.0000 mg | ORAL_TABLET | Freq: Four times a day (QID) | ORAL | Status: DC | PRN
  Administered 2016-01-28: 650 mg via ORAL
  Filled 2016-01-27: qty 2

## 2016-01-27 MED ORDER — IPRATROPIUM-ALBUTEROL 0.5-2.5 (3) MG/3ML IN SOLN
3.0000 mL | Freq: Four times a day (QID) | RESPIRATORY_TRACT | Status: DC
  Administered 2016-01-28 (×4): 3 mL via RESPIRATORY_TRACT
  Filled 2016-01-27 (×4): qty 3

## 2016-01-27 MED ORDER — ALBUTEROL (5 MG/ML) CONTINUOUS INHALATION SOLN
10.0000 mg/h | INHALATION_SOLUTION | RESPIRATORY_TRACT | Status: DC
  Administered 2016-01-27: 10 mg/h via RESPIRATORY_TRACT
  Filled 2016-01-27: qty 20

## 2016-01-27 MED ORDER — FUROSEMIDE 20 MG PO TABS
20.0000 mg | ORAL_TABLET | Freq: Every day | ORAL | Status: DC
  Administered 2016-01-28 – 2016-01-29 (×2): 20 mg via ORAL
  Filled 2016-01-27 (×2): qty 1

## 2016-01-27 MED ORDER — ACETAMINOPHEN 650 MG RE SUPP
650.0000 mg | Freq: Four times a day (QID) | RECTAL | Status: DC | PRN
Start: 2016-01-27 — End: 2016-01-29

## 2016-01-27 MED ORDER — ALBUTEROL SULFATE (2.5 MG/3ML) 0.083% IN NEBU: 2.5000 mg | INHALATION_SOLUTION | Freq: Four times a day (QID) | RESPIRATORY_TRACT | Status: DC

## 2016-01-27 MED ORDER — PREDNISONE 50 MG PO TABS
50.0000 mg | ORAL_TABLET | Freq: Every day | ORAL | Status: DC
  Administered 2016-01-28: 50 mg via ORAL
  Filled 2016-01-27: qty 1

## 2016-01-27 MED ORDER — ENOXAPARIN SODIUM 60 MG/0.6ML ~~LOC~~ SOLN
60.0000 mg | Freq: Every day | SUBCUTANEOUS | Status: DC
  Administered 2016-01-28: 60 mg via SUBCUTANEOUS
  Filled 2016-01-27: qty 0.6

## 2016-01-27 MED ORDER — IPRATROPIUM BROMIDE 0.02 % IN SOLN: 0.5000 mg | Freq: Four times a day (QID) | RESPIRATORY_TRACT | Status: DC

## 2016-01-27 MED ORDER — ALBUTEROL SULFATE (2.5 MG/3ML) 0.083% IN NEBU: 2.5000 mg | INHALATION_SOLUTION | RESPIRATORY_TRACT | Status: DC | PRN

## 2016-01-27 MED ORDER — METHYLPREDNISOLONE SODIUM SUCC 125 MG IJ SOLR
125.0000 mg | Freq: Once | INTRAMUSCULAR | Status: AC
  Administered 2016-01-27: 125 mg via INTRAVENOUS
  Filled 2016-01-27: qty 2

## 2016-01-27 MED ORDER — ONDANSETRON HCL 4 MG PO TABS: 4.0000 mg | ORAL_TABLET | Freq: Four times a day (QID) | ORAL | Status: DC | PRN

## 2016-01-27 NOTE — Progress Notes (Signed)
Lovenox per Pharmacy for DVT Prophylaxis    Pharmacy has been consulted from dosing enoxaparin (lovenox) in this patient for DVT prophylaxis.  The pharmacist has reviewed pertinent labs (Hgb _13.2__; PLT_250__), patient weight (_134__kg) and renal function (CrCl_120__mL/min) and decided that enoxaparin _60_mg SQ Q 24__Hrs is appropriate for this patient.  The pharmacy department will sign off at this time.  Please reconsult pharmacy if status changes or for further issues.  Thank you  Cyndia Diver PharmD, BCPS  01/27/2016, 11:18 PM

## 2016-01-27 NOTE — ED Provider Notes (Signed)
[facility] DEPT Provider Note   CSN: [id number] Arrival date & time: 01/27/16  1826     History   Chief Complaint Chief Complaint  Patient presents with  . Shortness of Breath    HPI Caroline Harvey is a 55 y.o. female.  55 yo AA female with history of alcohol abuse in remission presents with worsening SOB for the past 1 hour. Patient states she has been sob with wheezing for the past 2 week. He also endorses a cough. She was seen approx 1 week ago at [facility] and given prescription for z pack and albuterol inhaler. She finished her z pack. She has been using her albuterol inhaler several times a day for the past week with some relief. She states she ran out this morning of her inhaler and the sob has gotten progressively worse. She also endorses chest congestion, rhinorrhea, nasal congestion and productive cough. She is coughing up white sputum. She has not tried any otc treatment. Nothing make better or worse. No history of asthma. Denies fever, chills, ha, vision changes, n/v/d, abd pain, urinary symptoms, change in bowel habits. She endorses leg swelling that has been present for the past year. No recent surgeries/hosp, prolonged leg immobilization, hormone therapy, tobacco use, history of dvt, calf swelling or pain.          Past Medical History:  Diagnosis Date  . Alcohol abuse   . Anemia   . Hypertension   . Hyponatremia 09/2014    Patient Active Problem List   Diagnosis Date Noted  . Hyponatremia 10/03/2014  . Nausea vomiting and diarrhea 10/03/2014  . Leukopenia 10/03/2014  . Acute kidney injury ([place]) 10/03/2014  . 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 FRACTURE, ANKLE, RIGHT 07/05/2009  . GERD 02/07/2009  . BACK PAIN, LUMBAR 02/24/2008  . GOUT, UNSPECIFIED 11/25/2007  . Alcohol abuse 11/25/2007  . SUPERFICIAL THROMBOPHLEBITIS 03/18/2007  . METATARSALGIA 03/18/2007  . VARICOSE VEIN 02/18/2007  . Pain in limb 02/18/2007  . B12 DEFICIENCY 01/12/2007  . OBESITY NOS  01/12/2007  . HYPERTENSION, BENIGN 01/12/2007  . EDEMA LEG 01/12/2007    Past Surgical History:  Procedure Laterality Date  . ANKLE SURGERY Right    x  2    OB History    Gravida Para Term Preterm AB Living   1 1 1     1    SAB TAB Ectopic Multiple Live Births                   Home Medications    Prior to Admission medications   Medication Sig Start Date End Date Taking? Authorizing Provider  azithromycin (ZITHROMAX Z-PAK) 250 MG tablet Take as directed on pack Patient not taking: Reported on 12/28/2015 07/05/15   Billy Fischer, MD  guaiFENesin-codeine [facility]) 100-10 MG/5ML syrup Take 10 mLs by mouth 4 (four) times daily as needed for cough. Patient not taking: Reported on 12/28/2015 07/05/15   Billy Fischer, MD  ipratropium (ATROVENT) 0.06 % nasal spray Place 2 sprays into both nostrils 4 (four) times daily. Patient not taking: Reported on 12/28/2015 07/05/15   Billy Fischer, MD  metroNIDAZOLE (FLAGYL) 500 MG tablet Take 1 tablet (500 mg total) by mouth 2 (two) times daily. 01/03/16   Peggy Constant, MD  pantoprazole (PROTONIX) 40 MG tablet Take 1 tablet (40 mg total) by mouth daily with breakfast. Patient not taking: Reported on 12/28/2015 10/07/14   Reyne Dumas, MD  predniSONE (DELTASONE) 50 MG tablet 1 tab daily for 2  days then 1/2 tab daily for 2 days. Start on thurs 2/9. Patient not taking: Reported on 12/28/2015 07/05/15   Billy Fischer, MD  thiamine 100 MG tablet Take 1 tablet (100 mg total) by mouth daily. Patient not taking: Reported on 12/28/2015 10/07/14   Reyne Dumas, MD    Family History Family History  Problem Relation Age of Onset  . Hypertension Mother   . Breast cancer Paternal Aunt   . Breast cancer Paternal Aunt     Social History Social History  Substance Use Topics  . Smoking status: Never Smoker  . Smokeless tobacco: Never Used  . Alcohol use No     Comment: Quit May 20 2015     Allergies   Review of patient's allergies indicates no known  allergies.   Review of Systems Review of Systems  Constitutional: Negative for chills and fever.  HENT: Positive for congestion, postnasal drip, rhinorrhea and sinus pressure. Negative for ear pain and sore throat.   Eyes: Negative for pain and visual disturbance.  Respiratory: Positive for cough, shortness of breath and wheezing.   Cardiovascular: Positive for leg swelling. Negative for chest pain and palpitations.  Gastrointestinal: Negative for abdominal pain, diarrhea, nausea and vomiting.  Genitourinary: Negative for dysuria, flank pain, frequency, hematuria and urgency.  Musculoskeletal: Negative for arthralgias and back pain.  Skin: Negative for color change and rash.  Neurological: Negative for dizziness, syncope, weakness, light-headedness, numbness and headaches.  All other systems reviewed and are negative.    Physical Exam Updated Vital Signs BP 149/71 (BP Location: Left Arm)   Pulse 93   Temp 97.4 F (36.3 C) (Axillary)   Resp 18   Ht 5\' 9"  (1.753 m)   Wt 134.7 kg   LMP 12/14/2010   SpO2 100%   BMI 43.86 kg/m   Physical Exam  Constitutional: She appears well-developed and well-nourished. She appears distressed (mild).  Patient appear to be having difficulty breathing  HENT:  Head: Normocephalic and atraumatic.  Mouth/Throat: Oropharynx is clear and moist.  Eyes: Conjunctivae are normal. Pupils are equal, round, and reactive to light. Right eye exhibits no discharge. Left eye exhibits no discharge. No scleral icterus.  Neck: Normal range of motion. Neck supple. No thyromegaly present.  Cardiovascular: Normal rate, regular rhythm, normal heart sounds and intact distal pulses.   Pulses:      Dorsalis pedis pulses are 2+ on the right side, and 2+ on the left side.  Pulmonary/Chest: Breath sounds normal. No accessory muscle usage. No apnea. She is in respiratory distress (mild).  Diminished breathe sound. Wheezes and course sounds noted throughout all lung fields.  Patient is tachypneic with mild increase work of breathing. No accessory muscle use. Patient speaking in complete sentences. O2 sat were 100% on duo neb. No rales noted.  Abdominal: Soft. Bowel sounds are normal. She exhibits no distension and no mass. There is no tenderness. There is no guarding.  Musculoskeletal: Normal range of motion.  Lymphadenopathy:    She has no cervical adenopathy.  Neurological: She is alert.  Skin: Skin is warm. Capillary refill takes less than 2 seconds. She is diaphoretic.  Trace pitting edema of lower extremities bilaterally which is baseline per patient, no pain with palpation of calf.  Vitals reviewed.    ED Treatments / Results  Labs (all labs ordered are listed, but only abnormal results are displayed) Labs Reviewed  COMPREHENSIVE METABOLIC PANEL - Abnormal; Notable for the following:       Result  Value   Sodium 132 (*)    Chloride 98 (*)    Glucose, Bld 100 (*)    Total Protein 8.2 (*)    ALT 7 (*)    All other components within normal limits  CBC WITH DIFFERENTIAL/PLATELET - Abnormal; Notable for the following:    Eosinophils Absolute 1.0 (*)    All other components within normal limits  BRAIN NATRIURETIC PEPTIDE  TROPONIN I    EKG  EKG Interpretation  Date/Time:  Saturday January 27 2016 18:42:41 EDT Ventricular Rate:  83 PR Interval:    QRS Duration: 104 QT Interval:  415 QTC Calculation: 488 R Axis:   35 Text Interpretation:  Sinus rhythm Anterior infarct, old Nonspecific T abnormalities, lateral leads Confirmed by Lita Mains  MD, DAVID ([id number]) on 01/27/2016 7:48:14 PM       Radiology Dg Chest 2 View  Result Date: 01/27/2016 CLINICAL DATA:  Shortness of breath and wheezing. Recently prescribed a Z-Pak with no relief. History of hypertension. Nonsmoker. EXAM: CHEST  2 VIEW COMPARISON:  07/05/2015 FINDINGS: The heart size and mediastinal contours are within normal limits. Both lungs are clear. The visualized skeletal structures are  unremarkable. IMPRESSION: No active cardiopulmonary disease. Electronically Signed   By: Lucienne Capers M.D.   On: 01/27/2016 19:39    Procedures Procedures (including critical care time)  Medications Ordered in ED Medications  albuterol (PROVENTIL,VENTOLIN) solution continuous neb (10 mg/hr Nebulization New Bag/Given 01/27/16 1931)  albuterol (PROVENTIL) (2.5 MG/3ML) 0.083% nebulizer solution 5 mg (5 mg Nebulization Given 01/27/16 1834)  methylPREDNISolone sodium succinate (SOLU-MEDROL) 125 mg/2 mL injection 125 mg (125 mg Intravenous Given 01/27/16 1856)     Initial Impression / Assessment and Plan / ED Course  I have reviewed the triage vital signs and the nursing notes.  Pertinent labs & imaging results that were available during my care of the patient were reviewed by me and considered in my medical decision making (see chart for details).  Clinical Course  Patient presents to ED with increased work of breathing. Afebrile, no tachycardia. Patient was noted to be tachypneic and hypertensive in ED. On initial evaluation patient's sats were in the upper 80s. She was given continuous albuterol and Solu-Medrol. Patient noticed significant improvement. Sats improved to the upper 90s. Lungs with slight improvement diffuse wheeze continue. Labs were unremarkable. BNP normal. 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 No acute changes.  X-ray showed focal opacities concerning for pneumonia or edema. Discussed plan of care doctor [facility]. Due to patient continuing to have wheeze with not history of asthma will admit. Patient admitted to hospital for observation. Discussed with hospital medicine and they agreed to admit.  Final Clinical Impressions(s) / ED Diagnoses   Final diagnoses:  SOB (shortness of breath)  Wheezing    New Prescriptions New Prescriptions   No medications on file     Doristine Devoid, PA-C 01/28/16 0229    Julianne Rice, MD 01/28/16  [phone]

## 2016-01-27 NOTE — H&P (Signed)
History and Physical  Patient Name: Caroline Harvey     [id number]    DOB: Jun 07, 1960    DOA: 01/27/2016 PCP: Aretta Nip, MD she states PCP is at the [facility] on [address]  Patient coming from: Home  Chief Complaint: Dyspnea, wheezing  HPI: Caroline Harvey is a 55 y.o. 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  Over the last two weeks, the patient has noticed gradually progressive dyspnea, cough and wheezing. This was preceded by new nasal congestion.  About a week ago, she saw her PCP and was prescribed a Zpak and albuterol.  She took the Zpak without improvement, and has been using the albuterol daily, >6 times per day.   Today,she went to the store without her albuterol and was short of breath the whole time.  When she returned home, she was very out of breath, took her albuterol but still continued to feel short of breath and wheezy so she came to the ER.  ED course: -Afebrile, heart rate 90s, respirations 32, hypertensive, pulse oximetry low 90s on room air -Na 132 (chronic), K 3.7, Cr 0.72 (baseline 0.6-0.7), WBC 4.8K, Hgb 13.2 -BNP 36pg/mL -CXR showed no focal opacity or pneumonia or edema -She was very wheezy, given albuterol continuous x1hr and Solu-medrol and TRH were asked to evaluate for admission     ROS: Review of Systems  Constitutional: Negative for chills and fever.  Respiratory: Positive for cough, sputum production (whitish), shortness of breath and wheezing. Negative for hemoptysis.   Cardiovascular: Positive for orthopnea and leg swelling (chronic, no changes). Negative for chest pain and PND.  All other systems reviewed and are negative.         Past Medical History:  Diagnosis Date  . Alcohol abuse   . Anemia   . Hypertension   . Hyponatremia 09/2014    Past Surgical History:  Procedure Laterality Date  . ANKLE SURGERY Right    x  2    Social  History: Patient lives alone.  The patient walks unassisted. She has been alcohol free for 7 months.  She does not smoke, never has.    No Known Allergies  Family history: family history includes Breast cancer in her paternal aunt and paternal aunt; Hypertension in her mother.  No family history of asthma or lung disease.  Prior to Admission medications   Medication Sig Start Date End Date Taking? Authorizing Provider  amLODipine (NORVASC) 5 MG tablet Take 5 mg by mouth daily.   Yes Historical Provider, MD  furosemide (LASIX) 20 MG tablet Take 20 mg by mouth daily.   Yes Historical Provider, MD  azithromycin (ZITHROMAX Z-PAK) 250 MG tablet Take as directed on pack Patient not taking: Reported on 12/28/2015 07/05/15   Billy Fischer, MD  ipratropium (ATROVENT) 0.06 % nasal spray Place 2 sprays into both nostrils 4 (four) times daily. Patient not taking: Reported on 12/28/2015 07/05/15   Billy Fischer, MD  metroNIDAZOLE (FLAGYL) 500 MG tablet Take 1 tablet (500 mg total) by mouth 2 (two) times daily. 01/03/16   Mora Bellman, MD       Physical Exam: BP 101/69   Pulse 97   Temp 97.4 F (36.3 C) (Axillary)   Resp 12   Ht 5\' 9"  (1.753 m)   Wt 134.7 kg (297 lb)   LMP 12/14/2010   SpO2 99%   BMI 43.86 kg/m  General appearance:  Well-developed, obese adult female, alert and in no acute distress, mild respiratory distress, on nasal cannula.   Eyes: Anicteric, conjunctiva pink, lids and lashes normal.     ENT: No nasal deformity, discharge, or epistaxis.  OP moist without lesions.   Lymph: No cervical or supraclavicular lymphadenopathy. Skin: Warm and dry.  No jaundice.  No suspicious rashes or lesions. Cardiac: RRR, nl S1-S2, no murmurs appreciated.  Capillary refill is brisk.  JVP normal.  Trace pretibial edema LE edema, subtle more on left.  Radial and DP pulses 2+ and symmetric. Respiratory: Mild increased WOB, no accessorymuscle use, speaking in sentences. Marked inspiratory and expiratory  wheezes bilatearlly.  No rales.   GI: Abdomen soft without rigidity.  No TTP. No ascites, distension, hepatosplenomegaly.   MSK: No deformities or effusions.  No clubbing/cyanosis. Neuro: Cranial nerves normal. Sensorium intact and responding to questions, attention normal.  Speech is fluent.  Moves all extremities equally and with normal coordination.    Psych: Affect normal.  Judgment and insight appear normal.       Labs on Admission:  I have personally reviewed following labs and imaging studies: CBC:  Recent Labs Lab 01/27/16 1900  WBC 4.8  NEUTROABS 1.7  HGB 13.2  HCT 40.9  MCV 84.9  PLT [id number]   Basic Metabolic Panel:  Recent Labs Lab 01/27/16 1900  NA 132*  K 3.7  CL 98*  CO2 26  GLUCOSE 100*  BUN 6  CREATININE 0.72  CALCIUM 9.2   GFR: Estimated Creatinine Clearance: 120.2 mL/min (by C-G formula based on SCr of 0.8 mg/dL).  Liver Function Tests:  Recent Labs Lab 01/27/16 1900  AST 15  ALT 7*  ALKPHOS 99  BILITOT 0.5  PROT 8.2*  ALBUMIN 4.1   Cardiac Enzymes:  Recent Labs Lab 01/27/16 1700  TROPONINI <0.03        Radiological Exams on Admission: Personally reviewed: Dg Chest 2 View  Result Date: 01/27/2016 CLINICAL DATA:  Shortness of breath and wheezing. Recently prescribed a Z-Pak with no relief. History of hypertension. Nonsmoker. EXAM: CHEST  2 VIEW COMPARISON:  07/05/2015 FINDINGS: The heart size and mediastinal contours are within normal limits. Both lungs are clear. The visualized skeletal structures are unremarkable. IMPRESSION: No active cardiopulmonary disease. Electronically Signed   By: Lucienne Capers M.D.   On: 01/27/2016 19:39    EKG: Independently reviewed. Rate 83, QTc 488, diffuse T flattening, unchanged from previous.    Assessment/Plan 1. Acute bronchiolitis vs asthma flare vs COPD:  No prior history of asthma/atopy, no previous smoking history, would be atypical for either asthma or COPD.  Sounds as if there was some URI  trigger in the last month. Wells score 0, low suspicion for PE. No clinical pneumonia, no improvement with Zpak. -Prednisone 50 mg daily for 5 days -Albuterol-ipratropium scheduled -Albuterol PRN -RT consult to teach inhaler use   2. HTN:  -Continue amlodipine and furosemide  3. Hyponatremia:  Chronic, unchanged.     DVT prophylaxis: Lovenox  Code Status: FULL  Family Communication: Friend at bedside. Diagnosis discussed, allquestions answered.    Disposition Plan: Anticipate Bronchodilators overnight, begin prednisone burst, likely discharge tomorrow if clinically improving. Consults called: None Admission status: OBS, med surg    Medical decision making: Patient seen at 10:36 PM on 01/27/2016.  The patient was discussed with Dr. Lita Mains. What exists of the patient's chart was reviewed in depth.  Clinical condition: stable.        [facility] Pager [phone]

## 2016-01-27 NOTE — ED Notes (Signed)
Called floor to give report, nurse to call back.

## 2016-01-27 NOTE — ED Notes (Signed)
Patient has expiratory/inspiratory wheezing and cough that sounds wet x1 week.  Patient was on abx and has an inhaler per her doctor.  Patient seems has upper respiratory/nasal congestion and cough.

## 2016-01-27 NOTE — ED Triage Notes (Signed)
Pt complaint of SOB worsening over past hour. I/E wheezing noted.

## 2016-01-27 NOTE — ED Notes (Signed)
Unable to draw labs with IV start. Insufficient sample.

## 2016-01-28 DIAGNOSIS — J219 Acute bronchiolitis, unspecified: Principal | ICD-10-CM

## 2016-01-28 DIAGNOSIS — J45901 Unspecified asthma with (acute) exacerbation: Secondary | ICD-10-CM | POA: Diagnosis present

## 2016-01-28 DIAGNOSIS — I10 Essential (primary) hypertension: Secondary | ICD-10-CM

## 2016-01-28 LAB — BASIC METABOLIC PANEL
ANION GAP: 10 (ref 5–15)
BUN: 7 mg/dL (ref 6–20)
CALCIUM: 9.6 mg/dL (ref 8.9–10.3)
CO2: 25 mmol/L (ref 22–32)
CREATININE: 0.67 mg/dL (ref 0.44–1.00)
Chloride: 99 mmol/L — ABNORMAL LOW (ref 101–111)
GFR calc Af Amer: >60 mL/min (ref >60)
GFR calc non Af Amer: >60 mL/min (ref >60)
GLUCOSE: 180 mg/dL — AB (ref 65–99)
Potassium: 3.9 mmol/L (ref 3.5–5.1)
Sodium: 134 mmol/L — ABNORMAL LOW (ref 135–145)

## 2016-01-28 MED ORDER — BUDESONIDE 0.25 MG/2ML IN SUSP
0.2500 mg | Freq: Two times a day (BID) | RESPIRATORY_TRACT | Status: DC
  Administered 2016-01-28 – 2016-01-29 (×3): 0.25 mg via RESPIRATORY_TRACT
  Filled 2016-01-28 (×3): qty 2

## 2016-01-28 MED ORDER — ARFORMOTEROL TARTRATE 15 MCG/2ML IN NEBU
15.0000 ug | INHALATION_SOLUTION | Freq: Two times a day (BID) | RESPIRATORY_TRACT | Status: DC
  Administered 2016-01-28 – 2016-01-29 (×3): 15 ug via RESPIRATORY_TRACT
  Filled 2016-01-28 (×3): qty 2

## 2016-01-28 MED ORDER — IPRATROPIUM-ALBUTEROL 0.5-2.5 (3) MG/3ML IN SOLN
3.0000 mL | Freq: Three times a day (TID) | RESPIRATORY_TRACT | Status: DC
  Administered 2016-01-29 (×2): 3 mL via RESPIRATORY_TRACT
  Filled 2016-01-28 (×2): qty 3

## 2016-01-28 MED ORDER — PREDNISONE 50 MG PO TABS
50.0000 mg | ORAL_TABLET | Freq: Every day | ORAL | Status: DC
  Administered 2016-01-29: 50 mg via ORAL
  Filled 2016-01-28: qty 1

## 2016-01-28 MED ORDER — IPRATROPIUM-ALBUTEROL 0.5-2.5 (3) MG/3ML IN SOLN: 3.0000 mL | RESPIRATORY_TRACT | Status: DC | PRN

## 2016-01-28 NOTE — Progress Notes (Signed)
This shift pt arrived to unit room 1501. Alert and oriented x 4, dyspnea with exertion. VS taken. Pt oriented to room and callbell with no complications.initial assessment completed. Will continue to monitor. Pt guide at the bedside

## 2016-01-28 NOTE — Progress Notes (Signed)
Patient ID: Caroline Harvey, female   DOB: 1960-06-17, 55 y.o.   MRN: [id number]  PROGRESS NOTE    Caroline Harvey  [id number] DOB: Mar 23, 1961 DOA: 01/27/2016  PCP: Aretta Nip, MD   Brief Narrative:  55 -year-old female with past medical history significant for alcohol use, sustained remission, hypertension who presented to [facility] with worsening shortness of breath over past 2 weeks prior to this admission associated with intermittent productive cough, wheezing. She does not have previous diagnosis of asthma. She saw primary care physician in about one week prior to this admission and was prescribed Z-Pak and albuterol but she has not had significant symptomatic relief. In ED, patient was hemodynamically stable. HR was in 90's, RR 32, oxygen saturation in the low 90s on room air. Chest x-ray showed no focal opacities or pneumonia. She was given continuous nebulizer treatment, Solu-Medrol but continued to have shortness of breath and wheezing for which reason she was admitted for further management of acute asthma exacerbation.    Assessment & Plan:   Principal Problem:   Bronchiolitis / acute asthma exacerbation - Respiratory status is stable but she is wheezing and has coarse breath sounds - We will add DuoNeb every 2 hours as needed for shortness of breath or wheezing - Continue DuoNeb 4 times daily scheduled and added Brovana and Pulmicort nebulizer twice daily - She has poor IV access so will continue prednisone 50 mg daily  Active Problems:   Essential hypertension  - Continue Norvasc 5 mg daily   DVT prophylaxis: SCDs bilaterally Code Status: full code  Family Communication: No family at the bedside Disposition Plan: home likely in next 1-2 days   Consultants:   None   Procedures:   None   Antimicrobials:   None    Subjective: Coughing but no overnight events.   Objective: Vitals:   01/28/16 0047 01/28/16 0509 01/28/16 0547 01/28/16  0759  BP: (!) 160/91 (!) 147/85 (!) 139/51   Pulse:  83 60   Resp:  19 18   Temp:  98.2 F (36.8 C) 97.6 F (36.4 C)   TempSrc:  Oral Oral   SpO2:  99% 98% 99%  Weight:      Height:        Intake/Output Summary (Last 24 hours) at 01/28/16 1043 Last data filed at 01/28/16 0700  Gross per 24 hour  Intake              600 ml  Output              900 ml  Net             -300 ml   Filed Weights   01/27/16 1830 01/27/16 2320  Weight: 134.7 kg (297 lb) 131.1 kg (289 lb)    Examination:  General exam: Appears calm and comfortable  Respiratory system: Rhonchorous breath sounds, mild expiratory wheezing  Cardiovascular system: S1 & S2 heard, RRR. No JVD, murmurs, rubs, gallops or clicks. No pedal edema. Gastrointestinal system: Abdomen is nondistended, soft and nontender. No organomegaly or masses felt. Normal bowel sounds heard. Central nervous system: Alert and oriented. No focal neurological deficits. Extremities: Symmetric 5 x 5 power. Skin: No rashes, lesions or ulcers Psychiatry: Judgement and insight appear normal. Mood & affect appropriate.   Data Reviewed: I have personally reviewed following labs and imaging studies  CBC:  Recent Labs Lab 01/27/16 1900  WBC 4.8  NEUTROABS 1.7  HGB 13.2  HCT 40.9  MCV  84.9  PLT [id number]   Basic Metabolic Panel:  Recent Labs Lab 01/27/16 1900 01/28/16 0603  NA 132* 134*  K 3.7 3.9  CL 98* 99*  CO2 26 25  GLUCOSE 100* 180*  BUN 6 7  CREATININE 0.72 0.67  CALCIUM 9.2 9.6   GFR: Estimated Creatinine Clearance: 118.4 mL/min (by C-G formula based on SCr of 0.8 mg/dL). Liver Function Tests:  Recent Labs Lab 01/27/16 1900  AST 15  ALT 7*  ALKPHOS 99  BILITOT 0.5  PROT 8.2*  ALBUMIN 4.1   No results for input(s): LIPASE, AMYLASE in the last 168 hours. No results for input(s): AMMONIA in the last 168 hours. Coagulation Profile: No results for input(s): INR, PROTIME in the last 168 hours. Cardiac Enzymes:  Recent  Labs Lab 01/27/16 1700  TROPONINI <0.03   BNP (last 3 results) No results for input(s): PROBNP in the last 8760 hours. HbA1C: No results for input(s): HGBA1C in the last 72 hours. CBG: No results for input(s): GLUCAP in the last 168 hours. Lipid Profile: No results for input(s): CHOL, HDL, LDLCALC, TRIG, CHOLHDL, LDLDIRECT in the last 72 hours. Thyroid Function Tests: No results for input(s): TSH, T4TOTAL, FREET4, T3FREE, THYROIDAB in the last 72 hours. Anemia Panel: No results for input(s): VITAMINB12, FOLATE, FERRITIN, TIBC, IRON, RETICCTPCT in the last 72 hours. Urine analysis:    Component Value Date/Time   COLORURINE YELLOW 10/03/2014 1623   APPEARANCEUR CLEAR 10/03/2014 1623   LABSPEC 1.008 10/03/2014 1623   PHURINE 5.0 10/03/2014 1623   GLUCOSEU NEGATIVE 10/03/2014 1623   HGBUR NEGATIVE 10/03/2014 1623   HGBUR trace-intact 12/18/2009 0908   BILIRUBINUR NEGATIVE 10/03/2014 1623   KETONESUR NEGATIVE 10/03/2014 1623   PROTEINUR NEGATIVE 10/03/2014 1623   UROBILINOGEN 0.2 10/03/2014 1623   NITRITE NEGATIVE 10/03/2014 1623   LEUKOCYTESUR NEGATIVE 10/03/2014 1623   Sepsis Labs: @LABRCNTIP (procalcitonin:4,lacticidven:4)   )No results found for this or any previous visit (from the past 240 hour(s)).    Radiology Studies: Dg Chest 2 View Result Date: 01/27/2016 No active cardiopulmonary disease. Electronically Signed   By: Lucienne Capers M.D.   On: 01/27/2016 19:39      Scheduled Meds: . amLODipine  5 mg Oral Daily  . arformoterol  15 mcg Nebulization BID  . budesonide (PULMICORT) nebulizer solution  0.25 mg Nebulization BID  . furosemide  20 mg Oral Daily  . ipratropium-albuterol  3 mL Nebulization QID  . [START ON 01/29/2016] predniSONE  50 mg Oral Q breakfast   Continuous Infusions:    LOS: 0 days    Time spent: 25 minutes  Greater than 50% of the time spent on counseling and coordinating the care.   Leisa Lenz, MD [facility] Pager  [phone]  If 7PM-7AM, please contact night-coverage [email or web address] Password [facility] 01/28/2016, 10:43 AM

## 2016-01-28 NOTE — Progress Notes (Signed)
OK to leave IV out per Dr. Charlies Silvers.

## 2016-01-29 MED ORDER — HYDROCOD POLST-CPM POLST ER 10-8 MG/5ML PO SUER
5.0000 mL | Freq: Two times a day (BID) | ORAL | Status: DC
  Administered 2016-01-29: 5 mL via ORAL
  Filled 2016-01-29: qty 5

## 2016-01-29 MED ORDER — HYDROCOD POLST-CPM POLST ER 10-8 MG/5ML PO SUER: 5.0000 mL | Freq: Two times a day (BID) | ORAL | 0 refills | Status: DC | PRN

## 2016-01-29 MED ORDER — PREDNISONE 5 MG PO TABS: 50.0000 mg | ORAL_TABLET | Freq: Every day | ORAL | 0 refills | Status: DC

## 2016-01-29 MED ORDER — ALBUTEROL SULFATE HFA 108 (90 BASE) MCG/ACT IN AERS: 1.0000 | INHALATION_SPRAY | Freq: Four times a day (QID) | RESPIRATORY_TRACT | 1 refills | Status: DC | PRN

## 2016-01-29 NOTE — Discharge Instructions (Signed)
Asthma, Adult Asthma is a recurring condition in which the airways tighten and narrow. Asthma can make it difficult to breathe. It can cause coughing, wheezing, and shortness of breath. Asthma episodes, also called asthma attacks, range from minor to life-threatening. Asthma cannot be cured, but medicines and lifestyle changes can help control it. CAUSES Asthma is believed to be caused by inherited (genetic) and environmental factors, but its exact cause is unknown. Asthma may be triggered by allergens, lung infections, or irritants in the air. Asthma triggers are different for each person. Common triggers include:   Animal dander.  Dust mites.  Cockroaches.  Pollen from trees or grass.  Mold.  Smoke.  Air pollutants such as dust, household cleaners, hair sprays, aerosol sprays, paint fumes, strong chemicals, or strong odors.  Cold air, weather changes, and winds (which increase molds and pollens in the air).  Strong emotional expressions such as crying or laughing hard.  Stress.  Certain medicines (such as aspirin) or types of drugs (such as beta-blockers).  Sulfites in foods and drinks. Foods and drinks that may contain sulfites include dried fruit, potato chips, and sparkling grape juice.  Infections or inflammatory conditions such as the flu, a cold, or an inflammation of the nasal membranes (rhinitis).  Gastroesophageal reflux disease (GERD).  Exercise or strenuous activity. SYMPTOMS Symptoms may occur immediately after asthma is triggered or many hours later. Symptoms include:  Wheezing.  Excessive nighttime or early morning coughing.  Frequent or severe coughing with a common cold.  Chest tightness.  Shortness of breath. DIAGNOSIS  The diagnosis of asthma is made by a review of your medical history and a physical exam. Tests may also be performed. These may include:  Lung function studies. These tests show how much air you breathe in and out.  Allergy  tests.  Imaging tests such as X-rays. TREATMENT  Asthma cannot be cured, but it can usually be controlled. Treatment involves identifying and avoiding your asthma triggers. It also involves medicines. There are 2 classes of medicine used for asthma treatment:   Controller medicines. These prevent asthma symptoms from occurring. They are usually taken every day.  Reliever or rescue medicines. These quickly relieve asthma symptoms. They are used as needed and provide short-term relief. Your health care provider will help you create an asthma action plan. An asthma action plan is a written plan for managing and treating your asthma attacks. It includes a list of your asthma triggers and how they may be avoided. It also includes information on when medicines should be taken and when their dosage should be changed. An action plan may also involve the use of a device called a peak flow meter. A peak flow meter measures how well the lungs are working. It helps you monitor your condition. HOME CARE INSTRUCTIONS   Take medicines only as directed by your health care provider. Speak with your health care provider if you have questions about how or when to take the medicines.  Use a peak flow meter as directed by your health care provider. Record and keep track of readings.  Understand and use the action plan to help minimize or stop an asthma attack without needing to seek medical care.  Control your home environment in the following ways to help prevent asthma attacks:  Do not smoke. Avoid being exposed to secondhand smoke.  Change your heating and air conditioning filter regularly.  Limit your use of fireplaces and wood stoves.  Get rid of pests (such as roaches   and mice) and their droppings.  Throw away plants if you see mold on them.  Clean your floors and dust regularly. Use unscented cleaning products.  Try to have someone else vacuum for you regularly. Stay out of rooms while they are  being vacuumed and for a short while afterward. If you vacuum, use a dust mask from a hardware store, a double-layered or microfilter vacuum cleaner bag, or a vacuum cleaner with a HEPA filter.  Replace carpet with wood, tile, or vinyl flooring. Carpet can trap dander and dust.  Use allergy-proof pillows, mattress covers, and box spring covers.  Wash bed sheets and blankets every week in hot water and dry them in a dryer.  Use blankets that are made of polyester or cotton.  Clean bathrooms and kitchens with bleach. If possible, have someone repaint the walls in these rooms with mold-resistant paint. Keep out of the rooms that are being cleaned and painted.  Wash hands frequently. SEEK MEDICAL CARE IF:   You have wheezing, shortness of breath, or a cough even if taking medicine to prevent attacks.  The colored mucus you cough up (sputum) is thicker than usual.  Your sputum changes from clear or white to yellow, green, gray, or bloody.  You have any problems that may be related to the medicines you are taking (such as a rash, itching, swelling, or trouble breathing).  You are using a reliever medicine more than 2-3 times per week.  Your peak flow is still at 50-79% of your personal best after following your action plan for 1 hour.  You have a fever. SEEK IMMEDIATE MEDICAL CARE IF:   You seem to be getting worse and are unresponsive to treatment during an asthma attack.  You are short of breath even at rest.  You get short of breath when doing very little physical activity.  You have difficulty eating, drinking, or talking due to asthma symptoms.  You develop chest pain.  You develop a fast heartbeat.  You have a bluish color to your lips or fingernails.  You are light-headed, dizzy, or faint.  Your peak flow is less than 50% of your personal best.   This information is not intended to replace advice given to you by your health care provider. Make sure you discuss any  questions you have with your health care provider.   Document Released: 05/13/2005 Document Revised: 02/01/2015 Document Reviewed: 12/10/2012 [employer] Interactive Patient Education 2016 [employer]  

## 2016-01-29 NOTE — Discharge Summary (Signed)
Physician Discharge Summary  Randall Mega [id number] DOB: February 06, 1961 DOA: 01/27/2016  PCP: Aretta Nip, MD  Admit date: 01/27/2016 Discharge date: 01/29/2016  Recommendations for Outpatient Follow-up:  1. Taper down prednisone starting from 50 mg a day, taper down by 5 mg a day down to 0 mg. for ex, today 50 mg, tomorrow 45 mg, then 40 mg the following day and etc...  Discharge Diagnoses:  Principal Problem:   Bronchiolitis Active Problems:   HYPERTENSION, BENIGN   Hyponatremia   Asthma exacerbation    Discharge Condition: stable   Diet recommendation: as tolerated   History of present illness:  55 -year-old female with past medical history significant for alcohol use, sustained remission, hypertension who presented to [facility] long hospital with worsening shortness of breath over past 2 weeks prior to this admission associated with intermittent productive cough, wheezing. She does not have previous diagnosis of asthma. She saw primary care physician in about one week prior to this admission and was prescribed Z-Pak and albuterol but she has not had significant symptomatic relief. In ED, patient was hemodynamically stable. HR was in 90's, RR 32, oxygen saturation in the low 90s on room air. Chest x-ray showed no focal opacities or pneumonia. She was given continuous nebulizer treatment, Solu-Medrol but continued to have shortness of breath and wheezing for which reason she was admitted for further management of acute asthma exacerbation.  Hospital Course:    Assessment & Plan:   Principal Problem:   Bronchiolitis / acute asthma exacerbation - Respiratory status is stable - Continue prednisone as prescribe (Taper down prednisone starting from 50 mg a day, taper down by 5 mg a day down to 0 mg. for ex, today 50 mg, tomorrow 45 mg, then 40 mg the following day and etc...) - Continue albuterol inhaler every 4-6 hours as needed   Active Problems:   Essential hypertension   - Continue Norvasc 5 mg daily   DVT prophylaxis: SCDs bilaterally Code Status: full code  Family Communication: No family at the bedside    Consultants:   None   Procedures:   None   Antimicrobials:   None    Signed:  Leisa Lenz, MD  [facility] 01/29/2016, 1:00 PM  Pager #: [phone]  Time spent in minutes: less than 30 minutes   Discharge Exam: Vitals:   01/29/16 0750 01/29/16 0911  BP:  (!) 158/78  Pulse: 88   Resp: 17   Temp:     Vitals:   01/28/16 2337 01/29/16 0651 01/29/16 0750 01/29/16 0911  BP: (!) 168/66 (!) 175/96  (!) 158/78  Pulse: 85 77 88   Resp:   17   Temp:  97.4 F (36.3 C)    TempSrc:  Oral    SpO2:  100% 97%   Weight:      Height:        General: Pt is alert, follows commands appropriately, not in acute distress Cardiovascular: Regular rate and rhythm, S1/S2 +, no murmurs Respiratory: Coarse sounds but no wheezing  Abdominal: Soft, non tender, non distended, bowel sounds +, no guarding Extremities: no edema, no cyanosis, pulses palpable bilaterally DP and PT Neuro: Grossly nonfocal  Discharge Instructions     Medication List    TAKE these medications   albuterol 108 (90 Base) MCG/ACT inhaler Commonly known as:  PROVENTIL HFA;VENTOLIN HFA Inhale 1-2 puffs into the lungs every 6 (six) hours as needed for wheezing or shortness of breath.   amLODipine 5 MG tablet Commonly  known as:  NORVASC Take 5 mg by mouth daily.   chlorpheniramine-HYDROcodone 10-8 MG/5ML Suer Commonly known as:  TUSSIONEX Take 5 mLs by mouth every 12 (twelve) hours as needed for cough.   furosemide 20 MG tablet Commonly known as:  LASIX Take 20 mg by mouth daily.   predniSONE 5 MG tablet Commonly known as:  DELTASONE Take 10 tablets (50 mg total) by mouth daily with breakfast. Taper down prednisone starting from 50 mg a day, taper down by 5 mg a day down to 0 mg. for ex, today 50 mg, tomorrow 45 mg, then 40 mg the following day  and etc...      Follow-up [place], MD. Schedule an appointment as soon as possible for a visit in 1 week(s).   Specialty:  Family Medicine Contact information: [address] [place] [address] [phone]            The results of significant diagnostics from this hospitalization (including imaging, microbiology, ancillary and laboratory) are listed below for reference.    Significant Diagnostic Studies: Dg Chest 2 View  Result Date: 01/27/2016 CLINICAL DATA:  Shortness of breath and wheezing. Recently prescribed a Z-Pak with no relief. History of hypertension. Nonsmoker. EXAM: CHEST  2 VIEW COMPARISON:  07/05/2015 FINDINGS: The heart size and mediastinal contours are within normal limits. Both lungs are clear. The visualized skeletal structures are unremarkable. IMPRESSION: No active cardiopulmonary disease. Electronically Signed   By: Lucienne Capers M.D.   On: 01/27/2016 19:39   Mm Diag Breast Tomo Uni Left  Result Date: 01/08/2016 CLINICAL DATA:  Recall from screening mammography. EXAM: 2D DIGITAL DIAGNOSTIC UNILATERAL LEFT MAMMOGRAM WITH CAD AND ADJUNCT TOMO COMPARISON:  Previous exam(s). ACR Breast Density Category c: The breast tissue is heterogeneously dense, which may obscure small masses. FINDINGS: Additional views of the left breast with tomosynthesis demonstrate no persistent worrisome finding. The appearance noted on the screening study is consistent with a summation shadow. Mammographic images were processed with CAD. IMPRESSION: No persistent abnormality on additional evaluation the left breast. RECOMMENDATION: Screening mammography in 1 year. I have discussed the findings and recommendations with the patient. Results were also provided in writing at the conclusion of the visit. If applicable, a reminder letter will be sent to the patient regarding the next appointment. BI-RADS CATEGORY  1: Negative. Electronically Signed   By: Altamese Cabal M.D.   On: 01/08/2016 14:13    Microbiology: No results found for this or any previous visit (from the past 240 hour(s)).   Labs: Basic Metabolic Panel:  Recent Labs Lab 01/27/16 1900 01/28/16 0603  NA 132* 134*  K 3.7 3.9  CL 98* 99*  CO2 26 25  GLUCOSE 100* 180*  BUN 6 7  CREATININE 0.72 0.67  CALCIUM 9.2 9.6   Liver Function Tests:  Recent Labs Lab 01/27/16 1900  AST 15  ALT 7*  ALKPHOS 99  BILITOT 0.5  PROT 8.2*  ALBUMIN 4.1   No results for input(s): LIPASE, AMYLASE in the last 168 hours. No results for input(s): AMMONIA in the last 168 hours. CBC:  Recent Labs Lab 01/27/16 1900  WBC 4.8  NEUTROABS 1.7  HGB 13.2  HCT 40.9  MCV 84.9  PLT 250   Cardiac Enzymes:  Recent Labs Lab 01/27/16 1700  TROPONINI <0.03   BNP: BNP (last 3 results)  Recent Labs  01/27/16 1900  BNP 36.9    ProBNP (last 3 results) No results  for input(s): PROBNP in the last 8760 hours.  CBG: No results for input(s): GLUCAP in the last 168 hours.

## 2017-01-01 ENCOUNTER — Encounter (HOSPITAL_COMMUNITY): Payer: Self-pay

## 2017-01-01 ENCOUNTER — Inpatient Hospital Stay (HOSPITAL_COMMUNITY)
Admission: EM | Admit: 2017-01-01 | Discharge: 2017-01-04 | DRG: 193 | Disposition: A | Discharge status: Home / Self Care | Payer: Self-pay | Attending: Internal Medicine | Admitting: Internal Medicine

## 2017-01-01 ENCOUNTER — Emergency Department (HOSPITAL_COMMUNITY): Payer: Self-pay

## 2017-01-01 DIAGNOSIS — K219 Gastro-esophageal reflux disease without esophagitis: Secondary | ICD-10-CM | POA: Diagnosis present

## 2017-01-01 DIAGNOSIS — Z6841 Body Mass Index (BMI) 40.0 and over, adult: Secondary | ICD-10-CM

## 2017-01-01 DIAGNOSIS — Z8249 Family history of ischemic heart disease and other diseases of the circulatory system: Secondary | ICD-10-CM

## 2017-01-01 DIAGNOSIS — F10129 Alcohol abuse with intoxication, unspecified: Secondary | ICD-10-CM

## 2017-01-01 DIAGNOSIS — J4531 Mild persistent asthma with (acute) exacerbation: Secondary | ICD-10-CM | POA: Diagnosis present

## 2017-01-01 DIAGNOSIS — J810 Acute pulmonary edema: Secondary | ICD-10-CM

## 2017-01-01 DIAGNOSIS — M109 Gout, unspecified: Secondary | ICD-10-CM | POA: Diagnosis present

## 2017-01-01 DIAGNOSIS — R946 Abnormal results of thyroid function studies: Secondary | ICD-10-CM | POA: Diagnosis present

## 2017-01-01 DIAGNOSIS — I5031 Acute diastolic (congestive) heart failure: Secondary | ICD-10-CM | POA: Diagnosis present

## 2017-01-01 DIAGNOSIS — R748 Abnormal levels of other serum enzymes: Secondary | ICD-10-CM | POA: Diagnosis present

## 2017-01-01 DIAGNOSIS — J9601 Acute respiratory failure with hypoxia: Secondary | ICD-10-CM | POA: Diagnosis present

## 2017-01-01 DIAGNOSIS — I110 Hypertensive heart disease with heart failure: Secondary | ICD-10-CM | POA: Diagnosis present

## 2017-01-01 DIAGNOSIS — R0602 Shortness of breath: Secondary | ICD-10-CM | POA: Diagnosis present

## 2017-01-01 DIAGNOSIS — Z79899 Other long term (current) drug therapy: Secondary | ICD-10-CM

## 2017-01-01 DIAGNOSIS — F1010 Alcohol abuse, uncomplicated: Secondary | ICD-10-CM | POA: Diagnosis present

## 2017-01-01 DIAGNOSIS — J9690 Respiratory failure, unspecified, unspecified whether with hypoxia or hypercapnia: Secondary | ICD-10-CM | POA: Diagnosis present

## 2017-01-01 DIAGNOSIS — J181 Lobar pneumonia, unspecified organism: Principal | ICD-10-CM | POA: Diagnosis present

## 2017-01-01 LAB — CBC WITH DIFFERENTIAL/PLATELET
Basophils Absolute: 0 10*3/uL (ref 0.0–0.1)
Basophils Relative: 0 %
EOS ABS: 0 10*3/uL (ref 0.0–0.7)
Eosinophils Relative: 0 %
HEMATOCRIT: 34.8 % — AB (ref 36.0–46.0)
HEMOGLOBIN: 11.2 g/dL — AB (ref 12.0–15.0)
LYMPHS ABS: 0.8 10*3/uL (ref 0.7–4.0)
Lymphocytes Relative: 16 %
MCH: 27.5 pg (ref 26.0–34.0)
MCHC: 32.2 g/dL (ref 30.0–36.0)
MCV: 85.5 fL (ref 78.0–100.0)
MONOS PCT: 5 %
Monocytes Absolute: 0.3 10*3/uL (ref 0.1–1.0)
NEUTROS ABS: 3.9 10*3/uL (ref 1.7–7.7)
NEUTROS PCT: 79 %
Platelets: 225 10*3/uL (ref 150–400)
RBC: 4.07 MIL/uL (ref 3.87–5.11)
RDW: 14.3 % (ref 11.5–15.5)
WBC: 5 10*3/uL (ref 4.0–10.5)

## 2017-01-01 LAB — BASIC METABOLIC PANEL
Anion gap: 8 (ref 5–15)
BUN: 8 mg/dL (ref 6–20)
CHLORIDE: 97 mmol/L — AB (ref 101–111)
CO2: 27 mmol/L (ref 22–32)
CREATININE: 0.71 mg/dL (ref 0.44–1.00)
Calcium: 9.1 mg/dL (ref 8.9–10.3)
GFR calc Af Amer: >60 mL/min (ref >60)
GFR calc non Af Amer: >60 mL/min (ref >60)
GLUCOSE: 111 mg/dL — AB (ref 65–99)
Potassium: 4 mmol/L (ref 3.5–5.1)
Sodium: 132 mmol/L — ABNORMAL LOW (ref 135–145)

## 2017-01-01 LAB — RAPID URINE DRUG SCREEN, HOSP PERFORMED
AMPHETAMINES: NOT DETECTED
BENZODIAZEPINES: NOT DETECTED
Barbiturates: NOT DETECTED
Cocaine: POSITIVE — AB
OPIATES: NOT DETECTED
Tetrahydrocannabinol: NOT DETECTED

## 2017-01-01 LAB — BRAIN NATRIURETIC PEPTIDE: B Natriuretic Peptide: 83.2 pg/mL (ref 0.0–100.0)

## 2017-01-01 LAB — I-STAT TROPONIN, ED: Troponin i, poc: 0.06 ng/mL (ref 0.00–0.08)

## 2017-01-01 MED ORDER — DEXTROSE 5 % IV SOLN
500.0000 mg | INTRAVENOUS | Status: DC
  Administered 2017-01-02: 500 mg via INTRAVENOUS
  Filled 2017-01-01 (×2): qty 500

## 2017-01-01 MED ORDER — FUROSEMIDE 10 MG/ML IJ SOLN
20.0000 mg | Freq: Once | INTRAMUSCULAR | Status: AC
  Administered 2017-01-01: 20 mg via INTRAVENOUS
  Filled 2017-01-01: qty 4

## 2017-01-01 MED ORDER — DEXTROSE 5 % IV SOLN
1.0000 g | Freq: Once | INTRAVENOUS | Status: AC
  Administered 2017-01-01: 1 g via INTRAVENOUS
  Filled 2017-01-01: qty 10

## 2017-01-01 MED ORDER — IPRATROPIUM BROMIDE 0.02 % IN SOLN
0.5000 mg | Freq: Once | RESPIRATORY_TRACT | Status: AC
  Administered 2017-01-01: 0.5 mg via RESPIRATORY_TRACT
  Filled 2017-01-01: qty 2.5

## 2017-01-01 MED ORDER — ALBUTEROL SULFATE (2.5 MG/3ML) 0.083% IN NEBU
2.5000 mg | INHALATION_SOLUTION | RESPIRATORY_TRACT | Status: DC | PRN
  Filled 2017-01-01: qty 3

## 2017-01-01 MED ORDER — DEXTROSE 5 % IV SOLN
500.0000 mg | Freq: Once | INTRAVENOUS | Status: AC
  Administered 2017-01-01: 500 mg via INTRAVENOUS
  Filled 2017-01-01: qty 500

## 2017-01-01 MED ORDER — THIAMINE HCL 100 MG/ML IJ SOLN
100.0000 mg | Freq: Every day | INTRAMUSCULAR | Status: DC
  Administered 2017-01-01: 100 mg via INTRAVENOUS
  Filled 2017-01-01: qty 2

## 2017-01-01 MED ORDER — AMLODIPINE BESYLATE 5 MG PO TABS
5.0000 mg | ORAL_TABLET | Freq: Every day | ORAL | Status: DC
  Administered 2017-01-02 – 2017-01-04 (×3): 5 mg via ORAL
  Filled 2017-01-01 (×3): qty 1

## 2017-01-01 MED ORDER — FUROSEMIDE 10 MG/ML IJ SOLN
40.0000 mg | Freq: Two times a day (BID) | INTRAMUSCULAR | Status: AC
  Administered 2017-01-02 – 2017-01-03 (×4): 40 mg via INTRAVENOUS
  Filled 2017-01-01 (×4): qty 4

## 2017-01-01 MED ORDER — DEXTROSE 5 % IV SOLN: 1.0000 g | INTRAVENOUS | Status: DC

## 2017-01-01 MED ORDER — LORAZEPAM 1 MG PO TABS: 1.0000 mg | ORAL_TABLET | Freq: Four times a day (QID) | ORAL | Status: DC | PRN

## 2017-01-01 MED ORDER — ALBUTEROL (5 MG/ML) CONTINUOUS INHALATION SOLN
10.0000 mg/h | INHALATION_SOLUTION | Freq: Once | RESPIRATORY_TRACT | Status: AC
  Administered 2017-01-01: 10 mg/h via RESPIRATORY_TRACT
  Filled 2017-01-01: qty 20

## 2017-01-01 MED ORDER — ADULT MULTIVITAMIN W/MINERALS CH
1.0000 | ORAL_TABLET | Freq: Every day | ORAL | Status: DC
  Administered 2017-01-01 – 2017-01-04 (×4): 1 via ORAL
  Filled 2017-01-01 (×5): qty 1

## 2017-01-01 MED ORDER — LORAZEPAM 2 MG/ML IJ SOLN: 1.0000 mg | Freq: Four times a day (QID) | INTRAMUSCULAR | Status: DC | PRN

## 2017-01-01 MED ORDER — IPRATROPIUM-ALBUTEROL 0.5-2.5 (3) MG/3ML IN SOLN
3.0000 mL | Freq: Four times a day (QID) | RESPIRATORY_TRACT | Status: DC
  Administered 2017-01-01 – 2017-01-03 (×7): 3 mL via RESPIRATORY_TRACT
  Filled 2017-01-01 (×7): qty 3

## 2017-01-01 MED ORDER — FOLIC ACID 1 MG PO TABS
1.0000 mg | ORAL_TABLET | Freq: Every day | ORAL | Status: DC
  Administered 2017-01-01 – 2017-01-04 (×4): 1 mg via ORAL
  Filled 2017-01-01 (×5): qty 1

## 2017-01-01 MED ORDER — VITAMIN B-1 100 MG PO TABS
100.0000 mg | ORAL_TABLET | Freq: Every day | ORAL | Status: DC
  Administered 2017-01-02 – 2017-01-04 (×3): 100 mg via ORAL
  Filled 2017-01-01 (×3): qty 1

## 2017-01-01 MED ORDER — HEPARIN SODIUM (PORCINE) 5000 UNIT/ML IJ SOLN
5000.0000 [IU] | Freq: Three times a day (TID) | INTRAMUSCULAR | Status: DC
  Administered 2017-01-01 – 2017-01-04 (×6): 5000 [IU] via SUBCUTANEOUS
  Filled 2017-01-01 (×6): qty 1

## 2017-01-01 MED ORDER — VITAMIN B-1 100 MG PO TABS: 100.0000 mg | ORAL_TABLET | Freq: Every day | ORAL | Status: DC

## 2017-01-01 MED ORDER — METHYLPREDNISOLONE SODIUM SUCC 40 MG IJ SOLR
40.0000 mg | Freq: Three times a day (TID) | INTRAMUSCULAR | Status: DC
  Administered 2017-01-01 – 2017-01-03 (×5): 40 mg via INTRAVENOUS
  Filled 2017-01-01 (×5): qty 1

## 2017-01-01 NOTE — ED Notes (Signed)
Attempted to call report. Was told that the staff was in report and would call back.

## 2017-01-01 NOTE — H&P (Signed)
History and Physical    Caroline Harvey XFG:[id number] DOB: 08-02-1960 DOA: 01/01/2017  PCP: Aretta Nip, MD Patient coming from: Home  Chief Complaint: Shortness of breath  HPI: Caroline Harvey is a 56 y.o. female with medical history significant of asthma and hypertension came to the ER for evaluation of shortness of breath. Patient is drowsy at this time but appropriately responding to all the questions and her neuro exam is nonfocal. Patient states she has been feeling short of breath for past few days especially with exertion and today she tried using her friend's inhaler as she thinks she was having asthma exacerbation. Despite of using the inhaler several times her respiratory status did not improve therefore ended up calling EMS. In the EMS she received a dose of Solu-Medrol and magnesium with minimal improvement in her breathing. She has been having mild orthopnea and may be low-grade temperatures at home. She is also noticed some lower extremity swelling. No previous cardiac history. Admits of heavily drinking hard liquor at home with her last drink being over 24 hours ago but she does not want to exactly state how much enough what she drinks. She denies any illicit drug use or tobacco use. Denies any recent sick contacts.  In the ER patient was noted to have significant coarse breath sounds with some wheezing. She was started on Lasix 20 mg IV, nebulizer and was given IV steriods (in EMS). Her labs were generally unremarkable and her chest x-ray showed cardiomegaly with signs of pulmonary edema and possible infiltrate suggestive of early pneumonia. She was also given IV ceftriaxone and azithromycin. On her vital signs she was afebrile but was slightly hypoxic with oxygen saturation 90-92% on room air.   Review of Systems: As per HPI otherwise 10 point review of systems negative.   Past Medical History:  Diagnosis Date  . Alcohol abuse   . Anemia   . Hypertension   .  Hyponatremia 09/2014    Past Surgical History:  Procedure Laterality Date  . ANKLE SURGERY Right    x  2     reports that she has never smoked. She has never used smokeless tobacco. She reports that she drinks alcohol. She reports that she does not use drugs.  No Known Allergies  Family History  Problem Relation Age of Onset  . Hypertension Mother   . Breast cancer Paternal Aunt   . Breast cancer Paternal Aunt     Acceptable: Family history reviewed and not pertinent (If you reviewed it)  Prior to Admission medications   Medication Sig Start Date End Date Taking? Authorizing Provider  albuterol (PROVENTIL HFA;VENTOLIN HFA) 108 (90 Base) MCG/ACT inhaler Inhale 1-2 puffs into the lungs every 6 (six) hours as needed for wheezing or shortness of breath. 01/29/16  Yes Robbie Lis, MD  amLODipine (NORVASC) 5 MG tablet Take 5 mg by mouth daily.    [provider]  chlorpheniramine-HYDROcodone (TUSSIONEX) 10-8 MG/5ML SUER Take 5 mLs by mouth every 12 (twelve) hours as needed for cough. 01/29/16   Robbie Lis, MD  furosemide (LASIX) 20 MG tablet Take 20 mg by mouth daily.    [provider]  predniSONE (DELTASONE) 5 MG tablet Take 10 tablets (50 mg total) by mouth daily with breakfast. Taper down prednisone starting from 50 mg a day, taper down by 5 mg a day down to 0 mg. for ex, today 50 mg, tomorrow 45 mg, then 40 mg the following day and etc... 01/29/16  Robbie Lis, MD    Physical Exam: Vitals:   01/01/17 1700 01/01/17 1709 01/01/17 1733 01/01/17 1800  BP: 125/63  (!) 157/74 (!) 152/102  Pulse: 74  76 84  Resp: 13  15 19   Temp:      SpO2: (!) 87%  94% 98%  Weight:  131.1 kg (289 lb)    Height:  5\' 7"  (1.702 m)        Constitutional: NAD, calm, comfortable; drowsy Vitals:   01/01/17 1700 01/01/17 1709 01/01/17 1733 01/01/17 1800  BP: 125/63  (!) 157/74 (!) 152/102  Pulse: 74  76 84  Resp: 13  15 19   Temp:      SpO2: (!) 87%  94% 98%  Weight:  131.1  kg (289 lb)    Height:  5\' 7"  (1.702 m)     Eyes: PERRL, lids and conjunctivae normal ENMT: Mucous membranes are moist. Posterior pharynx clear of any exudate or lesions.Normal dentition.  Neck: normal, supple, no masses, no thyromegaly Respiratory: Diffuse bibasilar rhonchorous breath sounds with some expiratory wheezing. Cardiovascular: Regular rate and rhythm, no murmurs / rubs / gallops. No extremity edema. 2+ pedal pulses. No carotid bruits.  Abdomen: no tenderness, no masses palpated. No hepatosplenomegaly. Bowel sounds positive.  Musculoskeletal: no clubbing / cyanosis. No joint deformity upper and lower extremities. Good ROM, no contractures. Normal muscle tone.  Skin: no rashes, lesions, ulcers. No induration Neurologic: CN 2-12 grossly intact. Sensation intact, DTR normal. Strength 5/5 in all 4. No focal neuro deficits Psychiatric: Normal judgment and insight. Alert and oriented x 3. Normal mood.     Labs on Admission: I have personally reviewed following labs and imaging studies  CBC:  Recent Labs Lab 01/01/17 1450  WBC 5.0  NEUTROABS 3.9  HGB 11.2*  HCT 34.8*  MCV 85.5  PLT 735   Basic Metabolic Panel:  Recent Labs Lab 01/01/17 1450  NA 132*  K 4.0  CL 97*  CO2 27  GLUCOSE 111*  BUN 8  CREATININE 0.71  CALCIUM 9.1   GFR: Estimated Creatinine Clearance: 113.5 mL/min (by C-G formula based on SCr of 0.71 mg/dL). Liver Function Tests: No results for input(s): AST, ALT, ALKPHOS, BILITOT, PROT, ALBUMIN in the last 168 hours. No results for input(s): LIPASE, AMYLASE in the last 168 hours. No results for input(s): AMMONIA in the last 168 hours. Coagulation Profile: No results for input(s): INR, PROTIME in the last 168 hours. Cardiac Enzymes: No results for input(s): CKTOTAL, CKMB, CKMBINDEX, TROPONINI in the last 168 hours. BNP (last 3 results) No results for input(s): PROBNP in the last 8760 hours. HbA1C: No results for input(s): HGBA1C in the last 72  hours. CBG: No results for input(s): GLUCAP in the last 168 hours. Lipid Profile: No results for input(s): CHOL, HDL, LDLCALC, TRIG, CHOLHDL, LDLDIRECT in the last 72 hours. Thyroid Function Tests: No results for input(s): TSH, T4TOTAL, FREET4, T3FREE, THYROIDAB in the last 72 hours. Anemia Panel: No results for input(s): VITAMINB12, FOLATE, FERRITIN, TIBC, IRON, RETICCTPCT in the last 72 hours. Urine analysis:    Component Value Date/Time   COLORURINE YELLOW 10/03/2014 1623   APPEARANCEUR CLEAR 10/03/2014 1623   LABSPEC 1.008 10/03/2014 1623   PHURINE 5.0 10/03/2014 1623   GLUCOSEU NEGATIVE 10/03/2014 1623   HGBUR NEGATIVE 10/03/2014 1623   HGBUR trace-intact 12/18/2009 0908   BILIRUBINUR NEGATIVE 10/03/2014 1623   KETONESUR NEGATIVE 10/03/2014 1623   PROTEINUR NEGATIVE 10/03/2014 1623   UROBILINOGEN 0.2 10/03/2014 1623   NITRITE  NEGATIVE 10/03/2014 1623   LEUKOCYTESUR NEGATIVE 10/03/2014 1623   Sepsis Labs: !!!!!!!!!!!!!!!!!!!!!!!!!!!!!!!!!!!!!!!!!!!! @LABRCNTIP (procalcitonin:4,lacticidven:4) )No results found for this or any previous visit (from the past 240 hour(s)).   Radiological Exams on Admission: Dg Chest 2 View  Result Date: 01/01/2017 CLINICAL DATA:  Per EMS- Patient from home and states she has had SOB, wheezing, and tremors all day. Patient also reports that she has used her inhaler with no relief. EXAM: CHEST  2 VIEW COMPARISON:  01/27/2016 FINDINGS: The heart is mildly enlarged. There is perihilar opacity consistent with mild edema. More focal opacity in the left lung base may be related atelectasis, infiltrate, or confluent edema. Shallow lung inflation. IMPRESSION: Cardiomegaly and changes of pulmonary edema. Possible left lower lobe infiltrate or atelectasis appear Electronically Signed   By: Nolon Nations M.D.   On: 01/01/2017 17:36    EKG: Independently reviewed. Some nonspecific ST-T changes and some flattening of lateral T waves not much significantly  changed from previous EKGs.  Assessment/Plan Active Problems:   SOB (shortness of breath)   Acute Moderate shortness of breath with mild hypoxia, 90% on room air -Multifactorial in nature-community-acquired pneumonia and/or fluid overload and/or Asthma Exacerbation  -Each issues addressed below. Admit for further care  Community-acquired pneumonia -Chest x-ray shows signs of infiltrates suggestive of questionable pneumonia -Patient started on ceftriaxone and azithroymcin. We'll continue -Supplemental oxygen as needed  Mild acute exacerbation of mild persistent asthma -Refractory to home treatment with inhaler -Wheezing could  Also be related to fluid overload -Solu-Medrol IV 40 mg every 8 hours, scheduled and as needed DuoNeb and albuterol -Supplemental oxygen. Supportive care -Check UDS  Fluid overload with pulmonary edema -No previous echocardiogram available, no official diagnosis of congestive heart failure -20 mg of IV Lasix given in the ER area I'll order for 20 more IV Lasix right now and continue 40 mg IV twice daily for next 4 doses starting tomorrow. -Monitor urine output. Strict input and output. Trend cardiac enzymes first set is 0.06. EKG as mentioned above. -Echocardiogram ordered for tomorrow morning.  Alcohol abuse At risk of alcohol withdrawal -We'll place the patient on CIWA protocol -oral vitamins, folate and thiamine started. Unable to give banana bag due to signs of fluid overload.  History of hypertension -She os supposed to be on Norvasc 5 mg daily per history but has not been taking it.    DVT prophylaxis: Heparin subcutaneous Code Status: Full code Family Communication: None at bedside Disposition Plan: To be determined Consults called: None Admission status: Observation   Ankit Arsenio Loader MD [facility]   If 7PM-7AM, please contact night-coverage [email or web address] Password [facility]  01/01/2017, 6:15 PM

## 2017-01-01 NOTE — ED Notes (Signed)
BLOOD DRAW DELAYED, PT BEING TRANSFERRED UPSTAIRS.

## 2017-01-01 NOTE — ED Provider Notes (Signed)
[facility] DEPT Provider Note   CSN: [id number] Arrival date & time: 01/01/17  1401     History   Chief Complaint Chief Complaint  Patient presents with  . Shortness of Breath    HPI Caroline Harvey is a 56 y.o. female.   Shortness of Breath  Associated symptoms include cough. Pertinent negatives include no fever, no chest pain and no abdominal pain.    56 year old female who presents with shortness of breath. History of HTN and asthma. Has been feeling sob for "a while" worsening over past few days. No inhalers that she owns now, but has been using other people's inhaler without improvement. Has had cough, congestion, runny nose, clear sputum. Mild ankle edema reported but no calf tenderness. Denies chest pain, fever, abdominal pain, n/v. EMS called today. She received duoneb, 2 mg magnesium sulfate, and 125 mg solumedrol.   Past Medical History:  Diagnosis Date  . Alcohol abuse   . Anemia   . Hypertension   . Hyponatremia 09/2014    Patient Active Problem List   Diagnosis Date Noted  . Asthma exacerbation 01/28/2016  . Bronchiolitis 01/27/2016  . Hyponatremia 10/03/2014  . Nausea vomiting and diarrhea 10/03/2014  . Leukopenia 10/03/2014  . Acute kidney injury ([place]) 10/03/2014  . 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 FRACTURE, ANKLE, RIGHT 07/05/2009  . GERD 02/07/2009  . BACK PAIN, LUMBAR 02/24/2008  . GOUT, UNSPECIFIED 11/25/2007  . Alcohol abuse 11/25/2007  . SUPERFICIAL THROMBOPHLEBITIS 03/18/2007  . METATARSALGIA 03/18/2007  . VARICOSE VEIN 02/18/2007  . Pain in limb 02/18/2007  . B12 DEFICIENCY 01/12/2007  . OBESITY NOS 01/12/2007  . HYPERTENSION, BENIGN 01/12/2007  . EDEMA LEG 01/12/2007    Past Surgical History:  Procedure Laterality Date  . ANKLE SURGERY Right    x  2    OB History    Gravida Para Term Preterm AB Living   1 1 1     1    SAB TAB Ectopic Multiple Live Births                   Home Medications    Prior to Admission medications     Medication Sig Start Date End Date Taking? Authorizing Provider  albuterol (PROVENTIL HFA;VENTOLIN HFA) 108 (90 Base) MCG/ACT inhaler Inhale 1-2 puffs into the lungs every 6 (six) hours as needed for wheezing or shortness of breath. 01/29/16  Yes Robbie Lis, MD  amLODipine (NORVASC) 5 MG tablet Take 5 mg by mouth daily.    [provider]  chlorpheniramine-HYDROcodone (TUSSIONEX) 10-8 MG/5ML SUER Take 5 mLs by mouth every 12 (twelve) hours as needed for cough. 01/29/16   Robbie Lis, MD  furosemide (LASIX) 20 MG tablet Take 20 mg by mouth daily.    [provider]  predniSONE (DELTASONE) 5 MG tablet Take 10 tablets (50 mg total) by mouth daily with breakfast. Taper down prednisone starting from 50 mg a day, taper down by 5 mg a day down to 0 mg. for ex, today 50 mg, tomorrow 45 mg, then 40 mg the following day and etc... 01/29/16   Robbie Lis, MD    Family History Family History  Problem Relation Age of Onset  . Hypertension Mother   . Breast cancer Paternal Aunt   . Breast cancer Paternal Aunt     Social History Social History  Substance Use Topics  . Smoking status: Never Smoker  . Smokeless tobacco: Never Used  . Alcohol use Yes  Comment: Quit May 20 2015     Allergies   Patient has no known allergies.   Review of Systems Review of Systems  Constitutional: Negative for fever.  HENT: Positive for congestion.   Respiratory: Positive for cough and shortness of breath.   Cardiovascular: Negative for chest pain.  Gastrointestinal: Negative for abdominal pain.  Allergic/Immunologic: Negative for immunocompromised state.  Hematological: Does not bruise/bleed easily.  All other systems reviewed and are negative.    Physical Exam Updated Vital Signs BP (!) 157/74 (BP Location: Left Arm)   Pulse 76   Temp 98.2 F (36.8 C)   Resp 15   Ht 5\' 7"  (1.702 m)   Wt 131.1 kg (289 lb)   LMP 12/14/2010   SpO2 94%   BMI 45.26 kg/m   Physical  Exam Physical Exam  Nursing note and vitals reviewed. Constitutional: Well developed, well nourished, non-toxic, and in no acute distress Head: Normocephalic and atraumatic.  Mouth/Throat: Oropharynx is clear and moist.  Neck: Normal range of motion. Neck supple.  Cardiovascular: Normal rate and regular rhythm.   Pulmonary/Chest: Effort mildly increased. No accessory muscle usage. Breath sounds with expiratory wheezing diffusely.  Abdominal: Soft. There is no tenderness. There is no rebound and no guarding.  Musculoskeletal: Normal range of motion.  Neurological: sleepy but arouses to voice and answers questions appropriately, no facial droop, fluent speech, moves all extremities symmetrically Skin: Skin is warm and dry.  Psychiatric: Cooperative   ED Treatments / Results  Labs (all labs ordered are listed, but only abnormal results are displayed) Labs Reviewed  CBC WITH DIFFERENTIAL/PLATELET - Abnormal; Notable for the following:       Result Value   Hemoglobin 11.2 (*)    HCT 34.8 (*)    All other components within normal limits  BASIC METABOLIC PANEL - Abnormal; Notable for the following:    Sodium 132 (*)    Chloride 97 (*)    Glucose, Bld 111 (*)    All other components within normal limits  BRAIN NATRIURETIC PEPTIDE  I-STAT TROPONIN, ED  I-STAT VENOUS BLOOD GAS, ED    EKG  EKG Interpretation  Date/Time:  Wednesday January 01 2017 14:14:54 EDT Ventricular Rate:  79 PR Interval:    QRS Duration: 104 QT Interval:  396 QTC Calculation: 454 R Axis:   18 Text Interpretation:  Sinus rhythm Nonspecific T abnormalities, lateral leads Minimal ST elevation, anterior leads no acute changes  Confirmed by Brantley Stage [id number]) on 01/01/2017 5:09:19 PM       Radiology Dg Chest 2 View  Result Date: 01/01/2017 CLINICAL DATA:  Per EMS- Patient from home and states she has had SOB, wheezing, and tremors all day. Patient also reports that she has used her inhaler with no relief. EXAM:  CHEST  2 VIEW COMPARISON:  01/27/2016 FINDINGS: The heart is mildly enlarged. There is perihilar opacity consistent with mild edema. More focal opacity in the left lung base may be related atelectasis, infiltrate, or confluent edema. Shallow lung inflation. IMPRESSION: Cardiomegaly and changes of pulmonary edema. Possible left lower lobe infiltrate or atelectasis appear Electronically Signed   By: Nolon Nations M.D.   On: 01/01/2017 17:36    Procedures Procedures (including critical care time) CRITICAL CARE Performed by: Forde Dandy   Total critical care time: 35 minutes  Critical care time was exclusive of separately billable procedures and treating other patients.  Critical care was necessary to treat or prevent imminent or life-threatening deterioration.  Critical care  was time spent personally by me on the following activities: development of treatment plan with patient and/or surrogate as well as nursing, discussions with consultants, evaluation of patient's response to treatment, examination of patient, obtaining history from patient or surrogate, ordering and performing treatments and interventions, ordering and review of laboratory studies, ordering and review of radiographic studies, pulse oximetry and re-evaluation of patient's condition.  Medications Ordered in ED Medications  cefTRIAXone (ROCEPHIN) 1 g in dextrose 5 % 50 mL IVPB (not administered)  azithromycin (ZITHROMAX) 500 mg in dextrose 5 % 250 mL IVPB (not administered)  furosemide (LASIX) injection 20 mg (not administered)  ipratropium (ATROVENT) nebulizer solution 0.5 mg (0.5 mg Nebulization Given 01/01/17 1505)  albuterol (PROVENTIL,VENTOLIN) solution continuous neb (10 mg/hr Nebulization Given 01/01/17 1505)     Initial Impression / Assessment and Plan / ED Course  I have reviewed the triage vital signs and the nursing notes.  Pertinent labs & imaging results that were available during my care of the patient were  reviewed by me and considered in my medical decision making (see chart for details).     56 year old female who presents for shortness of breath. Borderline hypoxic with pulse ox 90-92% on room air, and is placed on supplemental oxygen. Lungs with some rhonchi as well as diffuse expiratory wheezing. 100 he received steroids, magnesium sulfate and breathing treatment by EMS. Placed on continuous albuterol for presumed asthma exacerbation. However chest x-ray visualized and also shows possible pulmonary edema versus infiltrate. She has no fever, or leukocytosis. I did empirically start ceftriaxone and azithromycin. A BNP is normal and no previous history of heart failure, but also given 20 mg of IV Lasix. Her EKG is nonischemic and troponin is normal. She is discussed with hospitalist service and admitted for ongoing treatment and workup.  Final Clinical Impressions(s) / ED Diagnoses   Final diagnoses:  Mild persistent asthma with exacerbation  Lobar pneumonia ([place])  Acute pulmonary edema ([place])    New Prescriptions New Prescriptions   No medications on file     Forde Dandy, MD 01/01/17 1800

## 2017-01-01 NOTE — ED Triage Notes (Signed)
Per EMS- Patient from home and states she has had SOB, wheezing, and tremors all day. Patient also reports that she has used her inhaler with no relief. EMS gave a duo neb, 2 grams Mag sulphate and 125 mg Solumedrol prior to arrival to  The ED. Patient has swelling to boath ankles.

## 2017-01-02 ENCOUNTER — Observation Stay (HOSPITAL_COMMUNITY): Payer: Self-pay

## 2017-01-02 DIAGNOSIS — J181 Lobar pneumonia, unspecified organism: Principal | ICD-10-CM

## 2017-01-02 DIAGNOSIS — J4531 Mild persistent asthma with (acute) exacerbation: Secondary | ICD-10-CM

## 2017-01-02 DIAGNOSIS — R0602 Shortness of breath: Secondary | ICD-10-CM

## 2017-01-02 DIAGNOSIS — J9690 Respiratory failure, unspecified, unspecified whether with hypoxia or hypercapnia: Secondary | ICD-10-CM | POA: Diagnosis present

## 2017-01-02 LAB — BLOOD GAS, VENOUS
ACID-BASE DEFICIT: 2.5 mmol/L — AB (ref 0.0–2.0)
BICARBONATE: 27.8 mmol/L (ref 20.0–28.0)
O2 CONTENT: 2 L/min
O2 Saturation: 78.9 %
PH VEN: 7.332 (ref 7.250–7.430)
PO2 VEN: 46.9 mmHg — AB (ref 32.0–45.0)
Patient temperature: 98.6
pCO2, Ven: 54 mmHg (ref 44.0–60.0)

## 2017-01-02 LAB — COMPREHENSIVE METABOLIC PANEL
ALT: 10 U/L — ABNORMAL LOW (ref 14–54)
AST: 17 U/L (ref 15–41)
Albumin: 3.9 g/dL (ref 3.5–5.0)
Alkaline Phosphatase: 76 U/L (ref 38–126)
Anion gap: 11 (ref 5–15)
BUN: 13 mg/dL (ref 6–20)
CO2: 32 mmol/L (ref 22–32)
Calcium: 9.1 mg/dL (ref 8.9–10.3)
Chloride: 93 mmol/L — ABNORMAL LOW (ref 101–111)
Creatinine, Ser: 0.81 mg/dL (ref 0.44–1.00)
GFR calc Af Amer: >60 mL/min (ref >60)
GFR calc non Af Amer: >60 mL/min (ref >60)
Glucose, Bld: 121 mg/dL — ABNORMAL HIGH (ref 65–99)
Potassium: 4.1 mmol/L (ref 3.5–5.1)
Sodium: 136 mmol/L (ref 135–145)
Total Bilirubin: 0.3 mg/dL (ref 0.3–1.2)
Total Protein: 7.7 g/dL (ref 6.5–8.1)

## 2017-01-02 LAB — TROPONIN I
Troponin I: 0.03 ng/mL (ref <0.03)
Troponin I: 0.03 ng/mL (ref <0.03)

## 2017-01-02 LAB — CBC
HCT: 45.8 % (ref 36.0–46.0)
HEMOGLOBIN: 15.3 g/dL — AB (ref 12.0–15.0)
MCH: 28.3 pg (ref 26.0–34.0)
MCHC: 33.4 g/dL (ref 30.0–36.0)
MCV: 84.7 fL (ref 78.0–100.0)
PLATELETS: 278 10*3/uL (ref 150–400)
RBC: 5.41 MIL/uL — ABNORMAL HIGH (ref 3.87–5.11)
RDW: 14.7 % (ref 11.5–15.5)
WBC: 6.4 10*3/uL (ref 4.0–10.5)

## 2017-01-02 LAB — HIV ANTIBODY (ROUTINE TESTING W REFLEX): HIV SCREEN 4TH GENERATION: NONREACTIVE

## 2017-01-02 LAB — ECHOCARDIOGRAM COMPLETE
HEIGHTINCHES: 67 in
Weight: 4634.95 oz

## 2017-01-02 LAB — GLUCOSE, CAPILLARY: GLUCOSE-CAPILLARY: 100 mg/dL — AB (ref 65–99)

## 2017-01-02 LAB — AMMONIA: AMMONIA: 48 umol/L — AB (ref 9–35)

## 2017-01-02 MED ORDER — DEXTROSE 5 % IV SOLN
2.0000 g | INTRAVENOUS | Status: DC
  Administered 2017-01-02 – 2017-01-03 (×2): 2 g via INTRAVENOUS
  Filled 2017-01-02 (×3): qty 2

## 2017-01-02 MED ORDER — GUAIFENESIN ER 600 MG PO TB12
1200.0000 mg | ORAL_TABLET | Freq: Two times a day (BID) | ORAL | Status: DC
  Administered 2017-01-02 – 2017-01-04 (×5): 1200 mg via ORAL
  Filled 2017-01-02 (×5): qty 2

## 2017-01-02 MED ORDER — ASPIRIN 325 MG PO TABS
325.0000 mg | ORAL_TABLET | Freq: Every day | ORAL | Status: DC
  Administered 2017-01-02 – 2017-01-04 (×3): 325 mg via ORAL
  Filled 2017-01-02 (×4): qty 1

## 2017-01-02 NOTE — Progress Notes (Signed)
  Echocardiogram 2D Echocardiogram has been performed.  Caroline Harvey 01/02/2017, 1:26 PM

## 2017-01-02 NOTE — Progress Notes (Signed)
Lab stated they have attempted multiple times to draw patient labs this am and was unable. MD made aware.

## 2017-01-02 NOTE — Progress Notes (Signed)
This shift pt arrived to unit room 1511 via stretcher. Lethargic but easy to arouse. Pronounced tremors. Delayed responses r/t ETOH/ substance use..VS taken, callbell and pt guide at the bedside. 0/10 pain. Initial assessment completed. Will continue to monitor and intervene appropriately.

## 2017-01-02 NOTE — Progress Notes (Signed)
Pt assessment reveals pt is more confused, unable to state the year of her birth or tell ne her age , constantly repeating 11/17. Does not know address, urinary incontinence, hands me her rt arm when asking for left arm with ID bracelet, cant read signs on the wall at the foot of her bed. Pts brother Alvester Chou ( hosp employee) arrived to visit - I discussed with him the changes I noted and states that is not her baseline. He is aware of her cocaine use and states she has also had problems with pills too. Pt did recognize her brother but was unable to answer any other orientation questions. Pt is drowsy and slow to respond to all request. Notified the on call provider - orders obtained, charge nurse aware.

## 2017-01-02 NOTE — Care Management Note (Signed)
Case Management Note  Patient Details  Name: Fionna Merriott MRN: [id number] Date of Birth: Jul 13, 1960  Subjective/Objective:       Asthma exacerbation             Action/Plan: Date:  January 02, 2017 Chart reviewed for concurrent status and case management needs. Will continue to follow patient progress. Discharge Planning: following for needs Expected discharge date: [id number] Velva Harman, BSN, [place], [place]  Expected Discharge Date:   (unknown)               Expected Discharge Plan:  Home/Self Care  In-House Referral:     Discharge planning Services  CM Consult  Post Acute Care Choice:    Choice offered to:     DME Arranged:    DME Agency:     HH Arranged:    [facility] Agency:     Status of Service:  In process, will continue to follow  If discussed at Long Length of Stay Meetings, dates discussed:    Additional Comments:  Leeroy Cha, RN 01/02/2017, 8:49 AM

## 2017-01-02 NOTE — Progress Notes (Signed)
PROGRESS NOTE                                                                                                                                                                                                             Patient Demographics:    Caroline Harvey, is a 56 y.o. female, DOB - 1960-08-19, CZY:[id number]  Admit date - 01/01/2017   Admitting Physician Ankit Arsenio Loader, MD  Outpatient Primary MD for the patient is Rankins, Bill Salinas, MD  LOS - 0    Chief Complaint  Patient presents with  . Shortness of Breath       Brief Narrative   56 y.o. female with medical history significant of asthma and hypertension came to the ER for evaluation of shortness of breath, workup significant for pneumonia and mild volume overload, urine drug screen positive for cocaine.   Subjective:    Caroline Harvey today For some dyspnea, productive cough, denies any chest pain, nausea or vomiting   Assessment  & Plan :    Active Problems:   SOB (shortness of breath)  Acute hypoxic respiratory failure -  87% on room air, multifactorial secondary to community-acquired pneumonia and CHF. Wean oxygen as tolerated  Community-acquired pneumonia -Chest x-ray shows signs of infiltrates suggestive of questionable pneumonia, continue with IV Rocephin and azithromycin, encouraged to use incentive spirometry and flutter valve, will start on Mucinex, having cough with productive sputum.  Mild acute exacerbation of mild persistent asthma - Less wheezing today, will taper her Solu-Medrol to every 12 hours.  Urine drug screen positive for cocaine   Fluid overload with pulmonary edema - Improving with IV diuresis, monitor ins and outs, follow on 2-D, troponin 0.06, drug screen. positive for cocaine -No previous echocardiogram available, no official diagnosis of congestive heart failure  Alcohol abuse At risk of alcohol withdrawal - on CIWA protocol -oral vitamins,  folate and thiamine started. Unable to give banana bag due to signs of fluid overload.  History of hypertension -She os supposed to be on Norvasc 5 mg daily per history but has not been taking it.    Code Status : Full   Family Communication  : none at bedside  Disposition Plan  : home when stable  Consults  :  None  Procedures  : None  DVT Prophylaxis  :  Lovenox  Lab Results  Component Value Date   PLT 225 01/01/2017    Antibiotics  :    Anti-infectives    Start     Dose/Rate Route Frequency Ordered Stop   01/02/17 1800  azithromycin (ZITHROMAX) 500 mg in dextrose 5 % 250 mL IVPB     500 mg 250 mL/hr over 60 Minutes Intravenous Every 24 hours 01/01/17 1814     01/02/17 1800  cefTRIAXone (ROCEPHIN) 1 g in dextrose 5 % 50 mL IVPB  Status:  Discontinued     1 g 100 mL/hr over 30 Minutes Intravenous Every 24 hours 01/01/17 1814 01/02/17 1227   01/02/17 1800  cefTRIAXone (ROCEPHIN) 2 g in dextrose 5 % 50 mL IVPB     2 g 100 mL/hr over 30 Minutes Intravenous Every 24 hours 01/02/17 1227     01/01/17 1745  cefTRIAXone (ROCEPHIN) 1 g in dextrose 5 % 50 mL IVPB     1 g 100 mL/hr over 30 Minutes Intravenous  Once 01/01/17 1743 01/01/17 1857   01/01/17 1745  azithromycin (ZITHROMAX) 500 mg in dextrose 5 % 250 mL IVPB     500 mg 250 mL/hr over 60 Minutes Intravenous  Once 01/01/17 1743 01/01/17 1958        Objective:   Vitals:   01/02/17 0113 01/02/17 0604 01/02/17 0746 01/02/17 0901  BP:  109/63  139/67  Pulse:  (!) 54    Resp:  15    Temp:      TempSrc:      SpO2: 99% 97% 100%   Weight:  131.4 kg (289 lb 11 oz)    Height:        Wt Readings from Last 3 Encounters:  01/02/17 131.4 kg (289 lb 11 oz)  01/27/16 131.1 kg (289 lb)  12/28/15 131.1 kg (289 lb)     Intake/Output Summary (Last 24 hours) at 01/02/17 1342 Last data filed at 01/02/17 1015  Gross per 24 hour  Intake               50 ml  Output             1950 ml  Net            -1900 ml      Physical Exam  Awake Alert, Oriented X 3,  Symmetrical Chest wall movement, decreased air movement bilaterally, scattered wheezing RRR,No Gallops,Rubs or new Murmurs, No Parasternal Heave +ve B.Sounds, Abd Soft, No tenderness,, No rebound - guarding or rigidity. No Cyanosis, Clubbing or edema, No new Rash or bruise      Data Review:    CBC  Recent Labs Lab 01/01/17 1450  WBC 5.0  HGB 11.2*  HCT 34.8*  PLT 225  MCV 85.5  MCH 27.5  MCHC 32.2  RDW 14.3  LYMPHSABS 0.8  MONOABS 0.3  EOSABS 0.0  BASOSABS 0.0    Chemistries   Recent Labs Lab 01/01/17 1450  NA 132*  K 4.0  CL 97*  CO2 27  GLUCOSE 111*  BUN 8  CREATININE 0.71  CALCIUM 9.1   ------------------------------------------------------------------------------------------------------------------ No results for input(s): CHOL, HDL, LDLCALC, TRIG, CHOLHDL, LDLDIRECT in the last 72 hours.  No results found for: HGBA1C ------------------------------------------------------------------------------------------------------------------ No results for input(s): TSH, T4TOTAL, T3FREE, THYROIDAB in the last 72 hours.  Invalid input(s): FREET3 ------------------------------------------------------------------------------------------------------------------ No results for input(s): VITAMINB12, FOLATE, FERRITIN, TIBC, IRON, RETICCTPCT in the last 72 hours.  Coagulation profile No results for input(s): INR, PROTIME in the last 168  hours.  No results for input(s): DDIMER in the last 72 hours.  Cardiac Enzymes  Recent Labs Lab 01/02/17 0026 01/02/17 1038  TROPONINI 0.03* 0.03*   ------------------------------------------------------------------------------------------------------------------    Component Value Date/Time   BNP 83.2 01/01/2017 1433    Inpatient Medications  Scheduled Meds: . amLODipine  5 mg Oral Daily  . aspirin  325 mg Oral Daily  . folic acid  1 mg Oral Daily  . furosemide  40 mg  Intravenous Q12H  . guaiFENesin  1,200 mg Oral BID  . heparin  5,000 Units Subcutaneous Q8H  . ipratropium-albuterol  3 mL Nebulization Q6H  . methylPREDNISolone (SOLU-MEDROL) injection  40 mg Intravenous Q8H  . multivitamin with minerals  1 tablet Oral Daily  . thiamine  100 mg Oral Daily   Or  . thiamine  100 mg Intravenous Daily   Continuous Infusions: . azithromycin    . cefTRIAXone (ROCEPHIN)  IV     PRN Meds:.albuterol, LORazepam **OR** LORazepam  Micro Results No results found for this or any previous visit (from the past 240 hour(s)).  Radiology Reports Dg Chest 2 View  Result Date: 01/01/2017 CLINICAL DATA:  Per EMS- Patient from home and states she has had SOB, wheezing, and tremors all day. Patient also reports that she has used her inhaler with no relief. EXAM: CHEST  2 VIEW COMPARISON:  01/27/2016 FINDINGS: The heart is mildly enlarged. There is perihilar opacity consistent with mild edema. More focal opacity in the left lung base may be related atelectasis, infiltrate, or confluent edema. Shallow lung inflation. IMPRESSION: Cardiomegaly and changes of pulmonary edema. Possible left lower lobe infiltrate or atelectasis appear Electronically Signed   By: Nolon Nations M.D.   On: 01/01/2017 17:36     Caroline Harvey M.D on 01/02/2017 at 1:42 PM  Between 7am to 7pm - Pager - [phone]  After 7pm go to [email or web address] - password [facility]  [facility] -  Office  [phone]

## 2017-01-03 ENCOUNTER — Inpatient Hospital Stay (HOSPITAL_COMMUNITY): Payer: Self-pay

## 2017-01-03 DIAGNOSIS — I5031 Acute diastolic (congestive) heart failure: Secondary | ICD-10-CM

## 2017-01-03 LAB — TSH: TSH: 0.142 u[IU]/mL — ABNORMAL LOW (ref 0.350–4.500)

## 2017-01-03 LAB — FOLATE: Folate: 22.3 ng/mL (ref >5.9)

## 2017-01-03 LAB — VITAMIN B12: Vitamin B-12: 2254 pg/mL — ABNORMAL HIGH (ref 180–914)

## 2017-01-03 LAB — GLUCOSE, CAPILLARY: GLUCOSE-CAPILLARY: 95 mg/dL (ref 65–99)

## 2017-01-03 MED ORDER — IPRATROPIUM-ALBUTEROL 0.5-2.5 (3) MG/3ML IN SOLN
3.0000 mL | Freq: Four times a day (QID) | RESPIRATORY_TRACT | Status: DC
  Administered 2017-01-04 (×3): 3 mL via RESPIRATORY_TRACT
  Filled 2017-01-03 (×3): qty 3

## 2017-01-03 MED ORDER — METHYLPREDNISOLONE SODIUM SUCC 40 MG IJ SOLR
40.0000 mg | Freq: Once | INTRAMUSCULAR | Status: AC
  Administered 2017-01-03: 40 mg via INTRAVENOUS
  Filled 2017-01-03: qty 1

## 2017-01-03 MED ORDER — LACTULOSE 10 GM/15ML PO SOLN
20.0000 g | Freq: Two times a day (BID) | ORAL | Status: DC
  Administered 2017-01-03 – 2017-01-04 (×3): 20 g via ORAL
  Filled 2017-01-03 (×3): qty 30

## 2017-01-03 MED ORDER — AZITHROMYCIN 250 MG PO TABS
500.0000 mg | ORAL_TABLET | ORAL | Status: DC
  Administered 2017-01-03: 500 mg via ORAL
  Filled 2017-01-03: qty 2

## 2017-01-03 MED ORDER — PREDNISONE 20 MG PO TABS
40.0000 mg | ORAL_TABLET | Freq: Every day | ORAL | Status: DC
  Administered 2017-01-04: 40 mg via ORAL
  Filled 2017-01-03: qty 2

## 2017-01-03 NOTE — Progress Notes (Signed)
PROGRESS NOTE                                                                                                                                                                                                             Patient Demographics:    Caroline Harvey, is a 56 y.o. female, DOB - 1960/12/19, XJO:[id number]  Admit date - 01/01/2017   Admitting Physician Ankit Arsenio Loader, MD  Outpatient Primary MD for the patient is Rankins, Bill Salinas, MD  LOS - 1    Chief Complaint  Patient presents with  . Shortness of Breath       Brief Narrative   56 y.o. female with medical history significant of asthma and hypertension came to the ER for evaluation of shortness of breath, workup significant for pneumonia and mild volume overload, urine drug screen positive for cocaine.   Subjective:    [name] today Denies any complaints , reports no further cough, productive, overnight patient appears to be more confused, less communicative, did well with PT, was coming negative with PT staff and some friends today .   Assessment  & Plan :    Active Problems:   SOB (shortness of breath)   Respiratory failure (HCC)  Acute hypoxic respiratory failure -  87% on room air, multifactorial secondary to community-acquired pneumonia and CHF. Wean oxygen as tolerated  Community-acquired pneumonia -Chest x-ray shows signs of infiltrates suggestive of questionable pneumonia, - Continue with IV Rocephin and azithromycin, encouraged to use incentive spirometry and flutter valve, continue with pulmonary toilet, Mucinex and chest PT.  Mild acute exacerbation of mild persistent asthma - No wheezing today, will change to oral prednisone  Urine drug screen positive for cocaine - She was counseled  Acute Diastolic CHF  - Patient with picture of volume overload on presentation, mildly elevated troponin, she significantly improved on IV diuresis, 2-D echo with  preserved EF, but grade 1 diastolic dysfunction.  Alcohol abuse At risk of alcohol withdrawal - on CIWA protocol -oral vitamins, folate and thiamine started. Unable to give banana bag due to signs of fluid overload. - Patient with fluctuation in mental status, apparently sometimes she does not one answer questions and follow commands, CT head did not acute finding, mildly elevated ammonia level, continue with lactulose, recheck in a.m., check TSH, check E-15, check folic acid level  History of hypertension -Continue with Norvasc 5 mg oral daily..    Code Status : Full   Family Communication  : none at bedside  Disposition Plan  : home when stable  Consults  :  None  Procedures  : None  DVT Prophylaxis  :  Lovenox  Lab Results  Component Value Date   PLT 278 01/02/2017    Antibiotics  :    Anti-infectives    Start     Dose/Rate Route Frequency Ordered Stop   01/03/17 1800  azithromycin (ZITHROMAX) tablet 500 mg     500 mg Oral Every 24 hours 01/03/17 1500     01/02/17 1800  azithromycin (ZITHROMAX) 500 mg in dextrose 5 % 250 mL IVPB  Status:  Discontinued     500 mg 250 mL/hr over 60 Minutes Intravenous Every 24 hours 01/01/17 1814 01/03/17 1500   01/02/17 1800  cefTRIAXone (ROCEPHIN) 1 g in dextrose 5 % 50 mL IVPB  Status:  Discontinued     1 g 100 mL/hr over 30 Minutes Intravenous Every 24 hours 01/01/17 1814 01/02/17 1227   01/02/17 1800  cefTRIAXone (ROCEPHIN) 2 g in dextrose 5 % 50 mL IVPB     2 g 100 mL/hr over 30 Minutes Intravenous Every 24 hours 01/02/17 1227     01/01/17 1745  cefTRIAXone (ROCEPHIN) 1 g in dextrose 5 % 50 mL IVPB     1 g 100 mL/hr over 30 Minutes Intravenous  Once 01/01/17 1743 01/01/17 1857   01/01/17 1745  azithromycin (ZITHROMAX) 500 mg in dextrose 5 % 250 mL IVPB     500 mg 250 mL/hr over 60 Minutes Intravenous  Once 01/01/17 1743 01/01/17 1958        Objective:   Vitals:   01/02/17 2207 01/03/17 0612 01/03/17 0754 01/03/17  1400  BP: (!) 159/71 (!) 141/75  (!) 125/46  Pulse: 89 71  62  Resp: 16 16    Temp: 98.5 F (36.9 C) 98.2 F (36.8 C)  98 F (36.7 C)  TempSrc: Oral Oral  Oral  SpO2: 100% 100% 100% 100%  Weight:  127.7 kg (281 lb 8 oz)    Height:        Wt Readings from Last 3 Encounters:  01/03/17 127.7 kg (281 lb 8 oz)  01/27/16 131.1 kg (289 lb)  12/28/15 131.1 kg (289 lb)     Intake/Output Summary (Last 24 hours) at 01/03/17 1531 Last data filed at 01/02/17 1633  Gross per 24 hour  Intake                0 ml  Output              300 ml  Net             -300 ml     Physical Exam  Awake Alert, Oriented X 3,  Symmetrical Chest wall movement, decreased air movement bilaterally, scattered wheezing RRR,No Gallops,Rubs or new Murmurs, No Parasternal Heave +ve B.Sounds, Abd Soft, No tenderness,, No rebound - guarding or rigidity. No Cyanosis, Clubbing or edema, No new Rash or bruise      Data Review:    CBC  Recent Labs Lab 01/01/17 1450 01/02/17 2327  WBC 5.0 6.4  HGB 11.2* 15.3*  HCT 34.8* 45.8  PLT 225 278  MCV 85.5 84.7  MCH 27.5 28.3  MCHC 32.2 33.4  RDW 14.3 14.7  LYMPHSABS 0.8  --   MONOABS 0.3  --  EOSABS 0.0  --   BASOSABS 0.0  --     Chemistries   Recent Labs Lab 01/01/17 1450 01/02/17 2252  NA 132* 136  K 4.0 4.1  CL 97* 93*  CO2 27 32  GLUCOSE 111* 121*  BUN 8 13  CREATININE 0.71 0.81  CALCIUM 9.1 9.1  AST  --  17  ALT  --  10*  ALKPHOS  --  76  BILITOT  --  0.3   ------------------------------------------------------------------------------------------------------------------ No results for input(s): CHOL, HDL, LDLCALC, TRIG, CHOLHDL, LDLDIRECT in the last 72 hours.  No results found for: HGBA1C ------------------------------------------------------------------------------------------------------------------ No results for input(s): TSH, T4TOTAL, T3FREE, THYROIDAB in the last 72 hours.  Invalid input(s):  FREET3 ------------------------------------------------------------------------------------------------------------------ No results for input(s): VITAMINB12, FOLATE, FERRITIN, TIBC, IRON, RETICCTPCT in the last 72 hours.  Coagulation profile No results for input(s): INR, PROTIME in the last 168 hours.  No results for input(s): DDIMER in the last 72 hours.  Cardiac Enzymes  Recent Labs Lab 01/02/17 0026 01/02/17 1038  TROPONINI 0.03* 0.03*   ------------------------------------------------------------------------------------------------------------------    Component Value Date/Time   BNP 83.2 01/01/2017 1433    Inpatient Medications  Scheduled Meds: . amLODipine  5 mg Oral Daily  . aspirin  325 mg Oral Daily  . azithromycin  500 mg Oral Q24H  . folic acid  1 mg Oral Daily  . furosemide  40 mg Intravenous Q12H  . guaiFENesin  1,200 mg Oral BID  . heparin  5,000 Units Subcutaneous Q8H  . ipratropium-albuterol  3 mL Nebulization Q6H  . lactulose  20 g Oral BID  . methylPREDNISolone (SOLU-MEDROL) injection  40 mg Intravenous Q8H  . multivitamin with minerals  1 tablet Oral Daily  . thiamine  100 mg Oral Daily   Or  . thiamine  100 mg Intravenous Daily   Continuous Infusions: . cefTRIAXone (ROCEPHIN)  IV 2 g (01/02/17 1834)   PRN Meds:.albuterol, LORazepam **OR** LORazepam  Micro Results No results found for this or any previous visit (from the past 240 hour(s)).  Radiology Reports Dg Chest 2 View  Result Date: 01/01/2017 CLINICAL DATA:  Per EMS- Patient from home and states she has had SOB, wheezing, and tremors all day. Patient also reports that she has used her inhaler with no relief. EXAM: CHEST  2 VIEW COMPARISON:  01/27/2016 FINDINGS: The heart is mildly enlarged. There is perihilar opacity consistent with mild edema. More focal opacity in the left lung base may be related atelectasis, infiltrate, or confluent edema. Shallow lung inflation. IMPRESSION:  Cardiomegaly and changes of pulmonary edema. Possible left lower lobe infiltrate or atelectasis appear Electronically Signed   By: Nolon Nations M.D.   On: 01/01/2017 17:36   Ct Head Wo Contrast  Result Date: 01/03/2017 CLINICAL DATA:  The patient is confused, unable to state the year of her birth or tell me her age , constantly repeating 11/[birth year]. Does not know address, urinary incontinence, hands me her rt arm when asking for left arm with ID bracelet, cant read signs on wall. Drowsy. Scans repeated. EXAM: CT HEAD WITHOUT CONTRAST TECHNIQUE: Contiguous axial images were obtained from the base of the skull through the vertex without intravenous contrast. COMPARISON:  None. FINDINGS: Brain: No evidence of acute infarction, hemorrhage, hydrocephalus, extra-axial collection or mass lesion/mass effect. Vascular: No hyperdense vessel or unexpected calcification. Skull: Normal. Negative for fracture or focal lesion. Sinuses/Orbits: There is mucosal thickening of the frontal, ethmoid, maxillary, and sphenoid sinuses. Mastoid air cells are normally aerated. Orbits  are intact. Other: None IMPRESSION: 1.  No evidence for acute intracranial abnormality. 2. Sinus disease. Electronically Signed   By: Nolon Nations M.D.   On: 01/03/2017 11:38     Keyarra Rendall M.D on 01/03/2017 at 3:31 PM  Between 7am to 7pm - Pager - [phone]  After 7pm go to [email or web address] - password [facility]  [facility] -  Office  [phone]

## 2017-01-03 NOTE — Evaluation (Signed)
Physical Therapy One Time Evaluation Patient Details Name: Caroline Harvey MRN: [id number] DOB: 22-Dec-1960 Today's Date: 01/03/2017   History of Present Illness  56 y.o. female with medical history significant of asthma and hypertension and admitted for pneumonia and mild volume overload, urine drug screen positive for cocaine  Clinical Impression  Patient evaluated by Physical Therapy with no further acute PT needs identified. All education has been completed and the patient has no further questions.  Pt mobilizing slowly but at supervision level at this time.  Pt appears to be less confused and have improved cognition as compared to earlier progress notes. See below for any follow-up Physical Therapy or equipment needs. PT is signing off. Thank you for this referral.     Follow Up Recommendations No PT follow up    Equipment Recommendations  None recommended by PT    Recommendations for Other Services       Precautions / Restrictions Precautions Precautions: Fall      Mobility  Bed Mobility               General bed mobility comments: pt up in recliner on arrival  Transfers Overall transfer level: Needs assistance Equipment used: None Transfers: Sit to/from Stand Sit to Stand: Min guard;Supervision         General transfer comment: pt used UEs to self assist  Ambulation/Gait Ambulation/Gait assistance: Min guard;Supervision Ambulation Distance (Feet): 160 Feet Assistive device: None Gait Pattern/deviations: Step-through pattern;Decreased stride length     General Gait Details: very slow pace however did not observe unsteadiness or LOB  Stairs            Wheelchair Mobility    Modified Rankin (Stroke Patients Only)       Balance                                             Pertinent Vitals/Pain Pain Assessment: No/denies pain    Home Living Family/patient expects to be discharged to:: Private residence Living  Arrangements: Parent (mother) Available Help at Discharge: Family;Available PRN/intermittently Type of Home: House Home Access: Ramped entrance     Home Layout: One level Home Equipment: None      Prior Function Level of Independence: Independent               Hand Dominance        Extremity/Trunk Assessment   Upper Extremity Assessment Upper Extremity Assessment: Overall WFL for tasks assessed    Lower Extremity Assessment Lower Extremity Assessment: Overall WFL for tasks assessed       Communication   Communication: No difficulties  Cognition Arousal/Alertness: Awake/alert Behavior During Therapy: WFL for tasks assessed/performed Overall Cognitive Status: Impaired/Different from baseline                                 General Comments: pt able to recall she is at [facility], presents with more intact cognition, friend present and reports cognition seems closer to baseline      General Comments      Exercises     Assessment/Plan    PT Assessment Patent does not need any further PT services  PT Problem List         PT Treatment Interventions      PT Goals (Current goals can be found  in the Care Plan section)  Acute Rehab PT Goals PT Goal Formulation: All assessment and education complete, DC therapy    Frequency     Barriers to discharge        Co-evaluation               AM-PAC PT "6 Clicks" Daily Activity  Outcome Measure Difficulty turning over in bed (including adjusting bedclothes, sheets and blankets)?: None Difficulty moving from lying on back to sitting on the side of the bed? : A Little Difficulty sitting down on and standing up from a chair with arms (e.g., wheelchair, bedside commode, etc,.)?: A Little Help needed moving to and from a bed to chair (including a wheelchair)?: A Little Help needed walking in hospital room?: A Little Help needed climbing 3-5 steps with a railing? : A Little 6 Click Score:  19    End of Session Equipment Utilized During Treatment: Gait belt Activity Tolerance: Patient tolerated treatment well Patient left: in chair;with call bell/phone within reach;with chair alarm set Nurse Communication: Mobility status (IV disconnected from pt and dripping on floor upon arrival to room) PT Visit Diagnosis: Difficulty in walking, not elsewhere classified (R26.2)    Time: 1607-3710 PT Time Calculation (min) (ACUTE ONLY): 15 min   Charges:   PT Evaluation $PT Eval Low Complexity: 1 Low     PT G CodesCarmelia Bake, PT, DPT 01/03/2017 Pager: [phone]  York Ram E 01/03/2017, 3:21 PM

## 2017-01-03 NOTE — Progress Notes (Signed)
PHARMACIST - PHYSICIAN COMMUNICATION CONCERNING: Antibiotic IV to Oral Route Change Policy  RECOMMENDATION: This patient is receiving azithromcyin by the intravenous route.  Based on criteria approved by the Pharmacy and Therapeutics Committee, the antibiotic(s) is/are being converted to the equivalent oral dose form(s).   DESCRIPTION: These criteria include:  Patient being treated for a respiratory tract infection, urinary tract infection, cellulitis or clostridium difficile associated diarrhea if on metronidazole  The patient is not neutropenic and does not exhibit a GI malabsorption state  The patient is eating (either orally or via tube) and/or has been taking other orally administered medications for a least 24 hours  The patient is improving clinically and has a Tmax < 100.5  If you have questions about this conversion, please contact the Pharmacy Department  []   [phone] )  [facility] []   [phone] )  [facility] []   [phone] )  [facility] []   [phone] )  [facility] [x]   [phone] )  [place], PharmD, [place] Pager: [phone] 01/03/2017 2:59 PM

## 2017-01-04 LAB — GLUCOSE, CAPILLARY: Glucose-Capillary: 100 mg/dL — ABNORMAL HIGH (ref 65–99)

## 2017-01-04 LAB — AMMONIA: Ammonia: 39 umol/L — ABNORMAL HIGH (ref 9–35)

## 2017-01-04 LAB — T4, FREE: FREE T4: 1.09 ng/dL (ref 0.61–1.12)

## 2017-01-04 MED ORDER — LACTULOSE 10 GM/15ML PO SOLN: 20.0000 g | Freq: Two times a day (BID) | ORAL | 0 refills | Status: DC

## 2017-01-04 MED ORDER — LEVOFLOXACIN 750 MG PO TABS: 750.0000 mg | ORAL_TABLET | Freq: Every day | ORAL | 0 refills | Status: AC

## 2017-01-04 MED ORDER — THIAMINE HCL 100 MG PO TABS: 100.0000 mg | ORAL_TABLET | Freq: Every day | ORAL | 0 refills | Status: DC

## 2017-01-04 MED ORDER — FOLIC ACID 1 MG PO TABS: 1.0000 mg | ORAL_TABLET | Freq: Every day | ORAL | 0 refills | Status: DC

## 2017-01-04 MED ORDER — GUAIFENESIN ER 600 MG PO TB12: 1200.0000 mg | ORAL_TABLET | Freq: Two times a day (BID) | ORAL | 0 refills | Status: DC

## 2017-01-04 MED ORDER — ADULT MULTIVITAMIN W/MINERALS CH: 1.0000 | ORAL_TABLET | Freq: Every day | ORAL | 0 refills | Status: DC

## 2017-01-04 MED ORDER — FUROSEMIDE 40 MG PO TABS: 40.0000 mg | ORAL_TABLET | Freq: Every day | ORAL | 0 refills | Status: DC

## 2017-01-04 MED ORDER — PREDNISONE 10 MG (21) PO TBPK: ORAL_TABLET | ORAL | 0 refills | Status: DC

## 2017-01-04 NOTE — Discharge Instructions (Signed)
Follow with Primary MD Rankins, Bill Salinas, MD in 7 days   Get CBC, CMP, 2 view Chest X ray checked  by Primary MD next visit. Repeat TSH,free T4, during next visit.   Activity: As tolerated with Full fall precautions use walker/cane & assistance as needed   Disposition Home    Diet: Regular diet, with feeding assistance and aspiration precautions.  For Heart failure patients - Check your Weight same time everyday, if you gain over 2 pounds, or you develop in leg swelling, experience more shortness of breath or chest pain, call your Primary MD immediately. Follow Cardiac Low Salt Diet and 1.5 lit/day fluid restriction.   On your next visit with your primary care physician please Get Medicines reviewed and adjusted.   Please request your Prim.MD to go over all Hospital Tests and Procedure/Radiological results at the follow up, please get all Hospital records sent to your Prim MD by signing hospital release before you go home.   If you experience worsening of your admission symptoms, develop shortness of breath, life threatening emergency, suicidal or homicidal thoughts you must seek medical attention immediately by calling 911 or calling your MD immediately  if symptoms less severe.  You Must read complete instructions/literature along with all the possible adverse reactions/side effects for all the Medicines you take and that have been prescribed to you. Take any new Medicines after you have completely understood and accpet all the possible adverse reactions/side effects.   Do not drive, operating heavy machinery, perform activities at heights, swimming or participation in water activities or provide baby sitting services if your were admitted for syncope or siezures until you have seen by Primary MD or a Neurologist and advised to do so again.  Do not drive when taking Pain medications.    Do not take more than prescribed Pain, Sleep and Anxiety Medications  Special Instructions: If  you have smoked or chewed Tobacco  in the last 2 yrs please stop smoking, stop any regular Alcohol  and or any Recreational drug use.  Wear Seat belts while driving.   Please note  You were cared for by a hospitalist during your hospital stay. If you have any questions about your discharge medications or the care you received while you were in the hospital after you are discharged, you can call the unit and asked to speak with the hospitalist on call if the hospitalist that took care of you is not available. Once you are discharged, your primary care physician will handle any further medical issues. Please note that NO REFILLS for any discharge medications will be authorized once you are discharged, as it is imperative that you return to your primary care physician (or establish a relationship with a primary care physician if you do not have one) for your aftercare needs so that they can reassess your need for medications and monitor your lab values.

## 2017-01-04 NOTE — Discharge Summary (Signed)
Caroline Harvey, is a 56 y.o. female  DOB 03-17-1961  MRN [id number].  Admission date:  01/01/2017  Admitting Physician  Ankit Arsenio Loader, MD  Discharge Date:  01/04/2017   Primary MD  Rankins, Bill Salinas, MD  Recommendations for primary care physician for things to follow:  - Please check CBC, BMP during next visit - Please follow final results on free T4, was sent that day of discharge, results still pending, as was obtained regarding abnormal TSH. Further workup as an outpatient for abnormal TSH please see note below   Admission Diagnosis  Lobar pneumonia ([place]) [J18.1] Acute pulmonary edema ([place]) [J81.0] Mild persistent asthma with exacerbation [J45.31]   Discharge Diagnosis  Lobar pneumonia ([place]) [J18.1] Acute pulmonary edema ([place]) [J81.0] Mild persistent asthma with exacerbation [J45.31]    Active Problems:   SOB (shortness of breath)   Respiratory failure ([place])      Past Medical History:  Diagnosis Date  . Alcohol abuse   . Anemia   . Hypertension   . Hyponatremia 09/2014    Past Surgical History:  Procedure Laterality Date  . ANKLE SURGERY Right    x  2       History of present illness and  Hospital Course:     Kindly see H&P for history of present illness and admission details, please review complete Labs, Consult reports and Test reports for all details in brief  HPI  from the history and physical done on the day of admission 01/01/2017  HPI: Caroline Harvey is a 56 y.o. female with medical history significant of asthma and hypertension came to the ER for evaluation of shortness of breath. Patient is drowsy at this time but appropriately responding to all the questions and her neuro exam is nonfocal. Patient states she has been feeling short of breath for past few days especially with exertion and today she tried using her friend's inhaler as she thinks she was having asthma  exacerbation. Despite of using the inhaler several times her respiratory status did not improve therefore ended up calling EMS. In the EMS she received a dose of Solu-Medrol and magnesium with minimal improvement in her breathing. She has been having mild orthopnea and may be low-grade temperatures at home. She is also noticed some lower extremity swelling. No previous cardiac history. Admits of heavily drinking hard liquor at home with her last drink being over 24 hours ago but she does not want to exactly state how much enough what she drinks. She denies any illicit drug use or tobacco use. Denies any recent sick contacts.  In the ER patient was noted to have significant coarse breath sounds with some wheezing. She was started on Lasix 20 mg IV, nebulizer and was given IV steriods (in EMS). Her labs were generally unremarkable and her chest x-ray showed cardiomegaly with signs of pulmonary edema and possible infiltrate suggestive of early pneumonia. She was also given IV ceftriaxone and azithromycin. On her vital signs she was afebrile but was slightly hypoxic with oxygen saturation  90-92% on room air.  Hospital Course  56 y.o.femalewith medical history significant of asthma and hypertension came to the ER for evaluation of shortness of breath, workup significant for pneumonia and mild volume overload, urine drug screen positive for cocaine.  Acute hypoxic respiratory failure -  87% on room air, multifactorial secondary to community-acquired pneumonia, currently on room air.  Community-acquired pneumonia -Chest x-ray shows signs of infiltrates suggestive of questionable pneumonia, - Treated with IV Rocephin and azithromycin during hospital stay, to discharge another 3 days of oral levofloxacin, encouraged to take her incentive spirometry and flutter valve and to continue using at home, continue with the Mucinex .  Mild acute exacerbation of mild persistent asthma - Continue with prednisone  taper on discharge  Urine drug screen positive for cocaine - Discussed with patient, she denies any drug use.!  Acute Diastolic CHF  - Patient with picture of volume overload on presentation, mildly elevated troponin, she significantly improved on IV diuresis, 2-D echo with preserved EF, but grade 1 diastolic dysfunction. Her home dose Lasix was increased from 20-40 mg oral daily  Alcohol abuse - on CIWA protocol during hospital stay, mellitus of withdrawal, will discharge on thiamine, folic acid and multivitamins. - Shunt with ammonia level XLIX, she was started on lactulose. Discharge.  Abnormal TSH level - Patient with low TSH level, free T4 pending at time of discharge, was not started on any medication during hospital stay, PCP to follow final results of free T4, to see if she has subclinical hyperthyroidism, and treat if indicated.  History of hypertension -Continue with Norvasc 5 mg oral daily.Marland Kitchen   Discharge Condition:  Stable Discussed with daughter at bedside   Follow UP  [facility] Follow up today.   Specialty:  Emergency Medicine Why:  If symptoms worsen Contact information: [place] [id number] [place] [phone]            Discharge Instructions  and  Discharge Medications     Discharge Instructions    Discharge instructions    Complete by:  As directed    Follow with Primary MD Rankins, Bill Salinas, MD in 7 days   Get CBC, CMP, 2 view Chest X ray checked  by Primary MD next visit. Repeat TSH,free T4, during next visit.   Activity: As tolerated with Full fall precautions use walker/cane & assistance as needed   Disposition Home    Diet: Regular diet, with feeding assistance and aspiration precautions.  For Heart failure patients - Check your Weight same time everyday, if you gain over 2 pounds, or you develop in leg swelling, experience  more shortness of breath or chest pain, call your Primary MD immediately. Follow Cardiac Low Salt Diet and 1.5 lit/day fluid restriction.   On your next visit with your primary care physician please Get Medicines reviewed and adjusted.   Please request your Prim.MD to go over all Hospital Tests and Procedure/Radiological results at the follow up, please get all Hospital records sent to your Prim MD by signing hospital release before you go home.   If you experience worsening of your admission symptoms, develop shortness of breath, life threatening emergency, suicidal or homicidal thoughts you must seek medical attention immediately by calling 911 or calling your MD immediately  if symptoms less severe.  You Must read complete instructions/literature along with all the possible adverse reactions/side effects for all the Medicines you take and that have been  prescribed to you. Take any new Medicines after you have completely understood and accpet all the possible adverse reactions/side effects.   Do not drive, operating heavy machinery, perform activities at heights, swimming or participation in water activities or provide baby sitting services if your were admitted for syncope or siezures until you have seen by Primary MD or a Neurologist and advised to do so again.  Do not drive when taking Pain medications.    Do not take more than prescribed Pain, Sleep and Anxiety Medications  Special Instructions: If you have smoked or chewed Tobacco  in the last 2 yrs please stop smoking, stop any regular Alcohol  and or any Recreational drug use.  Wear Seat belts while driving.   Please note  You were cared for by a hospitalist during your hospital stay. If you have any questions about your discharge medications or the care you received while you were in the hospital after you are discharged, you can call the unit and asked to speak with the hospitalist on call if the hospitalist that took care of  you is not available. Once you are discharged, your primary care physician will handle any further medical issues. Please note that NO REFILLS for any discharge medications will be authorized once you are discharged, as it is imperative that you return to your primary care physician (or establish a relationship with a primary care physician if you do not have one) for your aftercare needs so that they can reassess your need for medications and monitor your lab values.   Increase activity slowly    Complete by:  As directed      Allergies as of 01/04/2017   No Known Allergies     Medication List    STOP taking these medications   predniSONE 50 MG tablet Commonly known as:  DELTASONE Replaced by:  predniSONE 10 MG (21) Tbpk tablet     TAKE these medications   albuterol 108 (90 Base) MCG/ACT inhaler Commonly known as:  PROVENTIL HFA;VENTOLIN HFA Inhale 1-2 puffs into the lungs every 6 (six) hours as needed for wheezing or shortness of breath.   amLODipine 5 MG tablet Commonly known as:  NORVASC Take 5 mg by mouth daily.   chlorpheniramine-HYDROcodone 10-8 MG/5ML Suer Commonly known as:  TUSSIONEX Take 5 mLs by mouth every 12 (twelve) hours as needed for cough.   folic acid 1 MG tablet Commonly known as:  FOLVITE Take 1 tablet (1 mg total) by mouth daily.   furosemide 40 MG tablet Commonly known as:  LASIX Take 1 tablet (40 mg total) by mouth daily. What changed:  medication strength  how much to take   guaiFENesin 600 MG 12 hr tablet Commonly known as:  MUCINEX Take 2 tablets (1,200 mg total) by mouth 2 (two) times daily.   lactulose 10 GM/15ML solution Commonly known as:  CHRONULAC Take 30 mLs (20 g total) by mouth 2 (two) times daily.   levofloxacin 750 MG tablet Commonly known as:  LEVAQUIN Take 1 tablet (750 mg total) by mouth daily.   multivitamin with minerals Tabs tablet Take 1 tablet by mouth daily.   predniSONE 10 MG (21) Tbpk tablet Commonly known as:   STERAPRED UNI-PAK 21 TAB Please follow instruction in  package Replaces:  predniSONE 50 MG tablet   thiamine 100 MG tablet Take 1 tablet (100 mg total) by mouth daily.         Diet and Activity recommendation: See Discharge Instructions above  Consults obtained -  None   Major procedures and Radiology Reports - PLEASE review detailed and final reports for all details, in brief -     Dg Chest 2 View  Result Date: 01/01/2017 CLINICAL DATA:  Per EMS- Patient from home and states she has had SOB, wheezing, and tremors all day. Patient also reports that she has used her inhaler with no relief. EXAM: CHEST  2 VIEW COMPARISON:  01/27/2016 FINDINGS: The heart is mildly enlarged. There is perihilar opacity consistent with mild edema. More focal opacity in the left lung base may be related atelectasis, infiltrate, or confluent edema. Shallow lung inflation. IMPRESSION: Cardiomegaly and changes of pulmonary edema. Possible left lower lobe infiltrate or atelectasis appear Electronically Signed   By: Nolon Nations M.D.   On: 01/01/2017 17:36   Ct Head Wo Contrast  Result Date: 01/03/2017 CLINICAL DATA:  The patient is confused, unable to state the year of her birth or tell me her age , constantly repeating 11/17. Does not know address, urinary incontinence, hands me her rt arm when asking for left arm with ID bracelet, cant read signs on wall. Drowsy. Scans repeated. EXAM: CT HEAD WITHOUT CONTRAST TECHNIQUE: Contiguous axial images were obtained from the base of the skull through the vertex without intravenous contrast. COMPARISON:  None. FINDINGS: Brain: No evidence of acute infarction, hemorrhage, hydrocephalus, extra-axial collection or mass lesion/mass effect. Vascular: No hyperdense vessel or unexpected calcification. Skull: Normal. Negative for fracture or focal lesion. Sinuses/Orbits: There is mucosal thickening of the frontal, ethmoid, maxillary, and sphenoid sinuses. Mastoid air cells are  normally aerated. Orbits are intact. Other: None IMPRESSION: 1.  No evidence for acute intracranial abnormality. 2. Sinus disease. Electronically Signed   By: Nolon Nations M.D.   On: 01/03/2017 11:38    Micro Results     No results found for this or any previous visit (from the past 240 hour(s)).     Today   Subjective:   Caroline Harvey today has no headache,no chest OR abdominal pain,no new weakness tingling or numbness, feels much better wants to go home today.   Objective:   Blood pressure (!) 154/76, pulse 70, temperature 98.8 F (37.1 C), temperature source Oral, resp. rate 16, height 5\' 7"  (1.702 m), weight 125 kg (275 lb 9.6 oz), last menstrual period 12/14/2010, SpO2 94 %.   Intake/Output Summary (Last 24 hours) at 01/04/17 1356 Last data filed at 01/04/17 0755  Gross per 24 hour  Intake              660 ml  Output                1 ml  Net              659 ml    Exam Awake Alert, Oriented x 3, No new F.N deficits, Normal affect Symmetrical Chest wall movement, Good air movement bilaterally, CTAB RRR,No Gallops,Rubs or new Murmurs, No Parasternal Heave +ve B.Sounds, Abd Soft, Non tender, No rebound -guarding or rigidity. No Cyanosis, Clubbing or edema, No new Rash or bruise  Data Review   CBC w Diff:  Lab Results  Component Value Date   WBC 6.4 01/02/2017   HGB 15.3 (H) 01/02/2017   HCT 45.8 01/02/2017   PLT 278 01/02/2017   LYMPHOPCT 16 01/01/2017   MONOPCT 5 01/01/2017   EOSPCT 0 01/01/2017   BASOPCT 0 01/01/2017    CMP:  Lab Results  Component Value Date   NA  136 01/02/2017   K 4.1 01/02/2017   CL 93 (L) 01/02/2017   CO2 32 01/02/2017   BUN 13 01/02/2017   CREATININE 0.81 01/02/2017   PROT 7.7 01/02/2017   ALBUMIN 3.9 01/02/2017   BILITOT 0.3 01/02/2017   ALKPHOS 76 01/02/2017   AST 17 01/02/2017   ALT 10 (L) 01/02/2017  .   Total Time in preparing paper work, data evaluation and todays exam - 35 minutes  Caroline Harvey M.D  on 01/04/2017 at [facility]   Office  [phone]

## 2017-01-04 NOTE — Care Management Note (Signed)
Case Management Note  Patient Details  Name: Caroline Harvey MRN: [id number] Date of Birth: 1960-06-01  Subjective/Objective:    Lobar pneumonia, acute pulm edema                Action/Plan: Discharge Planning: NCM spoke to pt and states she has insurance. Faxed Rx and insurance card to her pharmacy [facility] ([place]). Will fax insurance card to [facility] registration.   PCP Harlene Ramus MD  Expected Discharge Date:  01/04/17               Expected Discharge Plan:  Home/Self Care  In-House Referral:  NA  Discharge planning Services  CM Consult, Medication Assistance  Post Acute Care Choice:  NA Choice offered to:  NA  DME Arranged:  N/A DME Agency:  NA  HH Arranged:  NA HH Agency:  NA  Status of Service:  Completed, signed off  If discussed at [place] of Stay Meetings, dates discussed:    Additional Comments:  Erenest Rasher, RN 01/04/2017, 5:01 PM

## 2017-01-08 NOTE — Progress Notes (Addendum)
Discharge Planning:  Received call from pt stating her insurance had expired. Faxed [place] letter to [employer] per her request. Explained she would have a $3 copay for each except narcotics. MATCH can be utilized once per year. Provided pt with contact number for [facility] to call to schedule a follow up appt. Jonnie Finner RN CCM Case Mgmt phone [phone]

## 2017-06-18 ENCOUNTER — Other Ambulatory Visit: Payer: Self-pay | Admitting: Obstetrics and Gynecology

## 2017-06-18 DIAGNOSIS — Z1231 Encounter for screening mammogram for malignant neoplasm of breast: Secondary | ICD-10-CM

## 2017-07-17 ENCOUNTER — Other Ambulatory Visit: Payer: Self-pay | Admitting: Family Medicine

## 2017-07-17 ENCOUNTER — Ambulatory Visit (HOSPITAL_COMMUNITY): Payer: Self-pay

## 2017-07-17 ENCOUNTER — Ambulatory Visit
Admission: RE | Admit: 2017-07-17 | Discharge: 2017-07-17 | Disposition: A | Discharge status: Home / Self Care | Payer: BLUE CROSS/BLUE SHIELD | Source: Ambulatory Visit | Attending: Obstetrics and Gynecology | Admitting: Obstetrics and Gynecology

## 2017-07-17 DIAGNOSIS — Z1231 Encounter for screening mammogram for malignant neoplasm of breast: Secondary | ICD-10-CM

## 2018-07-22 ENCOUNTER — Encounter (HOSPITAL_COMMUNITY): Payer: Self-pay | Admitting: Emergency Medicine

## 2018-07-22 ENCOUNTER — Emergency Department (HOSPITAL_COMMUNITY)
Admission: EM | Admit: 2018-07-22 | Discharge: 2018-07-22 | Disposition: A | Discharge status: Home / Self Care | Payer: Self-pay | Attending: Emergency Medicine | Admitting: Emergency Medicine

## 2018-07-22 ENCOUNTER — Emergency Department (HOSPITAL_COMMUNITY): Payer: Self-pay

## 2018-07-22 DIAGNOSIS — I10 Essential (primary) hypertension: Secondary | ICD-10-CM | POA: Insufficient documentation

## 2018-07-22 DIAGNOSIS — R600 Localized edema: Secondary | ICD-10-CM | POA: Insufficient documentation

## 2018-07-22 LAB — COMPREHENSIVE METABOLIC PANEL
ALBUMIN: 4 g/dL (ref 3.5–5.0)
ALT: 10 U/L (ref 0–44)
AST: 22 U/L (ref 15–41)
Alkaline Phosphatase: 64 U/L (ref 38–126)
Anion gap: 8 (ref 5–15)
BILIRUBIN TOTAL: 1.7 mg/dL — AB (ref 0.3–1.2)
BUN: 15 mg/dL (ref 6–20)
CO2: 32 mmol/L (ref 22–32)
Calcium: 8.9 mg/dL (ref 8.9–10.3)
Chloride: 96 mmol/L — ABNORMAL LOW (ref 98–111)
Creatinine, Ser: 0.94 mg/dL (ref 0.44–1.00)
GFR calc Af Amer: >60 mL/min (ref >60)
GFR calc non Af Amer: >60 mL/min (ref >60)
GLUCOSE: 111 mg/dL — AB (ref 70–99)
POTASSIUM: 3.4 mmol/L — AB (ref 3.5–5.1)
SODIUM: 136 mmol/L (ref 135–145)
Total Protein: 6.8 g/dL (ref 6.5–8.1)

## 2018-07-22 LAB — CBC WITH DIFFERENTIAL/PLATELET
Abs Immature Granulocytes: 0.01 10*3/uL (ref 0.00–0.07)
BASOS ABS: 0 10*3/uL (ref 0.0–0.1)
Basophils Relative: 0 %
EOS ABS: 0 10*3/uL (ref 0.0–0.5)
Eosinophils Relative: 1 %
HEMATOCRIT: 32.9 % — AB (ref 36.0–46.0)
Hemoglobin: 10.3 g/dL — ABNORMAL LOW (ref 12.0–15.0)
IMMATURE GRANULOCYTES: 0 %
LYMPHS ABS: 1.2 10*3/uL (ref 0.7–4.0)
Lymphocytes Relative: 26 %
MCH: 33.6 pg (ref 26.0–34.0)
MCHC: 31.3 g/dL (ref 30.0–36.0)
MCV: 107.2 fL — ABNORMAL HIGH (ref 80.0–100.0)
Monocytes Absolute: 0.5 10*3/uL (ref 0.1–1.0)
Monocytes Relative: 11 %
NEUTROS PCT: 62 %
NRBC: 0 % (ref 0.0–0.2)
Neutro Abs: 2.9 10*3/uL (ref 1.7–7.7)
Platelets: 254 10*3/uL (ref 150–400)
RBC: 3.07 MIL/uL — AB (ref 3.87–5.11)
RDW: 20.5 % — AB (ref 11.5–15.5)
WBC: 4.7 10*3/uL (ref 4.0–10.5)

## 2018-07-22 LAB — I-STAT TROPONIN, ED: TROPONIN I, POC: 0 ng/mL (ref 0.00–0.08)

## 2018-07-22 LAB — BRAIN NATRIURETIC PEPTIDE: B NATRIURETIC PEPTIDE 5: 41 pg/mL (ref 0.0–100.0)

## 2018-07-22 MED ORDER — FUROSEMIDE 40 MG PO TABS: 40.0000 mg | ORAL_TABLET | Freq: Every day | ORAL | 0 refills | Status: DC

## 2018-07-22 NOTE — ED Provider Notes (Signed)
[facility] DEPT Provider Note   CSN: [id number] Arrival date & time: 07/22/18  0503    History   Chief Complaint Chief Complaint  Patient presents with  . Leg Pain    HPI Caroline Harvey is a 58 y.o. female.     HPI   Pt is a 58 y/o female with a h/o asthma who presents to the ED today for eval of BLE swelling that began 1 week ago. States that because of this her legs feel tight. States that every now and then she gets short of breath however since her legs have been swelling her sob is worse. Has some DOE as well. No chest pain or cough. No fevers or chills.   Denies leg pain/swelling, hemoptysis, recent surgery/trauma, recent long travel, hormone use, personal hx of cancer, or hx of DVT/PE.   Past Medical History:  Diagnosis Date  . Alcohol abuse   . Anemia   . Hypertension   . Hyponatremia 09/2014    Patient Active Problem List   Diagnosis Date Noted  . Respiratory failure ([place]) 01/02/2017  . SOB (shortness of breath) 01/01/2017  . Asthma exacerbation 01/28/2016  . Bronchiolitis 01/27/2016  . Hyponatremia 10/03/2014  . Nausea vomiting and diarrhea 10/03/2014  . Leukopenia 10/03/2014  . Acute kidney injury ([place]) 10/03/2014  . 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 FRACTURE, ANKLE, RIGHT 07/05/2009  . GERD 02/07/2009  . BACK PAIN, LUMBAR 02/24/2008  . GOUT, UNSPECIFIED 11/25/2007  . Alcohol abuse 11/25/2007  . SUPERFICIAL THROMBOPHLEBITIS 03/18/2007  . METATARSALGIA 03/18/2007  . VARICOSE VEIN 02/18/2007  . Pain in limb 02/18/2007  . B12 DEFICIENCY 01/12/2007  . OBESITY NOS 01/12/2007  . HYPERTENSION, BENIGN 01/12/2007  . EDEMA LEG 01/12/2007    Past Surgical History:  Procedure Laterality Date  . ANKLE SURGERY Right    x  2     OB History    Gravida  1   Para  1   Term  1   Preterm      AB      Living  1     SAB      TAB      Ectopic      Multiple      Live Births               Home Medications     Prior to Admission medications   Medication Sig Start Date End Date Taking? Authorizing Provider  albuterol (PROVENTIL HFA;VENTOLIN HFA) 108 (90 Base) MCG/ACT inhaler Inhale 1-2 puffs into the lungs every 6 (six) hours as needed for wheezing or shortness of breath. Patient not taking: Reported on 07/22/2018 01/29/16   Robbie Lis, MD  chlorpheniramine-HYDROcodone (TUSSIONEX) 10-8 MG/5ML SUER Take 5 mLs by mouth every 12 (twelve) hours as needed for cough. Patient not taking: Reported on 07/22/2018 01/29/16   Robbie Lis, MD  folic acid (FOLVITE) 1 MG tablet Take 1 tablet (1 mg total) by mouth daily. Patient not taking: Reported on 07/22/2018 01/05/17   Elgergawy, Silver Huguenin, MD  furosemide (LASIX) 40 MG tablet Take 1 tablet (40 mg total) by mouth daily for 3 days. 07/22/18 07/25/18  Michelangelo Rindfleisch S, PA-C  guaiFENesin (MUCINEX) 600 MG 12 hr tablet Take 2 tablets (1,200 mg total) by mouth 2 (two) times daily. Patient not taking: Reported on 07/22/2018 01/04/17   Elgergawy, Silver Huguenin, MD  lactulose (CHRONULAC) 10 GM/15ML solution Take 30 mLs (20 g total) by mouth 2 (two) times daily.  Patient not taking: Reported on 07/22/2018 01/04/17   Elgergawy, Silver Huguenin, MD  Multiple Vitamin (MULTIVITAMIN WITH MINERALS) TABS tablet Take 1 tablet by mouth daily. Patient not taking: Reported on 07/22/2018 01/05/17   Elgergawy, Silver Huguenin, MD  predniSONE (STERAPRED UNI-PAK 21 TAB) 10 MG (21) TBPK tablet Please follow instruction in  package Patient not taking: Reported on 07/22/2018 01/04/17   Elgergawy, Silver Huguenin, MD  thiamine 100 MG tablet Take 1 tablet (100 mg total) by mouth daily. Patient not taking: Reported on 07/22/2018 01/05/17   Elgergawy, Silver Huguenin, MD    Family History Family History  Problem Relation Age of Onset  . Hypertension Mother   . Breast cancer Paternal Aunt        ? age of onset  . Breast cancer Paternal Aunt        ? age of onset    Social History Social History   Tobacco Use  . Smoking  status: Never Smoker  . Smokeless tobacco: Never Used  Substance Use Topics  . Alcohol use: Yes    Comment: Quit May 20 2015  . Drug use: No     Allergies   Patient has no known allergies.   Review of Systems Review of Systems  Constitutional: Negative for chills and fever.  HENT: Negative for congestion.   Eyes: Negative for visual disturbance.  Respiratory: Positive for shortness of breath. Negative for cough.   Cardiovascular: Positive for leg swelling. Negative for chest pain.  Gastrointestinal: Negative for abdominal pain, constipation, diarrhea, nausea and vomiting.  Genitourinary: Negative for dysuria.  Musculoskeletal: Negative for back pain.  Skin: Negative for rash.  Neurological: Negative for headaches.     Physical Exam Updated Vital Signs BP (!) 141/63   Pulse 77   Temp 98.3 F (36.8 C) (Oral)   Resp 18   Ht 5\' 9"  (1.753 m)   Wt 127 kg   LMP 12/14/2010   SpO2 94%   BMI 41.35 kg/m   Physical Exam Vitals signs and nursing note reviewed.  Constitutional:      General: She is not in acute distress.    Appearance: She is well-developed.     Comments: obese  HENT:     Head: Normocephalic and atraumatic.  Eyes:     Conjunctiva/sclera: Conjunctivae normal.  Neck:     Musculoskeletal: Neck supple.  Cardiovascular:     Rate and Rhythm: Normal rate and regular rhythm.     Pulses: Normal pulses.          Dorsalis pedis pulses are 2+ on the right side and 2+ on the left side.     Heart sounds: Murmur (systolic) present.  Pulmonary:     Effort: Pulmonary effort is normal. No respiratory distress.     Breath sounds: Normal breath sounds. No stridor. No wheezing or rhonchi.  Abdominal:     Palpations: Abdomen is soft.     Tenderness: There is no abdominal tenderness.  Musculoskeletal:     Right lower leg: Edema (2+) present.     Left lower leg: Edema (2+) present.  Skin:    General: Skin is warm and dry.  Neurological:     Mental Status: She is  alert.    ED Treatments / Results  Labs (all labs ordered are listed, but only abnormal results are displayed) Labs Reviewed  CBC WITH DIFFERENTIAL/PLATELET - Abnormal; Notable for the following components:      Result Value   RBC 3.07 (*)  Hemoglobin 10.3 (*)    HCT 32.9 (*)    MCV 107.2 (*)    RDW 20.5 (*)    All other components within normal limits  COMPREHENSIVE METABOLIC PANEL - Abnormal; Notable for the following components:   Potassium 3.4 (*)    Chloride 96 (*)    Glucose, Bld 111 (*)    Total Bilirubin 1.7 (*)    All other components within normal limits  BRAIN NATRIURETIC PEPTIDE  I-STAT TROPONIN, ED    EKG EKG Interpretation  Date/Time:  Wednesday July 22 2018 08:07:24 EST Ventricular Rate:  82 PR Interval:    QRS Duration: 104 QT Interval:  389 QTC Calculation: 455 R Axis:   18 Text Interpretation:  Sinus rhythm Nonspecific T abnormalities, lateral leads Baseline wander in lead(s) V3 No significant change since last tracing Confirmed by Dorie Rank [phone]) on 07/22/2018 9:46:11 AM   Radiology Dg Chest 2 View  Result Date: 07/22/2018 CLINICAL DATA:  Shortness of breath and bilateral leg swelling. EXAM: CHEST - 2 VIEW COMPARISON:  01/01/2017 FINDINGS: Cardiomediastinal silhouette is normal. Mediastinal contours appear intact. There is no evidence of focal airspace consolidation, pleural effusion or pneumothorax. Mild interstitial pulmonary edema. Osseous structures are without acute abnormality. Soft tissues are grossly normal. IMPRESSION: Mild interstitial pulmonary edema. Electronically Signed   By: Fidela Salisbury M.D.   On: 07/22/2018 09:31    Procedures Procedures (including critical care time)  Medications Ordered in ED Medications - No data to display   Initial Impression / Assessment and Plan / ED Course  I have reviewed the triage vital signs and the nursing notes.  Pertinent labs & imaging results that were available during my care of  the patient were reviewed by me and considered in my medical decision making (see chart for details).      Final Clinical Impressions(s) / ED Diagnoses   Final diagnoses:  Bilateral lower extremity edema   Pt presenting with BLE edema ongoing for 1 week. Has a h/o asthma and has intermittent SOB that is somewhat worse since the swelling began. No CP. Vital signs are reassuring.   Will obtain CXR, EKG, cbc, cmp, trop, and bnp.   CBC shows anemia which he appears to have a history of.  No leukocytosis. CMP with mild hypokalemia and slightly elevated bilirubin at 1.7. Troponin is negative BNP is normal  EKG is unchanged from previous.  Normal sinus rhythm. Chest x-ray shows mild pulmonary edema.  On exam patient does have evidence of peripheral edema however no significant evidence of fluid overload on her pulmonary evaluation.  She is not tachypneic and is speaking in full sentences.  She was able to ambulate in the ED and maintain sats at 94% without any tachypnea. On review of records pt does have a h/o grade 1 diastolic dysfunction and has required diuresis in the past. She is stable today and I feel she is appropriate for outpatient therapy at this time. Will dx with short course of lasix and have her f/u closely with pcp. Advised her to maintain log of weight and bring to pcp. Advised low salt diet. Advised to return of worse. She voices understanding of plan and reasons to return. All questions answered.   Discussed case with Dr. Tomi Bamberger who is in agreement with the plan.   ED Discharge Orders         Ordered    furosemide (LASIX) 40 MG tablet  Daily     07/22/18 1012  Bishop [place] 07/22/18 1208    Dorie Rank, MD 07/27/18 1141

## 2018-07-22 NOTE — Discharge Instructions (Signed)
Please take medications as prescribed. You will need to adhere to a low salt diet and check your weights daily. Keep documentation of your weights. Please follow up with your doctor in the next 3-5 days for reassessment and bring the log of your daily weights to the appointment.  If you have any new or worsening symptoms including shortness of breath, chest pain, worsening lower extremity swelling, or increasing weight then please return to the emergency department immediately

## 2018-07-22 NOTE — ED Notes (Signed)
Pt had an accident and cleaned up, pt given paper scrubs.

## 2019-01-06 ENCOUNTER — Emergency Department (HOSPITAL_COMMUNITY)
Admission: EM | Admit: 2019-01-06 | Discharge: 2019-01-06 | Disposition: A | Discharge status: Home / Self Care | Payer: Self-pay | Attending: Emergency Medicine | Admitting: Emergency Medicine

## 2019-01-06 ENCOUNTER — Other Ambulatory Visit: Payer: Self-pay

## 2019-01-06 DIAGNOSIS — R5383 Other fatigue: Secondary | ICD-10-CM | POA: Insufficient documentation

## 2019-01-06 DIAGNOSIS — I5030 Unspecified diastolic (congestive) heart failure: Secondary | ICD-10-CM | POA: Insufficient documentation

## 2019-01-06 DIAGNOSIS — I110 Hypertensive heart disease with heart failure: Secondary | ICD-10-CM | POA: Insufficient documentation

## 2019-01-06 DIAGNOSIS — R600 Localized edema: Secondary | ICD-10-CM | POA: Insufficient documentation

## 2019-01-06 DIAGNOSIS — J45909 Unspecified asthma, uncomplicated: Secondary | ICD-10-CM | POA: Insufficient documentation

## 2019-01-06 LAB — CBC
HCT: 38.5 % (ref 36.0–46.0)
Hemoglobin: 12.4 g/dL (ref 12.0–15.0)
MCH: 33.8 pg (ref 26.0–34.0)
MCHC: 32.2 g/dL (ref 30.0–36.0)
MCV: 104.9 fL — ABNORMAL HIGH (ref 80.0–100.0)
Platelets: 199 10*3/uL (ref 150–400)
RBC: 3.67 MIL/uL — ABNORMAL LOW (ref 3.87–5.11)
RDW: 17.9 % — ABNORMAL HIGH (ref 11.5–15.5)
WBC: 3.3 10*3/uL — ABNORMAL LOW (ref 4.0–10.5)
nRBC: 0 % (ref 0.0–0.2)

## 2019-01-06 LAB — COMPREHENSIVE METABOLIC PANEL
ALT: 15 U/L (ref 0–44)
AST: 28 U/L (ref 15–41)
Albumin: 3.7 g/dL (ref 3.5–5.0)
Alkaline Phosphatase: 82 U/L (ref 38–126)
Anion gap: 11 (ref 5–15)
BUN: 5 mg/dL — ABNORMAL LOW (ref 6–20)
CO2: 25 mmol/L (ref 22–32)
Calcium: 8.8 mg/dL — ABNORMAL LOW (ref 8.9–10.3)
Chloride: 100 mmol/L (ref 98–111)
Creatinine, Ser: 0.72 mg/dL (ref 0.44–1.00)
GFR calc Af Amer: >60 mL/min (ref >60)
GFR calc non Af Amer: >60 mL/min (ref >60)
Glucose, Bld: 92 mg/dL (ref 70–99)
Potassium: 3.8 mmol/L (ref 3.5–5.1)
Sodium: 136 mmol/L (ref 135–145)
Total Bilirubin: 0.8 mg/dL (ref 0.3–1.2)
Total Protein: 7.7 g/dL (ref 6.5–8.1)

## 2019-01-06 LAB — I-STAT BETA HCG BLOOD, ED (MC, WL, AP ONLY): I-stat hCG, quantitative: <5 m[IU]/mL (ref <5)

## 2019-01-06 LAB — LIPASE, BLOOD: Lipase: 34 U/L (ref 11–51)

## 2019-01-06 MED ORDER — FUROSEMIDE 20 MG PO TABS: 20.0000 mg | ORAL_TABLET | Freq: Every day | ORAL | 0 refills | Status: DC

## 2019-01-06 NOTE — ED Triage Notes (Signed)
Pt reports fatigue and diarrhea since beginning of August. Reports leg pain from fluid.

## 2019-01-06 NOTE — ED Provider Notes (Signed)
[facility] DEPT Provider Note   CSN: [id number] Arrival date & time: 01/06/19  1022    History   Chief Complaint Chief Complaint  Patient presents with  . Fatigue  . Diarrhea    HPI Caroline Harvey is a 58 y.o. female.     HPI   58yo female with a history of htn, alcohol abuse, hyponatremia presents with concern for diarrhea and fatigue.  Reports has been feeling fatigue since 8/1, today so generally weak was unable to perform at her job. Used to have good energy, going with energy, but now having severe generalized weakness.  Yesterday just sat on the couch all day because so week. This AM it continued.  Diarrhea for a few days, is really loose stool twice a day.. Had changed diet, chicken, spinach, beans.  Diarrhea twice per day.  No nausea or vomiting.  No abdominal pain.  Legs bilaterally are sore, swelling, previously was given lasix but not on it anymore. Needs to find PCP, was given rx in Feb for a few days. Taking the B12 OTC.  NO fevers, cough. Mild dyspnea with orthopnea, sleeps sitting up.   Past Medical History:  Diagnosis Date  . Alcohol abuse   . Anemia   . Hypertension   . Hyponatremia 09/2014    Patient Active Problem List   Diagnosis Date Noted  . Respiratory failure ([place]) 01/02/2017  . SOB (shortness of breath) 01/01/2017  . Asthma exacerbation 01/28/2016  . Bronchiolitis 01/27/2016  . Hyponatremia 10/03/2014  . Nausea vomiting and diarrhea 10/03/2014  . Leukopenia 10/03/2014  . Acute kidney injury ([place]) 10/03/2014  . 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 FRACTURE, ANKLE, RIGHT 07/05/2009  . GERD 02/07/2009  . BACK PAIN, LUMBAR 02/24/2008  . GOUT, UNSPECIFIED 11/25/2007  . Alcohol abuse 11/25/2007  . SUPERFICIAL THROMBOPHLEBITIS 03/18/2007  . METATARSALGIA 03/18/2007  . VARICOSE VEIN 02/18/2007  . Pain in limb 02/18/2007  . B12 DEFICIENCY 01/12/2007  . OBESITY NOS 01/12/2007  . HYPERTENSION, BENIGN 01/12/2007  . EDEMA LEG  01/12/2007    Past Surgical History:  Procedure Laterality Date  . ANKLE SURGERY Right    x  2     OB History    Gravida  1   Para  1   Term  1   Preterm      AB      Living  1     SAB      TAB      Ectopic      Multiple      Live Births               Home Medications    Prior to Admission medications   Medication Sig Start Date End Date Taking? Authorizing Provider  albuterol (PROVENTIL HFA;VENTOLIN HFA) 108 (90 Base) MCG/ACT inhaler Inhale 1-2 puffs into the lungs every 6 (six) hours as needed for wheezing or shortness of breath. 01/29/16  Yes Robbie Lis, MD  vitamin B-12 (CYANOCOBALAMIN) 1000 MCG tablet Take 1,000 mcg by mouth daily.   Yes [provider]  furosemide (LASIX) 20 MG tablet Take 1 tablet (20 mg total) by mouth daily for 5 days. 01/06/19 01/11/19  Gareth Morgan, MD    Family History Family History  Problem Relation Age of Onset  . Hypertension Mother   . Breast cancer Paternal Aunt        ? age of onset  . Breast cancer Paternal Aunt        ?  age of onset    Social History Social History   Tobacco Use  . Smoking status: Never Smoker  . Smokeless tobacco: Never Used  Substance Use Topics  . Alcohol use: Yes    Comment: Quit May 20 2015  . Drug use: No     Allergies   Patient has no known allergies.   Review of Systems Review of Systems  Constitutional: Positive for fatigue. Negative for appetite change and fever.  Respiratory: Positive for shortness of breath.   Cardiovascular: Positive for leg swelling. Negative for chest pain.  Gastrointestinal: Positive for diarrhea. Negative for abdominal pain, nausea and vomiting.  Genitourinary: Negative for dysuria.  Skin: Negative for rash.  Neurological: Negative for light-headedness.     Physical Exam Updated Vital Signs BP (!) 138/58   Pulse 100   Temp 99.5 F (37.5 C) (Oral)   Resp 16   LMP 12/14/2010   SpO2 100%   Physical Exam Vitals signs and  nursing note reviewed.  Constitutional:      General: She is not in acute distress.    Appearance: She is well-developed. She is not diaphoretic.  HENT:     Head: Normocephalic and atraumatic.  Eyes:     Conjunctiva/sclera: Conjunctivae normal.  Neck:     Musculoskeletal: Normal range of motion.  Cardiovascular:     Rate and Rhythm: Normal rate and regular rhythm.     Heart sounds: Normal heart sounds. No murmur. No friction rub. No gallop.   Pulmonary:     Effort: Pulmonary effort is normal. No respiratory distress.     Breath sounds: Normal breath sounds. No wheezing or rales.  Abdominal:     General: There is no distension.     Palpations: Abdomen is soft.     Tenderness: There is no abdominal tenderness. There is no guarding.  Musculoskeletal:        General: No tenderness.     Right lower leg: Edema present.     Left lower leg: Edema present.  Skin:    General: Skin is warm and dry.     Findings: No erythema or rash.  Neurological:     Mental Status: She is alert and oriented to person, place, and time.      ED Treatments / Results  Labs (all labs ordered are listed, but only abnormal results are displayed) Labs Reviewed  COMPREHENSIVE METABOLIC PANEL - Abnormal; Notable for the following components:      Result Value   BUN 5 (*)    Calcium 8.8 (*)    All other components within normal limits  CBC - Abnormal; Notable for the following components:   WBC 3.3 (*)    RBC 3.67 (*)    MCV 104.9 (*)    RDW 17.9 (*)    All other components within normal limits  LIPASE, BLOOD  I-STAT BETA HCG BLOOD, ED ([facility], [facility], [facility] ONLY)    EKG None  Radiology No results found.  Procedures Procedures (including critical care time)  Medications Ordered in ED Medications - No data to display   Initial Impression / Assessment and Plan / ED Course  I have reviewed the triage vital signs and the nursing notes.  Pertinent labs & imaging results that were available during my  care of the patient were reviewed by me and considered in my medical decision making (see chart for details).        58yo female with a history of htn, alcohol abuse, hyponatremia,  grade 1 diastolic dysfunction, presents with concern for fatigue, leg swelling, loose stool.  Labs without significant anemia or electrolyte abnormalities.  Reports 2 loose stool per day, possible viral etiology.  Does report worsening leg swelling as well as orthopnea. No hypoxia, tachypnea, do not feel emergent admission or eval for worsening CHF indicated, however given worsening leg swelling and symptoms reasonable to place patient back on lasix. No chest pain or acute dyspnea.  Placed consult with case management to help arrange PCP follow up as patient will require further outpt evaluation including TSH, repeat ECHO. Given days of lasix. Patient discharged in stable condition with understanding of reasons to return.   Final Clinical Impressions(s) / ED Diagnoses   Final diagnoses:  Other fatigue  Bilateral leg edema    ED Discharge Orders         Ordered    furosemide (LASIX) 20 MG tablet  Daily     01/06/19 1711           Gareth Morgan, MD 01/07/19 1207

## 2019-01-12 ENCOUNTER — Other Ambulatory Visit: Payer: Self-pay

## 2019-01-12 DIAGNOSIS — Z20822 Contact with and (suspected) exposure to covid-19: Secondary | ICD-10-CM

## 2019-01-13 LAB — SPECIMEN STATUS REPORT

## 2019-01-13 LAB — NOVEL CORONAVIRUS, NAA: SARS-CoV-2, NAA: NOT DETECTED

## 2019-07-28 ENCOUNTER — Ambulatory Visit (HOSPITAL_COMMUNITY)
Admission: AD | Admit: 2019-07-28 | Discharge: 2019-07-28 | Disposition: A | Discharge status: Home / Self Care | Payer: Self-pay | Attending: Psychiatry | Admitting: Psychiatry

## 2019-07-28 DIAGNOSIS — F1994 Other psychoactive substance use, unspecified with psychoactive substance-induced mood disorder: Secondary | ICD-10-CM | POA: Insufficient documentation

## 2019-07-29 ENCOUNTER — Other Ambulatory Visit: Payer: Self-pay

## 2019-07-29 ENCOUNTER — Encounter (HOSPITAL_COMMUNITY): Payer: Self-pay | Admitting: Psychiatry

## 2019-07-29 ENCOUNTER — Inpatient Hospital Stay (HOSPITAL_COMMUNITY)
Admission: RE | Admit: 2019-07-29 | Discharge: 2019-08-03 | DRG: 897 | Disposition: A | Discharge status: Home / Self Care | Payer: Federal, State, Local not specified - Other | Attending: Psychiatry | Admitting: Psychiatry

## 2019-07-29 DIAGNOSIS — R45851 Suicidal ideations: Secondary | ICD-10-CM | POA: Diagnosis present

## 2019-07-29 DIAGNOSIS — Z818 Family history of other mental and behavioral disorders: Secondary | ICD-10-CM

## 2019-07-29 DIAGNOSIS — Z915 Personal history of self-harm: Secondary | ICD-10-CM | POA: Diagnosis not present

## 2019-07-29 DIAGNOSIS — Z20822 Contact with and (suspected) exposure to covid-19: Secondary | ICD-10-CM | POA: Diagnosis present

## 2019-07-29 DIAGNOSIS — I10 Essential (primary) hypertension: Secondary | ICD-10-CM | POA: Diagnosis present

## 2019-07-29 DIAGNOSIS — F1120 Opioid dependence, uncomplicated: Secondary | ICD-10-CM | POA: Diagnosis present

## 2019-07-29 DIAGNOSIS — Z8249 Family history of ischemic heart disease and other diseases of the circulatory system: Secondary | ICD-10-CM

## 2019-07-29 DIAGNOSIS — F1410 Cocaine abuse, uncomplicated: Secondary | ICD-10-CM | POA: Diagnosis present

## 2019-07-29 DIAGNOSIS — Y900 Blood alcohol level of less than 20 mg/100 ml: Secondary | ICD-10-CM | POA: Diagnosis present

## 2019-07-29 DIAGNOSIS — G479 Sleep disorder, unspecified: Secondary | ICD-10-CM | POA: Diagnosis present

## 2019-07-29 DIAGNOSIS — F1994 Other psychoactive substance use, unspecified with psychoactive substance-induced mood disorder: Principal | ICD-10-CM | POA: Diagnosis present

## 2019-07-29 DIAGNOSIS — F10239 Alcohol dependence with withdrawal, unspecified: Secondary | ICD-10-CM | POA: Diagnosis present

## 2019-07-29 LAB — LIPID PANEL
Cholesterol: 161 mg/dL (ref 0–200)
HDL: 96 mg/dL (ref >40)
LDL Cholesterol: 59 mg/dL (ref 0–99)
Total CHOL/HDL Ratio: 1.7 RATIO
Triglycerides: 31 mg/dL (ref <150)
VLDL: 6 mg/dL (ref 0–40)

## 2019-07-29 LAB — COMPREHENSIVE METABOLIC PANEL
ALT: 10 U/L (ref 0–44)
AST: 18 U/L (ref 15–41)
Albumin: 3.5 g/dL (ref 3.5–5.0)
Alkaline Phosphatase: 78 U/L (ref 38–126)
Anion gap: 8 (ref 5–15)
BUN: 7 mg/dL (ref 6–20)
CO2: 28 mmol/L (ref 22–32)
Calcium: 9.1 mg/dL (ref 8.9–10.3)
Chloride: 97 mmol/L — ABNORMAL LOW (ref 98–111)
Creatinine, Ser: 0.7 mg/dL (ref 0.44–1.00)
GFR calc Af Amer: >60 mL/min (ref >60)
GFR calc non Af Amer: >60 mL/min (ref >60)
Glucose, Bld: 93 mg/dL (ref 70–99)
Potassium: 4 mmol/L (ref 3.5–5.1)
Sodium: 133 mmol/L — ABNORMAL LOW (ref 135–145)
Total Bilirubin: 0.4 mg/dL (ref 0.3–1.2)
Total Protein: 7.2 g/dL (ref 6.5–8.1)

## 2019-07-29 LAB — HEPATIC FUNCTION PANEL
ALT: 11 U/L (ref 0–44)
AST: 18 U/L (ref 15–41)
Albumin: 3.6 g/dL (ref 3.5–5.0)
Alkaline Phosphatase: 79 U/L (ref 38–126)
Bilirubin, Direct: 0.1 mg/dL (ref 0.0–0.2)
Indirect Bilirubin: 0.5 mg/dL (ref 0.3–0.9)
Total Bilirubin: 0.6 mg/dL (ref 0.3–1.2)
Total Protein: 7.2 g/dL (ref 6.5–8.1)

## 2019-07-29 LAB — TSH: TSH: 1.632 u[IU]/mL (ref 0.350–4.500)

## 2019-07-29 LAB — HEMOGLOBIN A1C
Hgb A1c MFr Bld: 5 % (ref 4.8–5.6)
Mean Plasma Glucose: 96.8 mg/dL

## 2019-07-29 LAB — RESPIRATORY PANEL BY RT PCR (FLU A&B, COVID)
Influenza A by PCR: NEGATIVE
Influenza B by PCR: NEGATIVE
SARS Coronavirus 2 by RT PCR: NEGATIVE

## 2019-07-29 LAB — MAGNESIUM: Magnesium: 2.2 mg/dL (ref 1.7–2.4)

## 2019-07-29 LAB — ETHANOL: Alcohol, Ethyl (B): <10 mg/dL (ref <10)

## 2019-07-29 MED ORDER — CHLORDIAZEPOXIDE HCL 25 MG PO CAPS
25.0000 mg | ORAL_CAPSULE | Freq: Four times a day (QID) | ORAL | Status: AC
  Administered 2019-07-29 – 2019-07-30 (×3): 25 mg via ORAL
  Filled 2019-07-29 (×3): qty 1

## 2019-07-29 MED ORDER — HYDROXYZINE HCL 25 MG PO TABS
25.0000 mg | ORAL_TABLET | Freq: Four times a day (QID) | ORAL | Status: AC | PRN
  Administered 2019-07-29 – 2019-07-31 (×3): 25 mg via ORAL
  Filled 2019-07-29 (×3): qty 1

## 2019-07-29 MED ORDER — ADULT MULTIVITAMIN W/MINERALS CH
1.0000 | ORAL_TABLET | Freq: Every day | ORAL | Status: DC
  Administered 2019-07-29 – 2019-08-03 (×6): 1 via ORAL
  Filled 2019-07-29 (×8): qty 1

## 2019-07-29 MED ORDER — ACETAMINOPHEN 325 MG PO TABS
650.0000 mg | ORAL_TABLET | Freq: Four times a day (QID) | ORAL | Status: DC | PRN
  Administered 2019-07-29 – 2019-08-02 (×5): 650 mg via ORAL
  Filled 2019-07-29 (×5): qty 2

## 2019-07-29 MED ORDER — CHLORDIAZEPOXIDE HCL 25 MG PO CAPS
25.0000 mg | ORAL_CAPSULE | Freq: Four times a day (QID) | ORAL | Status: AC | PRN
  Administered 2019-07-31 (×2): 25 mg via ORAL
  Filled 2019-07-29 (×3): qty 1

## 2019-07-29 MED ORDER — CHLORDIAZEPOXIDE HCL 25 MG PO CAPS
25.0000 mg | ORAL_CAPSULE | Freq: Three times a day (TID) | ORAL | Status: AC
  Administered 2019-07-30 – 2019-07-31 (×3): 25 mg via ORAL
  Filled 2019-07-29 (×2): qty 1

## 2019-07-29 MED ORDER — THIAMINE HCL 100 MG/ML IJ SOLN
100.0000 mg | Freq: Once | INTRAMUSCULAR | Status: AC
  Administered 2019-07-29: 100 mg via INTRAMUSCULAR
  Filled 2019-07-29: qty 2

## 2019-07-29 MED ORDER — THIAMINE HCL 100 MG PO TABS
100.0000 mg | ORAL_TABLET | Freq: Every day | ORAL | Status: DC
  Administered 2019-07-30 – 2019-08-03 (×5): 100 mg via ORAL
  Filled 2019-07-29 (×7): qty 1

## 2019-07-29 MED ORDER — CHLORDIAZEPOXIDE HCL 25 MG PO CAPS
25.0000 mg | ORAL_CAPSULE | ORAL | Status: AC
  Administered 2019-07-31 – 2019-08-01 (×2): 25 mg via ORAL
  Filled 2019-07-29 (×2): qty 1

## 2019-07-29 MED ORDER — CHLORDIAZEPOXIDE HCL 25 MG PO CAPS: 25.0000 mg | ORAL_CAPSULE | Freq: Every day | ORAL | Status: DC

## 2019-07-29 NOTE — Progress Notes (Signed)
Caroline Harvey is a 59 y.o. female Voluntary admitted for suicide thoughts with the plan to overdose on her pills. Pt stated she is not happy with how she uses drugs and alcohol. Pt stated she does not love herself, so do her mom and sister. Pt has not been eating well, has not been sleeping well. Pt appeared depressed and reported AVH but denied SI/HI. Pt alert and oriented, consents signed, skin/belongings search completed and pt oriented to unit. Pt stable at this time. Pt given the opportunity to express concerns and ask questions. Pt given toiletries. Will continue to monitor.

## 2019-07-29 NOTE — BHH Suicide Risk Assessment (Signed)
[facility] Admission Suicide Risk Assessment   Nursing information obtained from:  Patient Demographic factors:  Gay, lesbian, or bisexual orientation Current Mental Status:  NA Loss Factors:  Loss of significant relationship, Decline in physical health Historical Factors:  Family history of mental illness or substance abuse Risk Reduction Factors:  Employed  Total Time spent with patient: 45 minutes Principal Problem: Mood disorder, drug-induced (HCC) Diagnosis:  Principal Problem:   Mood disorder, drug-induced ([place]) Active Problems:   Substance induced mood disorder (HCC)  Subjective Data: Ms. 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  She will be admitted voluntarily.  Continued Clinical Symptoms:  Alcohol Use Disorder Identification Test Final Score (AUDIT): 35 The "Alcohol Use Disorders Identification Test", Guidelines for Use in Primary Care, Second Edition.  World Pharmacologist [facility]). Score between 0-7:  no or low risk or alcohol related problems. Score between 8-15:  moderate risk of alcohol related problems. Score between 16-19:  high risk of alcohol related problems. Score 20 or above:  warrants further diagnostic evaluation for alcohol dependence and treatment.   CLINICAL FACTORS:   Alcohol/Substance Abuse/Dependencies   Musculoskeletal: Strength & Muscle Tone: within normal limits Gait & Station: normal Patient leans: N/A  Psychiatric Specialty Exam: Physical Exam  Review of Systems  Blood pressure 128/62, pulse 69, temperature 98.3 F (36.8 C), temperature source Oral, resp. rate 18, last menstrual period 12/14/2010, SpO2 100 %.There is no height or weight on file  to calculate BMI.  General Appearance: Disheveled  Eye Contact:  Poor  Speech:  Slow  Volume:  Decreased  Mood:  Anxious and Depressed  Affect:  Blunt  Thought Process:  Goal Directed and Descriptions of Associations: Circumstantial  Orientation:  Full (Time, Place, and Person)  Thought Content:  Logical  Suicidal Thoughts:  Yes.  without intent/plan  Homicidal Thoughts:  No  Memory:  Immediate;   Good Recent;   Good Remote;   Good  Judgement:  Good  Insight:  NA and Good  Psychomotor Activity:  Normal  Concentration:  Concentration: Fair and Attention Span: Fair  Recall:  [employer] of Knowledge:  Fair  Language:  Fair  Akathisia:  Negative  Handed:  Right  AIMS (if indicated):     Assets:  Communication Skills Desire for Improvement Financial Resources/Insurance Housing Leisure Time Physical Health Resilience Social Support Talents/Skills Transportation Vocational/Educational  ADL's:  Intact  Cognition:  WNL  Sleep:         COGNITIVE FEATURES THAT CONTRIBUTE TO RISK:  None    SUICIDE RISK:   Mild:  Suicidal ideation of limited frequency, intensity, duration, and specificity.  There are no identifiable plans, no associated intent, mild dysphoria and related symptoms, good self-control (both objective and subjective assessment), few other risk factors, and identifiable protective factors, including available and accessible social support.  PLAN OF CARE: Admission for stabilization  I certify that inpatient services furnished can reasonably be expected to improve the patient's condition.   Johnn Hai, MD 07/29/2019, 3:00 PM

## 2019-07-29 NOTE — BHH Group Notes (Signed)
Adult Psychoeducational Group Note  Date:  07/29/2019 Time:  9:28 PM  Group Topic/Focus:  Wrap-Up Group:   The focus of this group is to help patients review their daily goal of treatment and discuss progress on daily workbooks.  Participation Level:  Minimal  Participation Quality:  Appropriate and Attentive  Affect:  Flat  Cognitive:  Alert and Appropriate  Insight: Appropriate and Good  Engagement in Group:  Engaged  Modes of Intervention:  Discussion and Education  Additional Comments:  Pt attended and participated in wrap up group this evening and did not have a rating for their day. Pt rested today before she was moved from obs unit. Pt is feeling relaxed since coming to adult unit. Pt goal while they are here is to stay away from the things that she has been doing and to stop making bad decisions concerning drugs and alcohol. Pt states that they have a great support system at home.   JANESSA WUERTZ 07/29/2019, 9:28 PM

## 2019-07-29 NOTE — BH Assessment (Signed)
[employer] Assessment Progress Note  Per Johnn Hai, MD, this voluntary pt requires psychiatric hospitalization at this time.  Jasmine has assigned pt to [facility] Rm [id number].  Pt's nurse has been notified.  Jalene Mullet, [place] Coordinator [phone]

## 2019-07-29 NOTE — H&P (Signed)
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 Pt states "I don't love me anymore because I'm doing things that I shouldn't be doing" Pt reports that she has used about $100 worth of heroin and drank between 6-9 240z beers including some wine. Pt reports she started abusing substances last year summer due to family conflicts, stress and low self esteem. Pt endorses suicidal ideation with plans to overdose on gabapentin which she can get on the streets due to not being loved enough by others. Pt reports symptoms of depression to include anxiety, hopelessness, helplessness, irritability and worthlessness. Pt reports history of suicidal attempts. She denies HI, and self harm. Pt states she sees something moving on the wall. Pt denies access to guns but access to knives. She denies any history of abuse or trauma. She reports family history of mental illness. Pt reports she was at a [facility] for rehab services 3 years ago but relapsed after being 2 years sober due to her sponsor moving out of state. Pt reports she sees no therapist, psychiatrist and takes no psychiatric medication. Pt denies any history of violence or pending legal charges. She states she sleeps 5 hours currently but her normal is 8 hours. She states her apetite is fair.   During evaluation pt is sitting; she is alert/oriented x 4; cooperative; and mood is depressed/anxious congruent with affect. Pt is speaking in a clear tone at moderate volume, and normal pace; with poor eye contact. Her thought process is coherent and relevant; There is no indication  that she is currently responding to internal/external stimuli or experiencing delusional thought content. Patient was tearful throughout assessment and has answered questions appropriately.    Associated Signs/Symptoms: Depression Symptoms:  depressed mood, feelings of worthlessness/guilt, hopelessness, suicidal thoughts with specific plan, anxiety, disturbed sleep, (Hypo) Manic Symptoms:  Hallucinations, Irritable Mood, Anxiety Symptoms:  Excessive Worry, Psychotic Symptoms:  Hallucinations: Visual PTSD Symptoms: NA Total Time spent with patient: 30 minutes  Past Psychiatric History: No  Is the patient at risk to self? Yes.    Has the patient been a risk to self in the past 6 months? No.  Has the patient been a risk to self within the distant past? No.  Is the patient a risk to others? No.  Has the patient been a risk to others in the past 6 months? No.  Has the patient been a risk to others within the distant past? No.   Prior Inpatient Therapy:   Prior Outpatient Therapy:    Alcohol Screening: 1. How often do you have a drink containing alcohol?: 4 or more times a week 2. How many drinks containing alcohol do you have on a typical day when you are drinking?: 7, 8, or 9 3. How often do you have six or more drinks on one occasion?: Weekly AUDIT-C Score: 10 4. How often during the last year have you found that you were not able to stop drinking once you had started?: Daily or almost daily 5. How often during the last year have you failed to do what was normally expected from you becasue of drinking?: Daily or almost daily 6. How  often during the last year have you needed a first drink in the morning to get yourself going after a heavy drinking session?: Daily or almost daily 7. How often during the last year have you had a feeling of guilt of remorse after drinking?: Weekly 8. How often during the last year have you been unable to remember what happened the night before because  you had been drinking?: Daily or almost daily 9. Have you or someone else been injured as a result of your drinking?: Yes, but not in the last year 10. Has a relative or friend or a doctor or another health worker been concerned about your drinking or suggested you cut down?: Yes, during the last year Alcohol Use Disorder Identification Test Final Score (AUDIT): 35 Substance Abuse History in the last 12 months:  Yes.   Consequences of Substance Abuse: Family Consequences:  Lack of finances Withdrawal Symptoms:   Diaphoresis Tremors Previous Psychotropic Medications: No  Psychological Evaluations: Yes  Past Medical History:  Past Medical History:  Diagnosis Date  . Alcohol abuse   . Anemia   . Hypertension   . Hyponatremia 09/2014    Past Surgical History:  Procedure Laterality Date  . ANKLE SURGERY Right    x  2   Family History:  Family History  Problem Relation Age of Onset  . Hypertension Mother   . Breast cancer Paternal Aunt        ? age of onset  . Breast cancer Paternal Aunt        ? age of onset   Family Psychiatric History: Unknown Tobacco Screening:   Social History:  Social History   Substance and Sexual Activity  Alcohol Use Yes   Comment: Quit May 20 2015     Social History   Substance and Sexual Activity  Drug Use No    Additional Social History:      Pain Medications: See MAR Prescriptions: See MAR Over the Counter: See MAR History of alcohol / drug use?: Yes Longest period of sobriety (when/how long): 2 months Negative Consequences of Use: Personal relationships Withdrawal Symptoms: Agitation, Irritability                    Allergies:  No Known Allergies Lab Results:  Results for orders placed or performed during the hospital encounter of 07/29/19 (from the past 48 hour(s))  Respiratory Panel by RT PCR (Flu A&B, Covid) - Nasopharyngeal Swab     Status: None   Collection Time: 07/29/19  1:05 AM   Specimen: Nasopharyngeal Swab   Result Value Ref Range   SARS Coronavirus 2 by RT PCR NEGATIVE NEGATIVE    Comment: (NOTE) SARS-CoV-2 target nucleic acids are NOT DETECTED. The SARS-CoV-2 RNA is generally detectable in upper respiratoy specimens during the acute phase of infection. The lowest concentration of SARS-CoV-2 viral copies this assay can detect is 131 copies/mL. A negative result does not preclude SARS-Cov-2 infection and should not be used as the sole basis for treatment or other patient management decisions. A negative result may occur with  improper specimen collection/handling, submission of specimen other than nasopharyngeal swab, presence of viral mutation(s) within the areas targeted by this assay, and inadequate number of viral copies (<131 copies/mL). A negative result must be combined with clinical observations, patient history, and epidemiological information. The expected result is Negative. Fact Sheet for Patients:  PinkCheek.be Fact Sheet for Healthcare Providers:  GravelBags.it This test is not yet ap proved or cleared by  the [employer] and  has been authorized for detection and/or diagnosis of SARS-CoV-2 by FDA under an Emergency Use Authorization (EUA). This EUA will remain  in effect (meaning this test can be used) for the duration of the COVID-19 declaration under Section 564(b)(1) of the Act, 21 U.S.C. section 360bbb-3(b)(1), unless the authorization is terminated or revoked sooner.    Influenza A by PCR NEGATIVE NEGATIVE   Influenza B by PCR NEGATIVE NEGATIVE    Comment: (NOTE) The Xpert Xpress SARS-CoV-2/FLU/RSV assay is intended as an aid in  the diagnosis of influenza from Nasopharyngeal swab specimens and  should not be used as a sole basis for treatment. Nasal washings and  aspirates are unacceptable for Xpert Xpress SARS-CoV-2/FLU/RSV  testing. Fact Sheet for  Patients: PinkCheek.be Fact Sheet for Healthcare Providers: GravelBags.it This test is not yet approved or cleared by the [place] FDA and  has been authorized for detection and/or diagnosis of SARS-CoV-2 by  FDA under an Emergency Use Authorization (EUA). This EUA will remain  in effect (meaning this test can be used) for the duration of the  Covid-19 declaration under Section 564(b)(1) of the Act, 21  U.S.C. section 360bbb-3(b)(1), unless the authorization is  terminated or revoked. Performed at [facility], [address] Capo [address] [place], [place] [address]     Blood Alcohol level:  Lab Results  Component Value Date   ETH <5 10/03/2014   ETH (H) 05/21/2009    109        LOWEST DETECTABLE LIMIT FOR SERUM ALCOHOL IS 5 mg/dL FOR MEDICAL PURPOSES ONLY    Metabolic Disorder Labs:  No results found for: HGBA1C, MPG No results found for: PROLACTIN Lab Results  Component Value Date   CHOL 151 01/18/2008   TRIG 229 (H) 01/18/2008   HDL 74 01/18/2008   CHOLHDL 2.0 Ratio 01/18/2008   VLDL 46 (H) 01/18/2008   LDLCALC 31 01/18/2008   LDLCALC 67 02/18/2007    Current Medications: No current facility-administered medications for this encounter.   PTA Medications: Medications Prior to Admission  Medication Sig Dispense Refill Last Dose  . albuterol (PROVENTIL HFA;VENTOLIN HFA) 108 (90 Base) MCG/ACT inhaler Inhale 1-2 puffs into the lungs every 6 (six) hours as needed for wheezing or shortness of breath. 1 Inhaler 1   . furosemide (LASIX) 20 MG tablet Take 1 tablet (20 mg total) by mouth daily for 5 days. 5 tablet 0   . vitamin B-12 (CYANOCOBALAMIN) 1000 MCG tablet Take 1,000 mcg by mouth daily.       Musculoskeletal: Strength & Muscle Tone: within normal limits Gait & Station: normal Patient leans: Right  Psychiatric Specialty Exam: Physical Exam  Constitutional: She is oriented to person, place,  and time. She appears well-developed and well-nourished.  HENT:  Head: Normocephalic.  Eyes: Pupils are equal, round, and reactive to light.  Respiratory: Effort normal.  Musculoskeletal:        General: Normal range of motion.     Cervical back: Normal range of motion.  Neurological: She is alert and oriented to person, place, and time.  Skin: Skin is warm and dry.  Psychiatric: Her speech is normal and behavior is normal. Judgment normal. Her mood appears anxious. Cognition and memory are normal. She exhibits a depressed mood. She expresses suicidal ideation. Plan of suicide: overdose on pills.    Review of Systems  Psychiatric/Behavioral: Positive for dysphoric mood, hallucinations, sleep disturbance and suicidal ideas. Negative for agitation, behavioral problems, confusion and decreased  concentration. The patient is nervous/anxious.   All other systems reviewed and are negative.   Blood pressure 125/70, pulse 82, temperature 98.2 F (36.8 C), temperature source Oral, resp. rate 18, last menstrual period 12/14/2010, SpO2 99 %.There is no height or weight on file to calculate BMI.  General Appearance: Casual  Eye Contact:  Poor  Speech:  Normal Rate  Volume:  Normal  Mood:  Anxious, Depressed, Hopeless and Worthless  Affect:  Congruent  Thought Process:  Coherent and Descriptions of Associations: Intact  Orientation:  Full (Time, Place, and Person)  Thought Content:  WDL  Suicidal Thoughts:  Yes.  with intent/plan  Homicidal Thoughts:  No  Memory:  Recent;   Good  Judgement:  Fair  Insight:  Fair  Psychomotor Activity:  Normal  Concentration:  Concentration: Good  Recall:  Good  Fund of Knowledge:  Good  Language:  Good  Akathisia:  No  Handed:  Right  AIMS (if indicated):     Assets:  Communication Skills Desire for Improvement Financial Resources/Insurance Housing Vocational/Educational  ADL's:  Intact  Cognition:  WNL  Sleep:       Disposition: Recommend  overnight observation and stabilization. Supportive therapy provided about ongoing stressors.  Treatment Plan Summary: Daily contact with patient to assess and evaluate symptoms and progress in treatment and Medication management  Observation Level/Precautions:  15 minute checks Laboratory:  Chemistry Profile UDS Psychotherapy:   Medications:   Consultations:   Discharge Concerns:   Estimated LOS: Other:      Mliss Fritz, NP 3/4/20212:22 AM

## 2019-07-29 NOTE — Progress Notes (Signed)
   07/29/19 2100  Psych Admission Type (Psych Patients Only)  Admission Status Voluntary  Psychosocial Assessment  Patient Complaints Anxiety;Depression  Eye Contact Avoids  Facial Expression Pained;Sad  Affect Depressed  Speech Logical/coherent  Interaction Assertive  Motor Activity Shuffling  Appearance/Hygiene Unremarkable  Behavior Characteristics Cooperative  Mood Depressed;Anxious  Aggressive Behavior  Targets Self  Type of Behavior Weapon  Effect No apparent injury  Thought Process  Coherency WDL  Content Blaming self;Blaming others  Delusions Paranoid  Perception WDL  Hallucination Auditory;Olfactory  Judgment Poor  Confusion None  Danger to Self  Current suicidal ideation? Denies  Danger to Others  Danger to Others None reported or observed

## 2019-07-29 NOTE — BH Assessment (Signed)
Assessment Note  Caroline Harvey is an 59 y.o. female. Pt presents to [facility] as a walk-in accompanied by a friend for suicidal thoughts and opiate use. Pt states, " I dont love me anymore, I been doing things I shouldn't, drugs". Pt states that she drinks everyday between 6 to 9 24 oz beers as well as wine. Pt states she drank 3 bottles of wine and multiple beers in the last 24 hours. Pt also states she engaged in using heroin, a large amount but unsure exactly how much. Pt reports current passive SI with thoughts of not being loved enough by others and wanting to overdose on pills specifically Gabapentin . Pt states she does not take medication, but can get the pills from others she knows. Pt reports no previous SI attempts. Pt currently denies HI, AVH, SIB but reports she started using drugs last year due to stress, family issues and conflict, low self-esteem. Pt states she feels her family does not support or love her and treats her differently. Pt reports no history of abuse/trauma but reports family history of substance abuse and mental health issues. Pt reports getting 5 hours of sleep which decreased from 8 hours since last year and a poor appetite. Pt reports symptoms of depression: worthlessness, hopelessness, isolation, anxiety, irritability. Pt reports no history of violence but access to weapons (knives) in her home. Pt reports she has not been psychiatrically  Hospitalized but has been to rehab, went to [facility] 3 years ago and was sober for 2 years afterwards. Pt reports no providers and no medications past or present. Pt presents very tearful, depressed during assessment as well as intoxicated from substances. Pt states she is willing to go back to rehab but can not contract for safety at this time.  Diagnosis: Substance-induced depressive disorder  Past Medical History:  Past Medical History:  Diagnosis Date  . Alcohol abuse   . Anemia   . Hypertension   . Hyponatremia 09/2014    Past Surgical  History:  Procedure Laterality Date  . ANKLE SURGERY Right    x  2    Family History:  Family History  Problem Relation Age of Onset  . Hypertension Mother   . Breast cancer Paternal Aunt        ? age of onset  . Breast cancer Paternal Aunt        ? age of onset    Social History:  reports that she has never smoked. She has never used smokeless tobacco. She reports current alcohol use. She reports that she does not use drugs.  Additional Social History:  Alcohol / Drug Use Pain Medications: see MAR Prescriptions: see MAR Over the Counter: see MAR History of alcohol / drug use?: Yes Substance #1 Name of Substance 1: heroin  CIWA:   COWS:    Allergies: No Known Allergies  Home Medications: (Not in a hospital admission)   OB/GYN Status:  Patient's last menstrual period was 12/14/2010.  General Assessment Data Location of Assessment: [facility] Assessment Services TTS Assessment: In system Is this a Tele or Face-to-Face Assessment?: Face-to-Face Is this an Initial Assessment or a Re-assessment for this encounter?: Initial Assessment Patient Accompanied by:: N/A Language Other than English: No Living Arrangements: Other (Comment) What gender do you identify as?: Female Pregnancy Status: No Living Arrangements: Alone Can pt return to current living arrangement?: Yes Admission Status: Voluntary Is patient capable of signing voluntary admission?: Yes Referral Source: Self/Family/Friend     Crisis Care Plan Living  Arrangements: Alone Legal Guardian: Other:(self) Name of Psychiatrist: none Name of Therapist: none  Education Status Is patient currently in school?: No Is the patient employed, unemployed or receiving disability?: Employed  Risk to self with the past 6 months Suicidal Ideation: Yes-Currently Present Has patient been a risk to self within the past 6 months prior to admission? : Yes Suicidal Intent: Yes-Currently Present Has patient had any suicidal  intent within the past 6 months prior to admission? : No Is patient at risk for suicide?: No Suicidal Plan?: Yes-Currently Present Has patient had any suicidal plan within the past 6 months prior to admission? : No Specify Current Suicidal Plan: pt wants to overdose on pills Access to Means: Yes Specify Access to Suicidal Means: access to people with pills What has been your use of drugs/alcohol within the last 12 months?: cocaine/heroin Previous Attempts/Gestures: No How many times?: 0 Triggers for Past Attempts: Unknown Intentional Self Injurious Behavior: None Family Suicide History: No Recent stressful life event(s): Conflict (Comment), Other (Comment) Persecutory voices/beliefs?: No Depression: Yes Depression Symptoms: Feeling angry/irritable, Isolating, Tearfulness, Insomnia, Despondent, Feeling worthless/self pity Substance abuse history and/or treatment for substance abuse?: Yes Suicide prevention information given to non-admitted patients: Not applicable  Risk to Others within the past 6 months Homicidal Ideation: No Does patient have any lifetime risk of violence toward others beyond the six months prior to admission? : No Thoughts of Harm to Others: No Current Homicidal Intent: No Current Homicidal Plan: No Access to Homicidal Means: No Identified Victim: none History of harm to others?: No Assessment of Violence: None Noted Violent Behavior Description: none Does patient have access to weapons?: No Criminal Charges Pending?: No Does patient have a court date: No Is patient on probation?: No  Psychosis Hallucinations: None noted Delusions: None noted  Mental Status Report Appearance/Hygiene: Disheveled Eye Contact: Fair Motor Activity: Freedom of movement Speech: Logical/coherent, Slurred Level of Consciousness: Restless Mood: Depressed, Worthless, low self-esteem Affect: Depressed, Appropriate to circumstance, Sullen Anxiety Level: None Thought Processes:  Coherent Judgement: Impaired Orientation: Person, Situation, Time Obsessive Compulsive Thoughts/Behaviors: None  Cognitive Functioning Concentration: Fair Memory: Recent Intact Is patient IDD: No Insight: Fair Impulse Control: Poor Appetite: Poor Have you had any weight changes? : No Change Sleep: Decreased Total Hours of Sleep: 5 Vegetative Symptoms: Decreased grooming  ADLScreening [facility] Assessment Services) Patient's cognitive ability adequate to safely complete daily activities?: Yes Patient able to express need for assistance with ADLs?: Yes Independently performs ADLs?: Yes (appropriate for developmental age)  Prior Inpatient Therapy Prior Inpatient Therapy: No  Prior Outpatient Therapy Prior Outpatient Therapy: No Does patient have an ACCT team?: No Does patient have Intensive In-House Services?  : No Does patient have [facility] services? : No Does patient have P4CC services?: No  ADL Screening (condition at time of admission) Patient's cognitive ability adequate to safely complete daily activities?: Yes Patient able to express need for assistance with ADLs?: Yes Independently performs ADLs?: Yes (appropriate for developmental age)            Disposition: Adaku, Anike, FNP recommends overnight observation, reassess in the morning.  Disposition Initial Assessment Completed for this Encounter: Yes  On Site Evaluation by:  Antony Contras, [place] with Physician:  Loa Socks Seda Kronberg 07/29/2019 12:47 AM

## 2019-07-29 NOTE — Plan of Care (Signed)
[facility] Crisis Plan  Reason for Crisis Plan:  Substance Abuse   Plan of Care:  Referral for Substance Abuse  Family Support:      Current Living Environment:  Living Arrangements: Non-relatives/Friends  Insurance:   Hospital Account    Name Acct ID Class Status Primary Coverage   Caroline Harvey, Caroline Harvey [id number] [place] MH/DD/SAS - 3-WAY [place]        Guarantor Account (for Hospital Account [id number])    Name Relation to Pt Service Area Active? Acct Type   Caroline Harvey Self [facility]SA Yes [facility]   Address Phone       [place], [place] [address] [phone])          Coverage Information (for Hospital Account [id number])    F/O Payor/Plan Precert #   [place] MH/DD/SAS/3-WAY [facility]    Subscriber Subscriber #   Caroline Harvey, Caroline Harvey PO:[id number]   Address Phone   [address] [place], [place] [address] [phone]      Legal Guardian:     Primary Care Provider:  Aretta Nip, MD  Current Outpatient Providers:  [facility] [place]  Psychiatrist:     Counselor/Therapist:     Compliant with Medications:  No  Additional Information:   Caroline Harvey 3/4/20212:18 AM

## 2019-07-30 NOTE — Progress Notes (Signed)
Adult Psychoeducational Group Note  Pt did not attend morning goals group.

## 2019-07-30 NOTE — Tx Team (Signed)
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 acetaminophen (TYLENOL) tablet 650 mg  650 mg Oral Q6H PRN Anike, Adaku C, NP   650 mg at 07/29/19 1514  . chlordiazePOXIDE (LIBRIUM) capsule 25 mg  25 mg Oral QID Johnn Hai, MD   25 mg at 07/30/19 0815   Followed by  . chlordiazePOXIDE (LIBRIUM) capsule 25 mg  25 mg Oral TID Johnn Hai, MD       Followed by  . [START ON 07/31/2019] chlordiazePOXIDE (LIBRIUM) capsule 25 mg  25 mg Oral Celine Ahr, MD       Followed by  . [START ON 08/02/2019] chlordiazePOXIDE (LIBRIUM) capsule 25 mg  25 mg Oral Daily Johnn Hai, MD      . chlordiazePOXIDE (LIBRIUM) capsule 25 mg  25 mg Oral Q6H PRN Johnn Hai, MD      . hydrOXYzine (ATARAX/VISTARIL) tablet 25 mg  25 mg Oral Q6H PRN Johnn Hai, MD   25 mg at 07/29/19 2155  . multivitamin with minerals tablet 1 tablet  1 tablet Oral Daily Johnn Hai, MD   1 tablet at 07/30/19 0813  . thiamine tablet 100 mg  100 mg Oral Daily Johnn Hai, MD   100 mg at 07/30/19 0813   PTA Medications: Medications Prior to Admission  Medication Sig Dispense Refill Last Dose  . albuterol (PROVENTIL HFA;VENTOLIN HFA) 108 (90 Base) MCG/ACT inhaler Inhale 1-2 puffs into the lungs every 6 (six) hours as needed for wheezing or shortness of breath. (Patient not taking: Reported on 07/29/2019) 1 Inhaler 1 Not Taking at Unknown time  . furosemide (LASIX) 20 MG tablet Take 1 tablet (20 mg total) by mouth daily for 5 days. 5 tablet 0     Patient Stressors:    Patient Strengths:    Treatment Modalities: Medication  Management, Group therapy, Case management,  1 to 1 session with clinician, Psychoeducation, Recreational therapy.   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  Therapeutic Interventions: 1 to 1 sessions, Unit Group sessions and Medication administration.  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  Therapeutic Interventions: 1 to 1 sessions, Unit Group sessions and Medication administration.  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 RN will report  any adverse and/or side effects to prescribing provider.  Therapeutic Interventions: 1 on 1 counseling sessions, Psychoeducation, Medication administration, Evaluate responses to treatment, Monitor vital signs and CBGs as ordered, Perform/monitor CIWA, COWS, AIMS and Fall Risk screenings as ordered, Perform wound care treatments as ordered.  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  Short Term Goals: Engage patient in aftercare planning with referrals and resources  Therapeutic Interventions: Assess for all discharge needs, 1 to 1 time with Social worker, Explore available resources and support systems, Assess for adequacy in community support network, Educate family and significant other(s) on suicide prevention, Complete Psychosocial Assessment, Interpersonal group therapy.  Evaluation of Outcomes: Not Met   Progress in Treatment: Attending groups: No. New to unit  Participating in groups: No. Taking medication as prescribed: Yes. Toleration medication: Yes. Family/Significant other contact made: No, will contact:  if patient consents to collateral contacts Patient understands diagnosis: Yes. Discussing patient identified problems/goals with staff: Yes. Medical problems stabilized or resolved: Yes. Denies suicidal/homicidal ideation: Yes. Issues/concerns per patient self-inventory: No. Other:   New problem(s) identified: None   New Short Term/Long Term Goal(s):Detox, medication stabilization, elimination of SI thoughts, development of comprehensive mental wellness plan.    Patient Goals: "To get off the alcohol and drugs (heroin)"   Discharge Plan or Barriers: Patient recently admitted. Patient reports she is interested in outpatient substance use services and resources. Patient plans to return home at discharge. CSW will continue to follow and assess for appropriate referrals and possible discharge planning.     Reason for Continuation of Hospitalization: Anxiety Depression Medication stabilization Suicidal ideation Withdrawal symptoms  Estimated Length of Stay: 3-5 days   Attendees: Patient: Caroline Harvey  07/30/2019 8:31 AM  Physician: Dr. Neita Garnet, MD 07/30/2019 8:31 AM  Nursing:  07/30/2019 8:31 AM  RN Care Manager: 07/30/2019 8:31 AM  Social Worker: Radonna Ricker, LCSW 07/30/2019 8:31 AM  Recreational Therapist:  07/30/2019 8:31 AM  Other:  07/30/2019 8:31 AM  Other:  07/30/2019 8:31 AM  Other: 07/30/2019 8:31 AM    Scribe for Treatment Team: Marylee Floras, [place] 07/30/2019 8:31 AM

## 2019-07-30 NOTE — Progress Notes (Signed)
Recreation Therapy Notes  Date: 3.5.21 Time: 0930 Location: [address] Dayroom  Group Topic: Stress Management  Goal Area(s) Addresses:  Patient will identify positive stress management techniques. Patient will identify benefits of using stress management post d/c.  Behavioral Response: Engaged  Intervention: Stress Management  Activity :  Meditation.  LRT played a meditation that focused on making the most of your day and bringing positive intentions into how you maneuver through the day.  Education:  Stress Management, Discharge Planning.   Education Outcome: Acknowledges Education  Clinical Observations/Feedback: Pt attended and participated in group activity.     Victorino Sparrow, LRT/CTRS         Ria Comment, Dajohn Ellender A 07/30/2019 11:02 AM

## 2019-07-30 NOTE — Progress Notes (Signed)
   07/30/19 2300  Psych Admission Type (Psych Patients Only)  Admission Status Voluntary  Psychosocial Assessment  Patient Complaints Anxiety;Depression  Eye Contact Avoids  Facial Expression Pained;Sad  Affect Depressed  Speech Logical/coherent  Interaction Assertive  Motor Activity Shuffling;Pacing  Appearance/Hygiene Unremarkable  Behavior Characteristics Cooperative  Mood Depressed;Anxious  Aggressive Behavior  Targets Self  Type of Behavior Weapon  Effect No apparent injury  Thought Process  Coherency WDL  Content Blaming self;Blaming others  Delusions Paranoid  Perception WDL  Hallucination Auditory;Olfactory  Judgment Poor  Confusion None  Danger to Self  Current suicidal ideation? Denies  Danger to Others  Danger to Others None reported or observed

## 2019-07-30 NOTE — [facility] Counselor (Signed)
CSW attempted to meet with patient on unit to complete assessment. Patient asked to meet with Social Work staff at a later time, stating she was experiencing withdrawal symptoms and wanted to rest.  Patient was made aware that she will need to meet with CSW in order for referrals for residential treatment to be sent.  Stephanie Acre, MSW, [place] Social Worker [facility] Adult Unit  [phone]

## 2019-07-30 NOTE — Progress Notes (Signed)
[facility] MD Progress Note  07/30/2019 11:10 AM Jaysa Vanderlip  MRN:  [id number]  Subjective: Nijia reports, "I've drinking alcohol for a long time. But, I started heroin about 1 year ago, now I'm hooked on it. I'm not depressed, I just wanna get off drugs. I'm having cold/hot flashes. I feel like there are pins & needles poking me all over my body right".  Objective: Ms. 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  She will be admitted voluntarily. Hadly is seen, chart reviewed. The chart findings discussed with the treatment team. She presents alert, oriented & aware of situation. She is visible on the unit, attending group sessions. She is verbally responsive, making good eye contact. She says she is not depressed or feeling depressed. She says she has been drinking heavily for a long time, however, she & her friend decided to try heroin about 1 year ago, she finds herself hooked on this drug these days. She came to [facility] seeking assistance to enroll in a long term substance abuse treatment program to help her quit drugs for good. She currently denies any SIHI, AVH, delusional thoughts or paranoia. She does not appear to be responding to any internal stimuli. She is however complaining of cold/hot flashes, feeling like she is being poked with pins & needles. Her BAL was < 10 on admission. Ellenie is in agreement to continue current plan of care as already in progress.  Principal Problem: Mood disorder, drug-induced (HCC)  Diagnosis: Principal Problem:   Mood disorder, drug-induced (HCC) Active Problems:   Substance induced mood disorder (HCC)  Total Time spent with patient: 25 minutes  Past Psychiatric  History: See H&P  Past Medical History:  Past Medical History:  Diagnosis Date  . Alcohol abuse   . Anemia   . Hypertension   . Hyponatremia 09/2014    Past Surgical History:  Procedure Laterality Date  . ANKLE SURGERY Right    x  2   Family History:  Family History  Problem Relation Age of Onset  . Hypertension Mother   . Breast cancer Paternal Aunt        ? age of onset  . Breast cancer Paternal Aunt        ? age of onset   Family Psychiatric  History: See H&P  Social History:  Social History   Substance and Sexual Activity  Alcohol Use Yes   Comment: Quit May 20 2015     Social History   Substance and Sexual Activity  Drug Use No    Social History   Socioeconomic History  . Marital status: Single    Spouse name: Not on file  . Number of children: Not on file  . Years of education: Not on file  . Highest education level: Not on file  Occupational History  . Not on file  Tobacco Use  . Smoking status: Never Smoker  . Smokeless tobacco: Never Used  Substance and Sexual Activity  . Alcohol use: Yes    Comment: Quit May 20 2015  . Drug use: No  . Sexual activity: Not on file  Other Topics Concern  . Not on file  Social History Narrative  . Not  on file   Social Determinants of Health   Financial Resource Strain:   . Difficulty of Paying Living Expenses: Not on file  Food Insecurity:   . Worried About Charity fundraiser in the Last Year: Not on file  . Ran Out of Food in the Last Year: Not on file  Transportation Needs:   . Lack of Transportation (Medical): Not on file  . Lack of Transportation (Non-Medical): Not on file  Physical Activity:   . Days of Exercise per Week: Not on file  . Minutes of Exercise per Session: Not on file  Stress:   . Feeling of Stress : Not on file  Social Connections:   . Frequency of Communication with Friends and Family: Not on file  . Frequency of Social Gatherings with Friends and Family: Not on file  . Attends  Religious Services: Not on file  . Active Member of Clubs or Organizations: Not on file  . Attends Archivist Meetings: Not on file  . Marital Status: Not on file   Additional Social History:  Pain Medications: See MAR Prescriptions: See MAR Over the Counter: See MAR History of alcohol / drug use?: Yes Longest period of sobriety (when/how long): 2 months Negative Consequences of Use: Personal relationships Withdrawal Symptoms: Agitation, Irritability  Sleep: Good  Appetite:  Good  Current Medications: Current Facility-Administered Medications  Medication Dose Route Frequency Provider Last Rate Last Admin  . acetaminophen (TYLENOL) tablet 650 mg  650 mg Oral Q6H PRN Anike, Adaku C, NP   650 mg at 07/29/19 1514  . chlordiazePOXIDE (LIBRIUM) capsule 25 mg  25 mg Oral QID Johnn Hai, MD   25 mg at 07/30/19 0815   Followed by  . chlordiazePOXIDE (LIBRIUM) capsule 25 mg  25 mg Oral TID Johnn Hai, MD       Followed by  . [START ON 07/31/2019] chlordiazePOXIDE (LIBRIUM) capsule 25 mg  25 mg Oral Celine Ahr, MD       Followed by  . [START ON 08/02/2019] chlordiazePOXIDE (LIBRIUM) capsule 25 mg  25 mg Oral Daily Johnn Hai, MD      . chlordiazePOXIDE (LIBRIUM) capsule 25 mg  25 mg Oral Q6H PRN Johnn Hai, MD      . hydrOXYzine (ATARAX/VISTARIL) tablet 25 mg  25 mg Oral Q6H PRN Johnn Hai, MD   25 mg at 07/29/19 2155  . multivitamin with minerals tablet 1 tablet  1 tablet Oral Daily Johnn Hai, MD   1 tablet at 07/30/19 0813  . thiamine tablet 100 mg  100 mg Oral Daily Johnn Hai, MD   100 mg at 07/30/19 [id number]    Lab Results:  Results for orders placed or performed during the hospital encounter of 07/29/19 (from the past 48 hour(s))  Respiratory Panel by RT PCR (Flu A&B, Covid) - Nasopharyngeal Swab     Status: None   Collection Time: 07/29/19  1:05 AM   Specimen: Nasopharyngeal Swab  Result Value Ref Range   SARS Coronavirus 2 by RT PCR NEGATIVE NEGATIVE     Comment: (NOTE) SARS-CoV-2 target nucleic acids are NOT DETECTED. The SARS-CoV-2 RNA is generally detectable in upper respiratoy specimens during the acute phase of infection. The lowest concentration of SARS-CoV-2 viral copies this assay can detect is 131 copies/mL. A negative result does not preclude SARS-Cov-2 infection and should not be used as the sole basis for treatment or other patient management decisions. A negative result may occur with  improper specimen collection/handling,  submission of specimen other than nasopharyngeal swab, presence of viral mutation(s) within the areas targeted by this assay, and inadequate number of viral copies (<131 copies/mL). A negative result must be combined with clinical observations, patient history, and epidemiological information. The expected result is Negative. Fact Sheet for Patients:  PinkCheek.be Fact Sheet for Healthcare Providers:  GravelBags.it This test is not yet ap proved or cleared by the [place] FDA and  has been authorized for detection and/or diagnosis of SARS-CoV-2 by FDA under an Emergency Use Authorization (EUA). This EUA will remain  in effect (meaning this test can be used) for the duration of the COVID-19 declaration under Section 564(b)(1) of the Act, 21 U.S.C. section 360bbb-3(b)(1), unless the authorization is terminated or revoked sooner.    Influenza A by PCR NEGATIVE NEGATIVE   Influenza B by PCR NEGATIVE NEGATIVE    Comment: (NOTE) The Xpert Xpress SARS-CoV-2/FLU/RSV assay is intended as an aid in  the diagnosis of influenza from Nasopharyngeal swab specimens and  should not be used as a sole basis for treatment. Nasal washings and  aspirates are unacceptable for Xpert Xpress SARS-CoV-2/FLU/RSV  testing. Fact Sheet for Patients: PinkCheek.be Fact Sheet for Healthcare  Providers: GravelBags.it This test is not yet approved or cleared by the [place] FDA and  has been authorized for detection and/or diagnosis of SARS-CoV-2 by  FDA under an Emergency Use Authorization (EUA). This EUA will remain  in effect (meaning this test can be used) for the duration of the  Covid-19 declaration under Section 564(b)(1) of the Act, 21  U.S.C. section 360bbb-3(b)(1), unless the authorization is  terminated or revoked. Performed at [facility], [place] [address], [place], [place] [address]   Comprehensive metabolic panel     Status: Abnormal   Collection Time: 07/29/19  7:47 AM  Result Value Ref Range   Sodium 133 (L) 135 - 145 mmol/L   Potassium 4.0 3.5 - 5.1 mmol/L   Chloride 97 (L) 98 - 111 mmol/L   CO2 28 22 - 32 mmol/L   Glucose, Bld 93 70 - 99 mg/dL    Comment: Glucose reference range applies only to samples taken after fasting for at least 8 hours.   BUN 7 6 - 20 mg/dL   Creatinine, Ser 0.70 0.44 - 1.00 mg/dL   Calcium 9.1 8.9 - 10.3 mg/dL   Total Protein 7.2 6.5 - 8.1 g/dL   Albumin 3.5 3.5 - 5.0 g/dL   AST 18 15 - 41 U/L   ALT 10 0 - 44 U/L   Alkaline Phosphatase 78 38 - 126 U/L   Total Bilirubin 0.4 0.3 - 1.2 mg/dL   GFR calc non Af Amer >60 >60 mL/min   GFR calc Af Amer >60 >60 mL/min   Anion gap 8 5 - 15    Comment: Performed at [facility], [place] [address] [place], [place] [address]  Hemoglobin A1c     Status: None   Collection Time: 07/29/19  7:47 AM  Result Value Ref Range   Hgb A1c MFr Bld 5.0 4.8 - 5.6 %    Comment: (NOTE) Pre diabetes:          5.7%-6.4% Diabetes:              >6.4% Glycemic control for   <7.0% adults with diabetes    Mean Plasma Glucose 96.8 mg/dL    Comment: Performed at [place] [address], [place], [place] [address]  Magnesium     Status: None   Collection Time: 07/29/19  7:47 AM  Result Value Ref Range   Magnesium 2.2 1.7 - 2.4  mg/dL    Comment: Performed at [facility], [place] [address] [place], [place] [address]  Ethanol     Status: None   Collection Time: 07/29/19  7:47 AM  Result Value Ref Range   Alcohol, Ethyl (B) <10 <10 mg/dL    Comment: (NOTE) Lowest detectable limit for serum alcohol is 10 mg/dL. For medical purposes only. Performed at [facility], [place] [address], [place], [place] [address]   Lipid panel     Status: None   Collection Time: 07/29/19  7:47 AM  Result Value Ref Range   Cholesterol 161 0 - 200 mg/dL   Triglycerides 31 <150 mg/dL   HDL 96 >40 mg/dL   Total CHOL/HDL Ratio 1.7 RATIO   VLDL 6 0 - 40 mg/dL   LDL Cholesterol 59 0 - 99 mg/dL    Comment:        Total Cholesterol/HDL:CHD Risk Coronary Heart Disease Risk Table                     Men   Women  1/2 Average Risk   3.4   3.3  Average Risk       5.0   4.4  2 X Average Risk   9.6   7.1  3 X Average Risk  23.4   11.0        Use the calculated Patient Ratio above and the CHD Risk Table to determine the patient's CHD Risk.        ATP III CLASSIFICATION (LDL):  <100     mg/dL   Optimal  100-129  mg/dL   Near or Above                    Optimal  130-159  mg/dL   Borderline  160-189  mg/dL   High  >190     mg/dL   Very High Performed at [place] [address], [place], [place] [address]   Hepatic function panel     Status: None   Collection Time: 07/29/19  7:47 AM  Result Value Ref Range   Total Protein 7.2 6.5 - 8.1 g/dL   Albumin 3.6 3.5 - 5.0 g/dL   AST 18 15 - 41 U/L   ALT 11 0 - 44 U/L   Alkaline Phosphatase 79 38 - 126 U/L   Total Bilirubin 0.6 0.3 - 1.2 mg/dL   Bilirubin, Direct 0.1 0.0 - 0.2 mg/dL   Indirect Bilirubin 0.5 0.3 - 0.9 mg/dL    Comment: Performed at [facility], [place] [address], [place], [place] [address]  TSH     Status: None   Collection Time: 07/29/19  7:47 AM  Result Value Ref Range   TSH 1.632 0.350 - 4.500  uIU/mL    Comment: Performed by a 3rd Generation assay with a functional sensitivity of <=0.01 uIU/mL. Performed at [facility], [place] [address] [place], [place] [address]     Blood Alcohol level:  Lab Results  Component Value Date   [facility] <10 07/29/2019   ETH <5 0000000    Metabolic Disorder Labs: Lab Results  Component Value Date   HGBA1C 5.0 07/29/2019   MPG 96.8 07/29/2019   No results found for: PROLACTIN Lab Results  Component Value  Date   CHOL 161 07/29/2019   TRIG 31 07/29/2019   HDL 96 07/29/2019   CHOLHDL 1.7 07/29/2019   VLDL 6 07/29/2019   LDLCALC 59 07/29/2019   LDLCALC 31 01/18/2008   Physical Findings: AIMS: Facial and Oral Movements Muscles of Facial Expression: None, normal Lips and Perioral Area: None, normal Jaw: None, normal Tongue: None, normal,Extremity Movements Upper (arms, wrists, hands, fingers): None, normal Lower (legs, knees, ankles, toes): None, normal, Trunk Movements Neck, shoulders, hips: None, normal, Overall Severity Severity of abnormal movements (highest score from questions above): None, normal Incapacitation due to abnormal movements: None, normal Patient's awareness of abnormal movements (rate only patient's report): No Awareness, Dental Status Current problems with teeth and/or dentures?: No Does patient usually wear dentures?: No  CIWA:  CIWA-Ar Total: 4 COWS:  COWS Total Score: 5  Musculoskeletal: Strength & Muscle Tone: within normal limits Gait & Station: normal Patient leans: N/A  Psychiatric Specialty Exam: Physical Exam  Nursing note and vitals reviewed. Constitutional: She is oriented to person, place, and time. She appears well-developed.  Eyes: Pupils are equal, round, and reactive to light.  Cardiovascular:  Elevated blood pressure: 170/88  Respiratory: Effort normal.  Genitourinary:    Genitourinary Comments: Deferred   Musculoskeletal:        General: Normal range of motion.      Cervical back: Normal range of motion.  Neurological: She is alert and oriented to person, place, and time.  Skin: Skin is warm and dry.    Review of Systems  Constitutional: Positive for chills. Negative for diaphoresis.  HENT: Negative for congestion, rhinorrhea and sneezing.   Respiratory: Negative for cough, shortness of breath and wheezing.   Cardiovascular: Negative for chest pain and palpitations.  Gastrointestinal: Negative for diarrhea, nausea and vomiting.  Genitourinary: Negative for difficulty urinating.  Musculoskeletal: Positive for arthralgias and myalgias.  Skin: Negative for color change.  Allergic/Immunologic: Negative for environmental allergies and food allergies.       NKDA  Neurological: Positive for tremors. Negative for dizziness, seizures, syncope, light-headedness, numbness and headaches.  Psychiatric/Behavioral: Negative for agitation, behavioral problems, confusion, decreased concentration, dysphoric mood, hallucinations, self-injury, sleep disturbance and suicidal ideas. The patient is nervous/anxious. The patient is not hyperactive.     Blood pressure (!) 170/88, pulse 68, temperature 97.9 F (36.6 C), temperature source Oral, resp. rate 16, last menstrual period 12/14/2010, SpO2 100 %.There is no height or weight on file to calculate BMI.  General Appearance: Fairly groomed, obese  Eye Contact:  Fair  Speech:  Clear and Coherent and Normal Rate  Volume:  Normal  Mood:  Denies any symptoms of depression  Affect:  Appropriate  Thought Process:  Coherent, Goal Directed and Descriptions of Associations: Intact  Orientation:  Full (Time, Place, and Person)  Thought Content:  Logical  Suicidal Thoughts:  Currently denies any hallucinations, delusions or paranoia  Homicidal Thoughts:  Denies  Memory:  Immediate;   Good Recent;   Good Remote;   Good  Judgement:  Intact  Insight:  Present  Psychomotor Activity:  "I'm a little anxious today".  Concentration:   Concentration: Good and Attention Span: Good  Recall:  Good  Fund of Knowledge:  Good  Language:  Good  Akathisia:  NA  Handed:  Right  AIMS (if indicated):     Assets:  Communication Skills Desire for Improvement Physical Health  ADL's:  Intact  Cognition:  WNL  Sleep:  Number of Hours: 5.25   Treatment Plan Summary:  Daily contact with patient to assess and evaluate symptoms and progress in treatment and Medication management.  - Continue inpatient hospitalization.  - Will continue today 07/30/2019 plan as below except where it is noted.  Substance withdrawal symptoms.     - Continue the Librium detoxification treatment protocols as already in progress.  - Continue the routine pen medications fpr minor aches & pains.  - Encourage group session attendance & participation. - Discharge disposition plan is ongoing.  Lindell Spar, NP, PMHNP, FNP-BC 07/30/2019, 11:10 AM

## 2019-07-30 NOTE — [facility] Group Notes (Signed)
07/30/2019 8:45am Type of Group and Topic: Psychoeducational Group: Discharge Planning Participation Level: Active  Description of Group Discharge planning group reviews patient's anticipated discharge plans and assists patients to anticipate and address any barriers to wellness/recovery in the community. Suicide prevention education is reviewed with patients in group. Therapeutic Goals 1. Patients will state their anticipated discharge plan and mental health aftercare 2. Patients will identify potential barriers to wellness in the community setting 3. Patients will engage in problem solving, solution focused discussion of ways to anticipate and address barriers to wellness/recovery   Summary of Patient Progress Plan for Discharge/Comments:   Felix shared that she is interested in residential treatment to help her with her substance abuse issues. She shared that she has used drugs and alcohol for a "long time" and now she needs help. She states she would also like to be referred to an outpatient agency that provide substance abuse counseling at discharge.      Therapeutic Modalities: Motivational Interviewing     Radonna Ricker, MSW, [place] Worker [facility]  Phone: [phone] 07/30/2019 1:20 PM

## 2019-07-30 NOTE — Progress Notes (Signed)
   07/30/19 0745  Psych Admission Type (Psych Patients Only)  Admission Status Voluntary  Psychosocial Assessment  Patient Complaints Anxiety;Depression  Eye Contact Avoids  Facial Expression Pained;Sad  Affect Depressed  Speech Logical/coherent  Interaction Assertive  Motor Activity Shuffling;Pacing  Appearance/Hygiene Unremarkable  Behavior Characteristics Cooperative  Mood Depressed;Anxious  Aggressive Behavior  Targets Self  Type of Behavior Weapon  Effect No apparent injury  Thought Process  Coherency WDL  Content Blaming self;Blaming others  Delusions Paranoid  Perception WDL  Hallucination Auditory;Olfactory  Judgment Poor  Confusion None  Danger to Self  Current suicidal ideation? Denies  Danger to Others  Danger to Others None reported or observed

## 2019-07-31 MED ORDER — AMLODIPINE BESYLATE 5 MG PO TABS
5.0000 mg | ORAL_TABLET | Freq: Every day | ORAL | Status: DC
  Administered 2019-07-31 – 2019-08-03 (×4): 5 mg via ORAL
  Filled 2019-07-31 (×4): qty 1
  Filled 2019-07-31: qty 14
  Filled 2019-07-31: qty 1

## 2019-07-31 MED ORDER — INFLUENZA VAC SPLIT QUAD 0.5 ML IM SUSY
0.5000 mL | PREFILLED_SYRINGE | INTRAMUSCULAR | Status: DC
  Filled 2019-07-31: qty 0.5

## 2019-07-31 NOTE — BHH Counselor (Signed)
Adult Comprehensive Assessment  Patient ID: Caroline Harvey, female   DOB: Jul 04, 1960, 59 y.o.   MRN: [id number]  Information Source: Information source: Patient  Current Stressors:  Patient states their primary concerns and needs for treatment are:: Detox from heroin and alcohol Patient states their goals for this hospitilization and ongoing recovery are:: Detox from heroin and alcohol Educational / Learning stressors: Denies stressors Employment / Job issues: Denies stressors Family Relationships: Denies Engineer, mining / Lack of resources (include bankruptcy): "I'm broke and can't pay my bills." Housing / Lack of housing: Denies stressor Physical health (include injuries & life threatening diseases): Denies stressors Social relationships: Denies stressors Substance abuse: Uses heroin and alcohol because she enjoys it.  It keeps her "broke all the time and I can't pay my bills." Bereavement / Loss: Denies stressors  Living/Environment/Situation:  Living Arrangements: Alone Living conditions (as described by patient or guardian): [place] Who else lives in the home?: 2 dogs How long has patient lived in current situation?: 1 year What is atmosphere in current home: Comfortable  Family History:  Marital status: Single What is your sexual orientation?: Gay Does patient have children?: Yes How many children?: 1 How is patient's relationship with their children?: Adult daughter - wonderful relationship, wanted patient to get help  Childhood History:  By whom was/is the patient raised?: Both parents Description of patient's relationship with caregiver when they were a child: Wonderful relationship with both parents Patient's description of current relationship with people who raised him/her: Wonderful with mother; father is deceased.  Mother does live locally. How were you disciplined when you got in trouble as a child/adolescent?: Fussed at Does patient have siblings?: Yes Number  of Siblings: 5 Description of patient's current relationship with siblings: 3 brothers, 2 sisters - lovely relationships Did patient suffer any verbal/emotional/physical/sexual abuse as a child?: No Did patient suffer from severe childhood neglect?: No Has patient ever been sexually abused/assaulted/raped as an adolescent or adult?: No Was the patient ever a victim of a crime or a disaster?: No Witnessed domestic violence?: No Has patient been effected by domestic violence as an adult?: No  Education:  Highest grade of school patient has completed: 2 years college Currently a Ship broker?: No Learning disability?: No  Employment/Work Situation:   Employment situation: Employed 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 has patient been employed?: Since 2004 Patient's job has been impacted by current illness: No What is the longest time patient has a held a job?: 17 years 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 Did You Receive Any Psychiatric Treatment/Services While in Passenger transport manager?: (No Armed forces logistics/support/administrative officer) Are There Guns or Other Weapons in [place]?: No  Financial Resources:   Financial resources: Income from employment Does patient have a representative payee or guardian?: No  Alcohol/Substance Abuse:   What has been your use of drugs/alcohol within the last 12 months?: Heroin for the last year, has been snorting it daily; Alcohol since age 81yo, 2 small bootleg wines and 6-7 pack of beer daily; Powder cocaine once every 2 days or so Alcohol/Substance Abuse Treatment Hx: Past Tx, Inpatient, Attends AA/NA If yes, describe treatment: [facility] 2 years ago Has alcohol/substance abuse ever caused legal problems?: No  Social Support System:   Patient's Community Support System: Good Describe Community Support System: Family Type of faith/religion: None How does patient's faith help to cope with current illness?: N/A  Leisure/Recreation:   Leisure and  Hobbies: "I don't really have any recreation."  Strengths/Needs:   What is the patient's perception of their strengths?: To better myself, get myself together. Patient states they can use these personal strengths during their treatment to contribute to their recovery: Yes Patient states these barriers may affect/interfere with their treatment: None Patient states these barriers may affect their return to the community: None Other important information patient would like considered in planning for their treatment: None  Discharge Plan:   Currently receiving community mental health services: No Patient states concerns and preferences for aftercare planning are: Would like to go to rehab for 30 days or 3 months. Patient states they will know when they are safe and ready for discharge when: When participating in classes Does patient have access to transportation?: Yes Does patient have financial barriers related to discharge medications?: Yes Patient description of barriers related to discharge medications: No insurance, limited income Plan for living situation after discharge: Wants to go to rehab Will patient be returning to same living situation after discharge?: No  Summary/Recommendations:   Summary and Recommendations (to be completed by the evaluator): Patient is a 59yo female admitted with visual hallucinations and suicidal ideation with a plan to overdose on Gabapentin that she can buy on the street. Pt reports that she snorts heroin daily, drinks significant amounts of alcohol daily, and also uses powder cocaine 4-7 times a week.  She reports she started abusing substances last year due to her sponsor moving out of state, ongoing family conflicts, financial stress and low self esteem.   Primary stressor is financial, being unable to pay her bills because she spends all her money on substances.  Patient will benefit from crisis stabilization, medication evaluation, group therapy and  psychoeducation, in addition to case management for discharge planning. At discharge it is recommended that Patient adhere to the established discharge plan and continue in treatment.  Maretta Los. 07/31/2019

## 2019-07-31 NOTE — BHH Group Notes (Signed)
LCSW Group Therapy Note  07/31/2019   10:00-11:00am   Type of Therapy and Topic:  Group Therapy: Anger Cues and Responses  Participation Level:  Did Not Attend   Description of Group:   In this group, patients learned how to recognize the physical, cognitive, emotional, and behavioral responses they have to anger-provoking situations.  They identified a recent time they became angry and how they reacted.  They analyzed how their reaction was possibly beneficial and how it was possibly unhelpful.  The group discussed a variety of healthier coping skills that could help with such a situation in the future.  Focus was placed on how helpful it is to recognize the underlying emotions to our anger, because working on those can lead to a more permanent solution as well as our ability to focus on the important rather than the urgent.  Therapeutic Goals: 1. Patients will remember their last incident of anger and how they felt emotionally and physically, what their thoughts were at the time, and how they behaved. 2. Patients will identify how their behavior at that time worked for them, as well as how it worked against them. 3. Patients will explore possible new behaviors to use in future anger situations. 4. Patients will learn that anger itself is normal and cannot be eliminated, and that healthier reactions can assist with resolving conflict rather than worsening situations.  Summary of Patient Progress:  The patient was invited to group, opted not to attend.  Therapeutic Modalities:   Cognitive Behavioral Therapy  Caroline Harvey

## 2019-07-31 NOTE — Progress Notes (Signed)
   07/31/19 0900  Psych Admission Type (Psych Patients Only)  Admission Status Voluntary  Psychosocial Assessment  Patient Complaints Anxiety;Depression  Eye Contact Avoids  Facial Expression Pained;Sad  Affect Depressed  Speech Logical/coherent  Interaction Assertive  Motor Activity Slow  Appearance/Hygiene Unremarkable  Behavior Characteristics Cooperative  Mood Depressed;Pleasant  Aggressive Behavior  Targets Self  Type of Behavior Weapon  Effect No apparent injury  Thought Process  Coherency WDL  Content WDL  Delusions None reported or observed  Perception WDL  Hallucination None reported or observed  Judgment Poor  Confusion None  Danger to Self  Current suicidal ideation? Denies  Danger to Others  Danger to Others None reported or observed

## 2019-07-31 NOTE — BH Assessment (Signed)
Pt Bp has been elevated, pt has Hx HTN

## 2019-07-31 NOTE — BHH Group Notes (Signed)
Adult Psychoeducational Group Note  Date:  07/31/2019 Time:  11:40 AM  Group Topic/Focus:  Goals Group:   The focus of this group is to help patients establish daily goals to achieve during treatment and discuss how the patient can incorporate goal setting into their daily lives to aide in recovery.  Participation Level:  Active  Participation Quality:  Appropriate  Affect:  Appropriate  Cognitive:  Appropriate  Insight: Improving  Engagement in Group:  Engaged  Modes of Intervention:  Activity  Additional Comments:  Pt 's goal is to write 30 positives about herself  Caroline Harvey 07/31/2019, 11:40 AM

## 2019-07-31 NOTE — Progress Notes (Addendum)
[facility] MD Progress Note  07/31/2019 9:20 AM Caroline Harvey  MRN:  [id number] Subjective:  Patient reports she is feeling " a little better". She denies vomiting or diarrhea and is tolerating PO intake well . Currently denies suicidal ideations. Denies medication side effects at this time. Objective: I have reviewed chart notes and have met with patient. 76 y old female, presented to [facility] on 3/3 reporting alcohol and opiate ( heroin) abuse. Reported she was drinking up to 9 24 ounce beers daily and had recently been using heroin as well. Reported depression and endorsed suicidal ideations with thoughts of overdosing. 3/4 BAL was negative.  Today patient reports some improvement compared to admission although states " I still feel rough". She endorses some vague aches/discomfort but no other opiate WDL symptoms, and denies nausea, vomiting , or diarrhea at this time and is tolerating PO intake well . No significant distal tremors or diaphoresis, no restlessness or agitation. BP 150/101, pulse 84. She reports partially improved mood and states she is feeling less depressed than on admission. Affect is constricted but tends to improve during session, and smiles at times appropriately . Denies medication side effects at this time. No disruptive or agitated behaviors on unit, more visible on unit today.  Principal Problem: Mood disorder, drug-induced (HCC) Diagnosis: Principal Problem:   Mood disorder, drug-induced (HCC) Active Problems:   Substance induced mood disorder ([place])  Total Time spent with patient: 15 minutes  Past Psychiatric History:   Past Medical History:  Past Medical History:  Diagnosis Date  . Alcohol abuse   . Anemia   . Hypertension   . Hyponatremia 09/2014    Past Surgical History:  Procedure Laterality Date  . ANKLE SURGERY Right    x  2   Family History:  Family History  Problem Relation Age of Onset  . Hypertension Mother   . Breast cancer Paternal Aunt        ?  age of onset  . Breast cancer Paternal Aunt        ? age of onset   Family Psychiatric  History:  Social History:  Social History   Substance and Sexual Activity  Alcohol Use Yes   Comment: Quit May 20 2015     Social History   Substance and Sexual Activity  Drug Use No    Social History   Socioeconomic History  . Marital status: Single    Spouse name: Not on file  . Number of children: Not on file  . Years of education: Not on file  . Highest education level: Not on file  Occupational History  . Not on file  Tobacco Use  . Smoking status: Never Smoker  . Smokeless tobacco: Never Used  Substance and Sexual Activity  . Alcohol use: Yes    Comment: Quit May 20 2015  . Drug use: No  . Sexual activity: Not on file  Other Topics Concern  . Not on file  Social History Narrative  . Not on file   Social Determinants of Health   Financial Resource Strain:   . Difficulty of Paying Living Expenses: Not on file  Food Insecurity:   . Worried About Charity fundraiser in the Last Year: Not on file  . Ran Out of Food in the Last Year: Not on file  Transportation Needs:   . Lack of Transportation (Medical): Not on file  . Lack of Transportation (Non-Medical): Not on file  Physical Activity:   . Days  of Exercise per Week: Not on file  . Minutes of Exercise per Session: Not on file  Stress:   . Feeling of Stress : Not on file  Social Connections:   . Frequency of Communication with Friends and Family: Not on file  . Frequency of Social Gatherings with Friends and Family: Not on file  . Attends Religious Services: Not on file  . Active Member of Clubs or Organizations: Not on file  . Attends Archivist Meetings: Not on file  . Marital Status: Not on file   Additional Social History:    Pain Medications: See MAR Prescriptions: See MAR Over the Counter: See MAR History of alcohol / drug use?: Yes Longest period of sobriety (when/how long): 2 months Negative  Consequences of Use: Personal relationships Withdrawal Symptoms: Agitation, Irritability    Sleep: Fair  Appetite:  improving   Current Medications: Current Facility-Administered Medications  Medication Dose Route Frequency Provider Last Rate Last Admin  . acetaminophen (TYLENOL) tablet 650 mg  650 mg Oral Q6H PRN Anike, Adaku C, NP   650 mg at 07/29/19 1514  . chlordiazePOXIDE (LIBRIUM) capsule 25 mg  25 mg Oral TID Johnn Hai, MD   25 mg at 07/31/19 0820   Followed by  . chlordiazePOXIDE (LIBRIUM) capsule 25 mg  25 mg Oral Celine Ahr, MD       Followed by  . [START ON 08/02/2019] chlordiazePOXIDE (LIBRIUM) capsule 25 mg  25 mg Oral Daily Johnn Hai, MD      . chlordiazePOXIDE (LIBRIUM) capsule 25 mg  25 mg Oral Q6H PRN Johnn Hai, MD   25 mg at 07/31/19 0206  . hydrOXYzine (ATARAX/VISTARIL) tablet 25 mg  25 mg Oral Q6H PRN Johnn Hai, MD   25 mg at 07/31/19 0206  . multivitamin with minerals tablet 1 tablet  1 tablet Oral Daily Johnn Hai, MD   1 tablet at 07/31/19 [phone]  . thiamine tablet 100 mg  100 mg Oral Daily Johnn Hai, MD   100 mg at 07/31/19 0820    Lab Results: No results found for this or any previous visit (from the past 75 hour(s)).  Blood Alcohol level:  Lab Results  Component Value Date   ETH <10 07/29/2019   ETH <5 96/08/5407    Metabolic Disorder Labs: Lab Results  Component Value Date   HGBA1C 5.0 07/29/2019   MPG 96.8 07/29/2019   No results found for: PROLACTIN Lab Results  Component Value Date   CHOL 161 07/29/2019   TRIG 31 07/29/2019   HDL 96 07/29/2019   CHOLHDL 1.7 07/29/2019   VLDL 6 07/29/2019   LDLCALC 59 07/29/2019   LDLCALC 31 01/18/2008    Physical Findings: AIMS: Facial and Oral Movements Muscles of Facial Expression: None, normal Lips and Perioral Area: None, normal Jaw: None, normal Tongue: None, normal,Extremity Movements Upper (arms, wrists, hands, fingers): None, normal Lower (legs, knees, ankles,  toes): None, normal, Trunk Movements Neck, shoulders, hips: None, normal, Overall Severity Severity of abnormal movements (highest score from questions above): None, normal Incapacitation due to abnormal movements: None, normal Patient's awareness of abnormal movements (rate only patient's report): No Awareness, Dental Status Current problems with teeth and/or dentures?: No Does patient usually wear dentures?: No  CIWA:  CIWA-Ar Total: 0 COWS:  COWS Total Score: 3  Musculoskeletal: Strength & Muscle Tone: within normal limits no significant distal tremors, no diaphoresis, no restlessness or agitation Gait & Station: slow gait  Patient leans: N/A  Psychiatric  Specialty Exam: Physical Exam  Review of Systems denies chest pain , no shortness of breath at room air, no vomiting, no diarrhea   Blood pressure (!) 150/101, pulse 84, temperature (!) 97.4 F (36.3 C), temperature source Oral, resp. rate 20, last menstrual period 12/14/2010, SpO2 100 %.There is no height or weight on file to calculate BMI.  General Appearance: Fairly Groomed  Eye Contact:  Good  Speech:  Normal Rate  Volume:  Normal  Mood:  reports some improvement, vaguely dysphoric  Affect:  initially constricted, improves during session  Thought Process:  Linear and Descriptions of Associations: Intact  Orientation:  Other:  fully alert and attentive  Thought Content:  no hallucinations, no delusions expressed   Suicidal Thoughts:  No denies suicidal or self injurious ideations , denies homicidal or violent ideations  Homicidal Thoughts:  No  Memory:  recent and remote grossly intact  Judgement:  Fair  Insight:  Fair  Psychomotor Activity:  no significant distal tremors or psychomotor agitation  Concentration:  Concentration: Fair and Attention Span: Fair  Recall:  [employer] of Knowledge:  Fair  Language:  Good  Akathisia:  Negative  Handed:  Right  AIMS (if indicated):     Assets:  Desire for  Improvement Resilience  ADL's:  Intact  Cognition:  WNL  Sleep:  Number of Hours: 3.75   Assessment -  30 y old female, presented to [facility] on 3/3 reporting alcohol and opiate ( heroin) abuse. Reported she was drinking up to 9 24 ounce beers daily and had recently been using heroin as well. Reported depression and endorsed suicidal ideations with thoughts of overdosing. 3/4 BAL was negative.  Today patient reports some improvement compared to admission. She describes lingering aches , discomfort but appears calm and in no acute discomfort or distress. No vomiting or diarrhea. No tremors, restlessness or diaphoresis,  tolerating Librium taper well at this time. BP 150/101 pulse 84. Chart notes indicate history of HTN and past management with Norvasc. Patient reports she feels her depression is mainly substance induced and states that her mood tends to normalize during periods of sobriety. At this time prefers to wait to see how her mood improves prior to deciding whether to start an antidepressant medication.   Treatment Plan Summary: Daily contact with patient to assess and evaluate symptoms and progress in treatment, Medication management, Plan inpatient treatment and medications as below Treatment Plan reviewed as below today 3/6  Encourage group and milieu participation Encourage efforts to work on sobriety and relapse prevention Treatment team working on disposition planning options, patient is expressing interest in going to a rehab at discharge  Continue Librium detox protocol for alcohol WDL Continue Thiamine and MVI/Folate supplementation  Start Norvasc 5 mgrs QDAY for HTN    Jenne Campus, MD 07/31/2019, 9:20 AM

## 2019-07-31 NOTE — Progress Notes (Signed)
   07/31/19 2254  Psych Admission Type (Psych Patients Only)  Admission Status Voluntary  Psychosocial Assessment  Patient Complaints None  Eye Contact None  Facial Expression Flat  Affect Appropriate to circumstance  Speech Logical/coherent  Interaction Assertive  Motor Activity Slow  Appearance/Hygiene Unremarkable  Behavior Characteristics Appropriate to situation  Mood Depressed  Thought Process  Coherency WDL  Content WDL  Delusions None reported or observed  Perception WDL  Hallucination None reported or observed  Judgment Poor  Confusion None  Danger to Self  Current suicidal ideation? Denies  Danger to Others  Danger to Others None reported or observed

## 2019-08-01 MED ORDER — TEMAZEPAM 15 MG PO CAPS
15.0000 mg | ORAL_CAPSULE | Freq: Once | ORAL | Status: AC
  Administered 2019-08-01: 15 mg via ORAL
  Filled 2019-08-01: qty 1

## 2019-08-01 NOTE — Progress Notes (Signed)
   08/01/19 2324  COVID-19 Daily Checkoff  Have you had a fever (temp > 37.80C/100F)  in the past 24 hours?  No  If you have had runny nose, nasal congestion, sneezing in the past 24 hours, has it worsened? No  COVID-19 EXPOSURE  Have you traveled outside the state in the past 14 days? No  Have you been in contact with someone with a confirmed diagnosis of COVID-19 or PUI in the past 14 days without wearing appropriate PPE? No  Have you been living in the same home as a person with confirmed diagnosis of COVID-19 or a PUI (household contact)? No  Have you been diagnosed with COVID-19? No

## 2019-08-01 NOTE — BHH Group Notes (Signed)
Pt did not attend wrap up group this evening. Pt was in bed sleeping.  

## 2019-08-01 NOTE — BHH Group Notes (Signed)
Adult Psychoeducational Group Note  Date:  08/01/2019 Time:  4:58 PM  Group Topic/Focus:  Making Healthy Choices:   The focus of this group is to help patients identify negative/unhealthy choices they were using prior to admission and identify positive/healthier coping strategies to replace them upon discharge.  Participation Level:  Minimal  Participation Quality:  Drowsy  Affect:  Flat  Cognitive:  Oriented  Insight: Improving  Engagement in Group:  Improving  Modes of Intervention:  Activity, Education and Support  Additional Comments:  Pt attends the groups but will often keep her eyes closed and at times falls asleep.   Paulino Rily 08/01/2019, 4:58 PM

## 2019-08-01 NOTE — BHH Group Notes (Signed)
[place] LCSW Group Therapy Note  08/01/2019    Type of Therapy and Topic:  Group Therapy:  Adding Supports Including Yourself  Participation Level:  Did Not Attend   Description of Group:   Patients in this group were introduced to the concept that additional supports including self-support are an essential part of recovery.  Patients listed what supports they believe they need to add to their lives to achieve their goals at discharge, and they listed such things as therapist, family, doctor, support groups, 12-step groups and service animals.   A song entitled "My Own Hero" was played and a group discussion ensued in which patients stated they could relate to the song and it inspired them to realize they have be willing to help themselves in order to succeed, because other people cannot achieve sobriety or stability for them.  "Fight For It" was played, then "I Am Enough" to encourage patients.  They discussed the impact on them and how they must remain convinced that their lives are worth the effort it takes to become sober and/or stable.  Therapeutic Goals: 1)  demonstrate the importance of being a key part of one's own support system 2)  discuss various available supports 3)  encourage patient to use music as part of their self-support and focus on goals 4)  elicit ideas from patients about supports that need to be added   Summary of Patient Progress:  The patient did not attend group.   Therapeutic Modalities:   Motivational Interviewing Activity  Ustin Cruickshank J Grossman-Orr  8:30 AM

## 2019-08-01 NOTE — [facility] Suicide Risk Assessment (Signed)
[place] INPATIENT:  Family/Significant Other Suicide Prevention Education  Suicide Prevention Education:  Education Completed; friend Baruch Gouty [phone], (name of family member/significant other) has been identified by the patient as the family member/significant other with whom the patient will be residing, and identified as the person(s) who will aid the patient in the event of a mental health crisis (suicidal ideations/suicide attempt).  With written consent from the patient, the family member/significant other has been provided the following suicide prevention education, prior to the and/or following the discharge of the patient.  Patient's daughter and friend have spoken with the patient and are adamant that if she goes home before going to rehab, she will relapse.  They were encouraged by CSW to come up with a secondary plan, should a rehab bed not be available immediately.  The suicide prevention education provided includes the following:  Suicide risk factors  Suicide prevention and interventions  National Suicide Hotline telephone number  [facility] assessment telephone number  [facility] Emergency Assistance [place] and/or Residential Mobile Crisis Unit telephone number  Request made of family/significant other to:  Remove weapons (e.g., guns, rifles, knives), all items previously/currently identified as safety concern.    Remove drugs/medications (over-the-counter, prescriptions, illicit drugs), all items previously/currently identified as a safety concern.  The family member/significant other verbalizes understanding of the suicide prevention education information provided.  The family member/significant other agrees to remove the items of safety concern listed above.  Caroline Harvey 08/01/2019, 3:41 PM

## 2019-08-01 NOTE — Progress Notes (Signed)
   08/01/19 1100  Psych Admission Type (Psych Patients Only)  Admission Status Voluntary  Psychosocial Assessment  Patient Complaints Substance abuse  Eye Contact Brief  Facial Expression Flat  Affect Appropriate to circumstance  Speech Logical/coherent  Interaction Assertive  Motor Activity Slow  Appearance/Hygiene Unremarkable  Behavior Characteristics Cooperative;Appropriate to situation  Mood Depressed;Anxious  Aggressive Behavior  Targets Self  Type of Behavior Weapon  Effect No apparent injury  Thought Process  Coherency WDL  Content WDL  Delusions None reported or observed  Perception WDL  Hallucination None reported or observed  Judgment Poor  Confusion None  Danger to Self  Current suicidal ideation? Denies  Danger to Others  Danger to Others None reported or observed

## 2019-08-01 NOTE — Progress Notes (Signed)
   08/01/19 2326  Psych Admission Type (Psych Patients Only)  Admission Status Voluntary  Psychosocial Assessment  Patient Complaints Insomnia  Eye Contact Brief  Facial Expression Flat  Affect Appropriate to circumstance  Speech Logical/coherent  Interaction Minimal  Motor Activity Slow  Appearance/Hygiene Unremarkable  Behavior Characteristics Appropriate to situation  Mood Depressed  Thought Process  Coherency WDL  Content WDL  Delusions None reported or observed  Perception WDL  Hallucination None reported or observed  Judgment Poor  Confusion None  Danger to Self  Current suicidal ideation? Denies  Danger to Others  Danger to Others None reported or observed

## 2019-08-01 NOTE — Progress Notes (Signed)
[facility] MD Progress Note  08/01/2019 1:24 PM Caroline Harvey  MRN:  [id number] Subjective:  Patient reports feeling better. She states withdrawal symptoms have decreased/abated . Denies suicidal ideations, and presents future oriented, stating that her goal is to go to a rehab at discharge. Denies medication side effects at this time. Objective: I have reviewed chart notes and have met with patient. 59 y old female, presented to [facility] on 3/3 reporting alcohol and opiate ( heroin) abuse. Reported she was drinking up to 9 24 ounce beers daily and had recently been using heroin as well. Reported depression and endorsed suicidal ideations with thoughts of overdosing. 3/4 BAL was negative.  Currently patient presents alert, attentive, calm, cooperative on approach. Describes gradual improvement since admission and states she is feeling better. Currently does not endorse significant alcohol or opiate WDL symptoms. No distal tremors or diaphoresis are note .  Denies diarrhea or vomiting , and states has been able to eat " some" of her food .  Denies medication side effects. Tolerating Librium taper well .  Limited milieu participation. No disruptive behaviors on unit.   Principal Problem: Mood disorder, drug-induced (HCC) Diagnosis: Principal Problem:   Mood disorder, drug-induced (HCC) Active Problems:   Substance induced mood disorder ([place])  Total Time spent with patient: 15 minutes  Past Psychiatric History:   Past Medical History:  Past Medical History:  Diagnosis Date  . Alcohol abuse   . Anemia   . Hypertension   . Hyponatremia 09/2014    Past Surgical History:  Procedure Laterality Date  . ANKLE SURGERY Right    x  2   Family History:  Family History  Problem Relation Age of Onset  . Hypertension Mother   . Breast cancer Paternal Aunt        ? age of onset  . Breast cancer Paternal Aunt        ? age of onset   Family Psychiatric  History:  Social History:  Social History    Substance and Sexual Activity  Alcohol Use Yes   Comment: Quit May 20 2015     Social History   Substance and Sexual Activity  Drug Use No    Social History   Socioeconomic History  . Marital status: Single    Spouse name: Not on file  . Number of children: Not on file  . Years of education: Not on file  . Highest education level: Not on file  Occupational History  . Not on file  Tobacco Use  . Smoking status: Never Smoker  . Smokeless tobacco: Never Used  Substance and Sexual Activity  . Alcohol use: Yes    Comment: Quit May 20 2015  . Drug use: No  . Sexual activity: Not on file  Other Topics Concern  . Not on file  Social History Narrative  . Not on file   Social Determinants of Health   Financial Resource Strain:   . Difficulty of Paying Living Expenses: Not on file  Food Insecurity:   . Worried About Charity fundraiser in the Last Year: Not on file  . Ran Out of Food in the Last Year: Not on file  Transportation Needs:   . Lack of Transportation (Medical): Not on file  . Lack of Transportation (Non-Medical): Not on file  Physical Activity:   . Days of Exercise per Week: Not on file  . Minutes of Exercise per Session: Not on file  Stress:   . Feeling of  Stress : Not on file  Social Connections:   . Frequency of Communication with Friends and Family: Not on file  . Frequency of Social Gatherings with Friends and Family: Not on file  . Attends Religious Services: Not on file  . Active Member of Clubs or Organizations: Not on file  . Attends Archivist Meetings: Not on file  . Marital Status: Not on file   Additional Social History:    Pain Medications: See MAR Prescriptions: See MAR Over the Counter: See MAR History of alcohol / drug use?: Yes Longest period of sobriety (when/how long): 2 months Negative Consequences of Use: Personal relationships Withdrawal Symptoms: Agitation, Irritability    Sleep: Fair/ improving   Appetite:   Fair/ improving  Current Medications: Current Facility-Administered Medications  Medication Dose Route Frequency Provider Last Rate Last Admin  . acetaminophen (TYLENOL) tablet 650 mg  650 mg Oral Q6H PRN Anike, Adaku C, NP   650 mg at 08/01/19 0855  . amLODipine (NORVASC) tablet 5 mg  5 mg Oral Daily Truth Wolaver, Myer Peer, MD   5 mg at 08/01/19 0853  . [START ON 08/02/2019] chlordiazePOXIDE (LIBRIUM) capsule 25 mg  25 mg Oral Daily Johnn Hai, MD      . chlordiazePOXIDE (LIBRIUM) capsule 25 mg  25 mg Oral Q6H PRN Johnn Hai, MD   25 mg at 07/31/19 2309  . hydrOXYzine (ATARAX/VISTARIL) tablet 25 mg  25 mg Oral Q6H PRN Johnn Hai, MD   25 mg at 07/31/19 2111  . influenza vac split quadrivalent PF (FLUARIX) injection 0.5 mL  0.5 mL Intramuscular Tomorrow-1000 Sharma Covert, MD      . multivitamin with minerals tablet 1 tablet  1 tablet Oral Daily Johnn Hai, MD   1 tablet at 08/01/19 1226  . thiamine tablet 100 mg  100 mg Oral Daily Johnn Hai, MD   100 mg at 08/01/19 1226    Lab Results: No results found for this or any previous visit (from the past 48 hour(s)).  Blood Alcohol level:  Lab Results  Component Value Date   ETH <10 07/29/2019   ETH <5 16/02/9603    Metabolic Disorder Labs: Lab Results  Component Value Date   HGBA1C 5.0 07/29/2019   MPG 96.8 07/29/2019   No results found for: PROLACTIN Lab Results  Component Value Date   CHOL 161 07/29/2019   TRIG 31 07/29/2019   HDL 96 07/29/2019   CHOLHDL 1.7 07/29/2019   VLDL 6 07/29/2019   LDLCALC 59 07/29/2019   LDLCALC 31 01/18/2008    Physical Findings: AIMS: Facial and Oral Movements Muscles of Facial Expression: None, normal Lips and Perioral Area: None, normal Jaw: None, normal Tongue: None, normal,Extremity Movements Upper (arms, wrists, hands, fingers): None, normal Lower (legs, knees, ankles, toes): None, normal, Trunk Movements Neck, shoulders, hips: None, normal, Overall Severity Severity of  abnormal movements (highest score from questions above): None, normal Incapacitation due to abnormal movements: None, normal Patient's awareness of abnormal movements (rate only patient's report): No Awareness, Dental Status Current problems with teeth and/or dentures?: No Does patient usually wear dentures?: No  CIWA:  CIWA-Ar Total: 0 COWS:  COWS Total Score: 3  Musculoskeletal: Strength & Muscle Tone: within normal limits no significant distal tremors, no diaphoresis, no restlessness or agitation Gait & Station: slow gait  Patient leans: N/A  Psychiatric Specialty Exam: Physical Exam  Review of Systems no chest pain , no shortness of breath at room air, no vomiting , no diarrhea  at this time. Denies dizziness or lightheadedness   Blood pressure (!) 135/98, pulse (!) 108, temperature 98.3 F (36.8 C), temperature source Oral, resp. rate 20, last menstrual period 12/14/2010, SpO2 98 %.There is no height or weight on file to calculate BMI.  Sitting 152/92 pulse 90  Standing  135/98, pulse 108   General Appearance: Fairly Groomed  Eye Contact:  Good  Speech:  Normal Rate  Volume:  Normal  Mood:  reports she is feeling better  Affect:  still vaguely blunted   Thought Process:  Linear and Descriptions of Associations: Intact  Orientation:  Other:  fully alert and attentive  Thought Content:  no hallucinations, no delusions expressed   Suicidal Thoughts:  No denies any suicidal or self injurious ideations, presents future oriented  Homicidal Thoughts:  No  Memory:  recent and remote grossly intact  Judgement:  Fair/ improving   Insight:  Fair  Psychomotor Activity:  no significant distal tremors or psychomotor agitation does not appear uncomfortable or in any acute distress   Concentration:  Concentration: improving  and Attention Span: improving   Recall:  Good  Fund of Knowledge:  Good  Language:  Good  Akathisia:  Negative  Handed:  Right  AIMS (if indicated):     Assets:   Desire for Improvement Resilience  ADL's:  Intact  Cognition:  WNL  Sleep:  Number of Hours: 4.25   Assessment -  61 y old female, presented to [facility] on 3/3 reporting alcohol and opiate ( heroin) abuse. Reported she was drinking up to 9 24 ounce beers daily and had recently been using heroin as well. Reported depression and endorsed suicidal ideations with thoughts of overdosing. 3/4 BAL was negative.  Patient reports gradual improvement and states she is feeling better than on admission. She denies SI and is future oriented, expressing interest in going to a rehab setting at discharge. She describes improving mood and minimizes depression at present. As noted in prior notes, she has described depression as substance induced and feels it is improving with abstinence. She is not currently on standing psychiatric medication other than BZD taper. Tolerating Valium taper well . She has a history of HTN, was started on Norvasc . Some orthostatic changes noted (* sitting pulse 90, * standing 108) but denies dizziness or lightheadedness . Have advised precautions such as getting up from bed or chair slowly, calling RN if feeling dizzy .     Treatment Plan Summary: Daily contact with patient to assess and evaluate symptoms and progress in treatment, Medication management, Plan inpatient treatment and medications as below Treatment Plan reviewed as below today 3/7  Encourage group and milieu participation Encourage efforts to work on sobriety and relapse prevention Treatment team working on disposition planning options, patient is expressing interest in going to a rehab at discharge  Continue Librium detox protocol for alcohol WDL Continue Thiamine and MVI/Folate supplementation  Continue  Norvasc 5 mgrs QDAY for HTN Encourage PO fluids. Check vitals Q 6 hours     Jenne Campus, MD 08/01/2019, 1:24 PMPatient ID: Caroline Harvey, female   DOB: June 26, 1960, 59 y.o.   MRN: [id number]

## 2019-08-01 NOTE — Progress Notes (Signed)
[place] NOVEL CORONAVIRUS (COVID-19) DAILY CHECK-OFF SYMPTOMS - answer yes or no to each - every day NO YES  Have you had a fever in the past 24 hours?  . Fever (Temp > 37.80C / 100F) X   Have you had any of these symptoms in the past 24 hours? . New Cough .  Sore Throat  .  Shortness of Breath .  Difficulty Breathing .  Unexplained Body Aches   X   Have you had any one of these symptoms in the past 24 hours not related to allergies?   . Runny Nose .  Nasal Congestion .  Sneezing   X   If you have had runny nose, nasal congestion, sneezing in the past 24 hours, has it worsened?  X   EXPOSURES - check yes or no X   Have you traveled outside the state in the past 14 days?  X   Have you been in contact with someone with a confirmed diagnosis of COVID-19 or PUI in the past 14 days without wearing appropriate PPE?  X   Have you been living in the same home as a person with confirmed diagnosis of COVID-19 or a PUI (household contact)?    X   Have you been diagnosed with COVID-19?    X              What to do next: Answered NO to all: Answered YES to anything:   Proceed with unit schedule Follow the [facility] Inpatient Flowsheet.   

## 2019-08-02 MED ORDER — NAPROXEN 500 MG PO TABS
500.0000 mg | ORAL_TABLET | Freq: Two times a day (BID) | ORAL | Status: DC | PRN
  Administered 2019-08-02: 500 mg via ORAL
  Filled 2019-08-02: qty 1

## 2019-08-02 MED ORDER — TRAZODONE HCL 50 MG PO TABS
50.0000 mg | ORAL_TABLET | Freq: Every evening | ORAL | Status: DC | PRN
  Filled 2019-08-02 (×3): qty 1

## 2019-08-02 MED ORDER — TRAZODONE HCL 50 MG PO TABS
50.0000 mg | ORAL_TABLET | Freq: Every evening | ORAL | Status: DC | PRN
  Filled 2019-08-02: qty 1

## 2019-08-02 MED ORDER — TRAZODONE HCL 50 MG PO TABS: 50.0000 mg | ORAL_TABLET | Freq: Every evening | ORAL | 0 refills | Status: DC | PRN

## 2019-08-02 MED ORDER — AMLODIPINE BESYLATE 5 MG PO TABS: 5.0000 mg | ORAL_TABLET | Freq: Every day | ORAL | 0 refills | Status: DC

## 2019-08-02 NOTE — Progress Notes (Signed)
D:  Patient denied SI and HI, contracts for safety.  Denied A/V hallucinations.  Denied pain. A:  Medications administered per MD orders.  Emotional support and encouragement given patient. R:  Safety maintained with 15 minute checks.  

## 2019-08-02 NOTE — [facility] Suicide Risk Assessment (Signed)
[facility] Discharge Suicide Risk Assessment   Principal Problem: Mood disorder, drug-induced [facility]) Discharge Diagnoses: Principal Problem:   Mood disorder, drug-induced ([place]) Active Problems:   Substance induced mood disorder ([place])   Total Time spent with patient: 30 minutes  Musculoskeletal: Strength & Muscle Tone: within normal limits Gait & Station: slow gait  Patient leans: N/A  Psychiatric Specialty Exam: Review of Systemsno headache, no chest pain, no shortness of breath, no cough, reports some residual diffuse aches which she attributes to lingering opiate WDL, but states that in general she feels better  Blood pressure 136/81, pulse 100, temperature 98 F (36.7 C), temperature source Oral, resp. rate 20, last menstrual period 12/14/2010, SpO2 100 %.There is no height or weight on file to calculate BMI.  General Appearance: improving grooming   Eye Contact::  Fair  Speech:  Normal Rate409  Volume:  Normal  Mood:  reports " my mood is ok"  Affect:  vaguely blunted and irritable  Thought Process:  Linear and Descriptions of Associations: Intact  Orientation:  Full (Time, Place, and Person)  Thought Content:  denies hallucinations, no delusions, not internally preoccupied  Suicidal Thoughts:  No denies suicidal or self injurious ideations, denies homicidal or violent ideations.  Homicidal Thoughts:  No  Memory:  recent and remote fair   Judgement:  Other:  improving   Insight:  fair- improving   Psychomotor Activity:  Decreased no tremors, no diaphoresis, no restlessness or agitation  Concentration:  Fair  Recall:  [employer] of Knowledge:Fair  Language: Fair  Akathisia:  Negative  Handed:  Right  AIMS (if indicated):     Assets:  Desire for Improvement Resilience  Sleep:  Number of Hours: 5  Cognition: WNL  ADL's:  Intact   Mental Status Per Nursing Assessment::   On Admission:  NA  Demographic Factors:  Single, has one adult daughter, lives alone , employed as  CNA  Loss Factors: Substance abuse, identifies opiate( heroin) and alcohol as substances of choice . Financial difficulties .  Historical Factors: History of alcohol and opiate abuse. Denies history of depression. Denies history of suicide attempts.  Risk Reduction Factors:   Employed and Positive coping skills or problem solving skills  Continued Clinical Symptoms:  Today patient presents somewhat tired, but becomes alert during session. She is oriented x 3. She reports her mood is "OK", states she is in a good mood today , and currently denies depression. Affect is vaguely irritable but improves partially during session. No thought disorder is noted, denies suicidal or self injurious ideations, denies homicidal or violent ideations, no hallucinations, no delusions, future oriented . Currently reports some lingering symptoms of opiate WDL ( mild aches), but denies vomiting , denies diarrhea, tolerating PO intake well without difficulties. Currently presents without tremors or diaphoresis, calm and in no acute distress, BP 136/81, pulse 100 Denies medication side effects. Will discontinue Librium detox protocol, last dose scheduled for tomorrow AM as not symptoms of alcohol WDL and reports feeling tired, drowsy. No disruptive or agitated behaviors on unit, some group participation.   Cognitive Features That Contribute To Risk:  No gross cognitive deficits noted upon discharge. Is alert , attentive, and oriented x 3    Suicide Risk:  Mild:  Suicidal ideation of limited frequency, intensity, duration, and specificity.  There are no identifiable plans, no associated intent, mild dysphoria and related symptoms, good self-control (both objective and subjective assessment), few other risk factors, and identifiable protective factors, including available and  accessible social support.  Follow-up Information    Services, [facility]. Go on 08/03/2019.   Why: Accepted for admission screening on  Tuesday, 08/03/2019 at 7:45am. Please be sure to bring your 14 day supply of medications and 30 day presciptions. Also be sure to bring any discharge paperwork from this hospitalization.  Contact information: [address] [place] [address] [phone]        [facility]. Go to.   Why: Upon completion of your residential treatment program, please follow up for medication management and therapy services. Walk-in hours are Monday-Friday from 8:00am-5:00pm.  Contact information: [address] [place] [address] [phone]           Plan Of Care/Follow-up recommendations:  Activity:  as tolerated  Diet:  heart healthy Tests:  NA Other:  See below  Patient is discharging tomorrow early in AM to go to [facility]) for admission screening at 7,45 AM. Reports she is motivated in this next phase of treatment. Plans to return home and to go to [place] meetings after she is discharged from rehab.  She reports she has an established PCP, Dr. Darcella Cheshire, for medical management as needed   Jenne Campus, MD 08/02/2019, 5:28 PM

## 2019-08-02 NOTE — [facility] Group Notes (Signed)
LCSW Group Therapy Note 08/02/2019 3:54 PM  Type of Therapy and Topic: Group Therapy: Overcoming Obstacles  Participation Level: Active  Description of Group:  In this group patients will be encouraged to explore what they see as obstacles to their own wellness and recovery. They will be guided to discuss their thoughts, feelings, and behaviors related to these obstacles. The group will process together ways to cope with barriers, with attention given to specific choices patients can make. Each patient will be challenged to identify changes they are motivated to make in order to overcome their obstacles. This group will be process-oriented, with patients participating in exploration of their own experiences as well as giving and receiving support and challenge from other group members.  Therapeutic Goals: 1. Patient will identify personal and current obstacles as they relate to admission. 2. Patient will identify barriers that currently interfere with their wellness or overcoming obstacles.  3. Patient will identify feelings, thought process and behaviors related to these barriers. 4. Patient will identify two changes they are willing to make to overcome these obstacles:   Summary of Patient Progress  Caroline Harvey was engaged and participated throughout the group session.  Caroline Harvey shared that her main obstacle is her family. She reports she does not associate with them due to her fear of them judging her substance abuse. She reports that her family is very judgmental, which is why she plans to not share her recovery with them as well.    Therapeutic Modalities:  Cognitive Behavioral Therapy Solution Focused Therapy Motivational Interviewing Relapse Prevention Therapy   Theresa Duty Clinical Social Worker

## 2019-08-02 NOTE — Progress Notes (Signed)
ADULT GRIEF GROUP NOTE:  Spiritual care group on grief and loss facilitated by chaplain Jerene Pitch  Group Goal:  Support / Education around grief and loss  Members engage in facilitated group support and psycho-social education.  Group Description:  Following introductions and group rules, group members engaged in facilitated group dialog and support around topic of loss, with particular support around experiences of loss in their lives. Group Identified types of loss (relationships / self / things) and identified patterns, circumstances, and changes that precipitate losses. Reflected on thoughts / feelings around loss, normalized grief responses, and recognized variety in grief experience.  Patient Progress:

## 2019-08-02 NOTE — Discharge Summary (Addendum)
Physician Discharge Summary Note  Patient:  Caroline Harvey is an 59 y.o., female MRN:  [id number] DOB:  [birth year] Patient phone:  [phone] (home)  Patient address:   [address] [place] [address],  Total Time spent with patient: 15 minutes  Date of Admission:  07/29/2019 Date of Discharge: 08/03/19  Reason for Admission:  Heroin and alcohol dependence with suicidal ideation  Principal Problem: Mood disorder, drug-induced [facility]) Discharge Diagnoses: Principal Problem:   Mood disorder, drug-induced ([place]) Active Problems:   Substance induced mood disorder ([place])   Past Psychiatric History: History of alcohol and cocaine use disorders with prior hospitalizations and rehab.  Past Medical History:  Past Medical History:  Diagnosis Date  . Alcohol abuse   . Anemia   . Hypertension   . Hyponatremia 09/2014    Past Surgical History:  Procedure Laterality Date  . ANKLE SURGERY Right    x  2   Family History:  Family History  Problem Relation Age of Onset  . Hypertension Mother   . Breast cancer Paternal Aunt        ? age of onset  . Breast cancer Paternal Aunt        ? age of onset   Family Psychiatric  History: Denies Social History:  Social History   Substance and Sexual Activity  Alcohol Use Yes   Comment: Quit May 20 2015     Social History   Substance and Sexual Activity  Drug Use No    Social History   Socioeconomic History  . Marital status: Single    Spouse name: Not on file  . Number of children: Not on file  . Years of education: Not on file  . Highest education level: Not on file  Occupational History  . Not on file  Tobacco Use  . Smoking status: Never Smoker  . Smokeless tobacco: Never Used  Substance and Sexual Activity  . Alcohol use: Yes    Comment: Quit May 20 2015  . Drug use: No  . Sexual activity: Not on file  Other Topics Concern  . Not on file  Social History Narrative  . Not on file   Social Determinants  of Health   Financial Resource Strain:   . Difficulty of Paying Living Expenses: Not on file  Food Insecurity:   . Worried About Charity fundraiser in the Last Year: Not on file  . Ran Out of Food in the Last Year: Not on file  Transportation Needs:   . Lack of Transportation (Medical): Not on file  . Lack of Transportation (Non-Medical): Not on file  Physical Activity:   . Days of Exercise per Week: Not on file  . Minutes of Exercise per Session: Not on file  Stress:   . Feeling of Stress : Not on file  Social Connections:   . Frequency of Communication with Friends and Family: Not on file  . Frequency of Social Gatherings with Friends and Family: Not on file  . Attends Religious Services: Not on file  . Active Member of Clubs or Organizations: Not on file  . Attends Archivist Meetings: Not on file  . Marital Status: Not on file    Hospital Course:  From admission H&P 07/29/19: Caroline Harvey is a 59 y.o female who presents voluntarily to [facility] accompanied by a friend with complaints of suicidal ideation. Pt states "I don't love me anymore because I'm doing things that I shouldn't be doing" Pt  reports that she has used about $100 worth of heroin and drank between 6-9 240z beers including some wine. Pt reports she started abusing substances last year summer due to family conflicts, stress and low self esteem. Pt endorses suicidal ideation with plans to overdose on gabapentin which she can get on the streets due to not being loved enough by others. Pt reports symptoms of depression to include anxiety, hopelessness, helplessness, irritability and worthlessness. Pt reports history of suicidal attempts. She denies HI, and self harm. Pt states she sees something moving on the wall. Pt denies access to guns but access to knives. She denies any history of abuse or trauma. She reports family history of mental illness. Pt reports she was at a [facility] for rehab services 3 years ago but relapsed  after being 2 years sober due to her sponsor moving out of state. Pt reports she sees no therapist, psychiatrist and takes no psychiatric medication. Pt denies any history of violence or pending legal charges. She states she sleeps 5 hours currently but her normal is 8 hours. She states her appetite is fair.   Caroline Harvey was admitted for alcohol and cocaine use disorder with suicidal ideation. She remained on the [facility] unit for five days. She was started on Librium CIWA protocol for alcohol withdrawal. Trazodone was started for sleep and Norvasc for HTN. She participated in group therapy on the unit. She responded well to treatment. She has shown improved mood, affect, sleep, and appetite. She requested referrals to inpatient rehab and has been accepted for screening at [facility]. She responded well to treatment with no adverse effects reported. She has shown improved mood, affect, sleep, and interaction. She denies any SI/HI/AVH and contracts for safety. She is discharging on the medications listed below. She agrees to follow up at [place] (see below). Patient is provided with prescriptions and medication samples upon discharge. She is discharging to [facility] rehab via [employer].  Physical Findings: AIMS: Facial and Oral Movements Muscles of Facial Expression: None, normal Lips and Perioral Area: None, normal Jaw: None, normal Tongue: None, normal,Extremity Movements Upper (arms, wrists, hands, fingers): None, normal Lower (legs, knees, ankles, toes): None, normal, Trunk Movements Neck, shoulders, hips: None, normal, Overall Severity Severity of abnormal movements (highest score from questions above): None, normal Incapacitation due to abnormal movements: None, normal Patient's awareness of abnormal movements (rate only patient's report): No Awareness, Dental Status Current problems with teeth and/or dentures?: No Does patient usually wear dentures?: No  CIWA:  CIWA-Ar Total:  0 COWS:  COWS Total Score: 3  Musculoskeletal: Strength & Muscle Tone: within normal limits Gait & Station: normal Patient leans: N/A  Psychiatric Specialty Exam: Physical Exam  Nursing note and vitals reviewed. Constitutional: She is oriented to person, place, and time. She appears well-developed and well-nourished.  Cardiovascular: Normal rate.  Respiratory: Effort normal.  Neurological: She is alert and oriented to person, place, and time.    Review of Systems  Constitutional: Negative.   Respiratory: Negative for cough and shortness of breath.   Psychiatric/Behavioral: Negative for agitation, behavioral problems, dysphoric mood, hallucinations, self-injury, sleep disturbance and suicidal ideas. The patient is not nervous/anxious and is not hyperactive.     Blood pressure 136/81, pulse 100, temperature 98 F (36.7 C), temperature source Oral, resp. rate 20, last menstrual period 12/14/2010, SpO2 100 %.There is no height or weight on file to calculate BMI.  See MD's discharge SRA      Has this patient used any  form of tobacco in the last 30 days? (Cigarettes, Smokeless Tobacco, Cigars, and/or Pipes)  No  Blood Alcohol level:  Lab Results  Component Value Date   ETH <10 07/29/2019   ETH <5 0000000    Metabolic Disorder Labs:  Lab Results  Component Value Date   HGBA1C 5.0 07/29/2019   MPG 96.8 07/29/2019   No results found for: PROLACTIN Lab Results  Component Value Date   CHOL 161 07/29/2019   TRIG 31 07/29/2019   HDL 96 07/29/2019   CHOLHDL 1.7 07/29/2019   VLDL 6 07/29/2019   LDLCALC 59 07/29/2019   LDLCALC 31 01/18/2008    See Psychiatric Specialty Exam and Suicide Risk Assessment completed by Attending Physician prior to discharge.  Discharge destination:  [facility]  Is patient on multiple antipsychotic therapies at discharge:  No   Has Patient had three or more failed trials of antipsychotic monotherapy by history:  No  Recommended Plan  for Multiple Antipsychotic Therapies: NA  Discharge Instructions    Discharge instructions   Complete by: As directed    Patient is instructed to take all prescribed medications as recommended. Report any side effects or adverse reactions to your outpatient psychiatrist. Patient is instructed to abstain from alcohol and illegal drugs while on prescription medications. In the event of worsening symptoms, patient is instructed to call the crisis hotline, 911, or go to the nearest emergency department for evaluation and treatment.     Allergies as of 08/02/2019   No Known Allergies     Medication List    STOP taking these medications   albuterol 108 (90 Base) MCG/ACT inhaler Commonly known as: VENTOLIN HFA   furosemide 20 MG tablet Commonly known as: LASIX     TAKE these medications     Indication  amLODipine 5 MG tablet Commonly known as: NORVASC Take 1 tablet (5 mg total) by mouth daily. Start taking on: August 03, 2019  Indication: High Blood Pressure Disorder   traZODone 50 MG tablet Commonly known as: DESYREL Take 1 tablet (50 mg total) by mouth at bedtime as needed and may repeat dose one time if needed for sleep.  Indication: Trouble Sleeping      Follow-up Information    Services, [facility]. Go on 08/03/2019.   Why: Accepted for admission screening on Tuesday, 08/03/2019 at 7:45am. Please be sure to bring your 14 day supply of medications and 30 day presciptions. Also be sure to bring any discharge paperwork from this hospitalization.  Contact information: [address] [place] [address] [phone]        [facility]. Go to.   Why: Upon completion of your residential treatment program, please follow up for medication management and therapy services. Walk-in hours are Monday-Friday from 8:00am-5:00pm.  Contact information: [address] [place] [address] [phone]           Follow-up recommendations: Activity as tolerated. Diet  as recommended by primary care physician. Keep all scheduled follow-up appointments as recommended.   Comments:   Patient is instructed to take all prescribed medications as recommended. Report any side effects or adverse reactions to your outpatient psychiatrist. Patient is instructed to abstain from alcohol and illegal drugs while on prescription medications. In the event of worsening symptoms, patient is instructed to call the crisis hotline, 911, or go to the nearest emergency department for evaluation and treatment.  Signed: Connye Burkitt, NP 08/02/2019, 3:43 PM   Patient seen, Suicide Assessment Completed.  Disposition  Plan Reviewed

## 2019-08-02 NOTE — Progress Notes (Signed)
Recreation Therapy Notes  Date:  3.8.21 Time: 0930 Location: [address] Dayroom  Group Topic: Stress Management  Goal Area(s) Addresses:  Patient will identify positive stress management techniques. Patient will identify benefits of using stress management post d/c.  Intervention: Stress Management  Activity :  Meditation.  LRT played a meditation on being resilient in the face of adversity.  Patients were to focus and listen to the meditation as it played to fully engage in activity.    Education:  Stress Management, Discharge Planning.   Education Outcome: Acknowledges Education  Clinical Observations/Feedback:  Pt did not attend activity.    Victorino Sparrow, LRT/CTRS         Victorino Sparrow A 08/02/2019 11:51 AM

## 2019-08-02 NOTE — Progress Notes (Signed)
   08/02/19 2300  Psych Admission Type (Psych Patients Only)  Admission Status Voluntary  Psychosocial Assessment  Patient Complaints Insomnia  Eye Contact Brief  Facial Expression Flat  Affect Appropriate to circumstance  Speech Logical/coherent  Interaction Minimal  Motor Activity Slow  Appearance/Hygiene Unremarkable  Behavior Characteristics Cooperative  Mood Depressed  Thought Process  Coherency WDL  Content WDL  Delusions None reported or observed  Perception WDL  Hallucination None reported or observed  Judgment Poor  Confusion None  Danger to Self  Current suicidal ideation? Denies  Danger to Others  Danger to Others None reported or observed

## 2019-08-02 NOTE — BHH Counselor (Signed)
Patient has been offered a residential treatment intake screening for tomorrow (03/09) at [place] at 7:45am. Patient is aware she needs to call today and speak to Ms.June, intake coordinator today to finalize the process.   Patient will need to discharge tomorrow at 7:00am in order to arrive for her 7:45am screening.  Patient will need a 14 day supply of medications and a prescription refill.  Stephanie Acre, MSW, [place] Social Worker [facility]  [phone]

## 2019-08-02 NOTE — Progress Notes (Signed)
Psychoeducational Group Note  Date:  08/02/2019 Time:  2155  Group Topic/Focus:  Wrap-Up Group:   The focus of this group is to help patients review their daily goal of treatment and discuss progress on daily workbooks.  Participation Level: Did Not Attend  Participation Quality:  Not Applicable  Affect:  Not Applicable  Cognitive:  Not Applicable  Insight:  Not Applicable  Engagement in Group: Not Applicable  Additional Comments:  The patient did not attend group this evening.   Archie Balboa S 08/02/2019, 9:55 PM

## 2019-08-03 ENCOUNTER — Emergency Department (HOSPITAL_COMMUNITY)
Admission: EM | Admit: 2019-08-03 | Discharge: 2019-08-03 | Discharge status: Against Medical Advice | Payer: Self-pay | Attending: Emergency Medicine | Admitting: Emergency Medicine

## 2019-08-03 ENCOUNTER — Other Ambulatory Visit: Payer: Self-pay

## 2019-08-03 ENCOUNTER — Encounter (HOSPITAL_COMMUNITY): Payer: Self-pay | Admitting: Emergency Medicine

## 2019-08-03 DIAGNOSIS — Z5321 Procedure and treatment not carried out due to patient leaving prior to being seen by health care provider: Secondary | ICD-10-CM | POA: Insufficient documentation

## 2019-08-03 DIAGNOSIS — Z01818 Encounter for other preprocedural examination: Secondary | ICD-10-CM | POA: Insufficient documentation

## 2019-08-03 LAB — CBC
HCT: 40.2 % (ref 36.0–46.0)
Hemoglobin: 13.9 g/dL (ref 12.0–15.0)
MCH: 31.1 pg (ref 26.0–34.0)
MCHC: 34.6 g/dL (ref 30.0–36.0)
MCV: 89.9 fL (ref 80.0–100.0)
Platelets: 265 K/uL (ref 150–400)
RBC: 4.47 MIL/uL (ref 3.87–5.11)
RDW: 13.5 % (ref 11.5–15.5)
WBC: 3.3 K/uL — ABNORMAL LOW (ref 4.0–10.5)
nRBC: 0 % (ref 0.0–0.2)

## 2019-08-03 LAB — COMPREHENSIVE METABOLIC PANEL
ALT: 10 U/L (ref 0–44)
AST: 19 U/L (ref 15–41)
Albumin: 3.8 g/dL (ref 3.5–5.0)
Alkaline Phosphatase: 69 U/L (ref 38–126)
Anion gap: 14 (ref 5–15)
BUN: 14 mg/dL (ref 6–20)
CO2: 16 mmol/L — ABNORMAL LOW (ref 22–32)
Calcium: 9.2 mg/dL (ref 8.9–10.3)
Chloride: 90 mmol/L — ABNORMAL LOW (ref 98–111)
Creatinine, Ser: 1.17 mg/dL — ABNORMAL HIGH (ref 0.44–1.00)
GFR calc Af Amer: 60 mL/min — ABNORMAL LOW (ref >60)
GFR calc non Af Amer: 52 mL/min — ABNORMAL LOW (ref >60)
Glucose, Bld: 113 mg/dL — ABNORMAL HIGH (ref 70–99)
Potassium: 3.8 mmol/L (ref 3.5–5.1)
Sodium: 120 mmol/L — ABNORMAL LOW (ref 135–145)
Total Bilirubin: 0.7 mg/dL (ref 0.3–1.2)
Total Protein: 7.3 g/dL (ref 6.5–8.1)

## 2019-08-03 LAB — ETHANOL: Alcohol, Ethyl (B): <10 mg/dL

## 2019-08-03 NOTE — Progress Notes (Signed)
Nurs Dischg Note:  D:Patient denies SI/HI/AVH at this time. Pt appears calm and cooperative, and no distress noted.  A: All Personal items in locker returned to pt. Pt given AVS/ SRA / [facility]/ Copy prescriptions with samples Pt escorted to the lobby to wait for ride to [facility]. Pt given morning meds  R:  Pt States she will comply with outpatient / other services, and take MEDS as prescribed.

## 2019-08-03 NOTE — Progress Notes (Signed)
  [facility] Adult Case Management Discharge Plan :  Will you be returning to the same living situation after discharge:  No. Patient discharged to [facility] for substance use treatment.  At discharge, do you have transportation home?: Yes,  patient was transported via [employer] Do you have the ability to pay for your medications: No.  Release of information consent forms completed and in the chart;  Patient's signature needed at discharge.  Patient to Follow up at: Follow-up Information    [facility]. Go on 08/03/2019.   Why: Accepted for admission screening on Tuesday, 08/03/2019 at 7:45am. Please be sure to bring your 14 day supply of medications and 30 day presciptions. Also be sure to bring any discharge paperwork from this hospitalization.  Contact information: [address] [place] [address] [phone]        [facility]. Go to.   Why: Upon completion of your residential treatment program, please follow up for medication management and therapy services. Walk-in hours are Monday-Friday from 8:00am-5:00pm.  Contact information: [place] [address] [phone]           Next level of care provider has access to [place] and Suicide Prevention discussed: Yes,  with the patient's friend  Have you used any form of tobacco in the last 30 days? (Cigarettes, Smokeless Tobacco, Cigars, and/or Pipes): Yes  Has patient been referred to the Quitline?: Patient refused referral  Patient has been referred for addiction treatment: Yes  Caroline Harvey, [place] 08/03/2019, 9:02 AM

## 2019-08-03 NOTE — ED Triage Notes (Signed)
Pt states she went to [facility] today but they would not let her stay until she got paperwork. Last drink was 2-3 weeks ago.

## 2019-08-31 ENCOUNTER — Telehealth (INDEPENDENT_AMBULATORY_CARE_PROVIDER_SITE_OTHER): Payer: Self-pay | Admitting: Primary Care

## 2019-10-11 ENCOUNTER — Encounter (HOSPITAL_COMMUNITY): Payer: Self-pay | Admitting: Family

## 2019-10-11 ENCOUNTER — Inpatient Hospital Stay (HOSPITAL_COMMUNITY)
Admission: AD | Admit: 2019-10-11 | Discharge: 2019-10-15 | DRG: 885 | Disposition: A | Discharge status: Home / Self Care | Payer: 59 | Attending: Psychiatry | Admitting: Psychiatry

## 2019-10-11 ENCOUNTER — Other Ambulatory Visit: Payer: Self-pay

## 2019-10-11 DIAGNOSIS — Z20822 Contact with and (suspected) exposure to covid-19: Secondary | ICD-10-CM | POA: Diagnosis present

## 2019-10-11 DIAGNOSIS — F1120 Opioid dependence, uncomplicated: Secondary | ICD-10-CM | POA: Diagnosis present

## 2019-10-11 DIAGNOSIS — F1020 Alcohol dependence, uncomplicated: Secondary | ICD-10-CM | POA: Diagnosis present

## 2019-10-11 DIAGNOSIS — F1420 Cocaine dependence, uncomplicated: Secondary | ICD-10-CM | POA: Diagnosis present

## 2019-10-11 DIAGNOSIS — F1994 Other psychoactive substance use, unspecified with psychoactive substance-induced mood disorder: Secondary | ICD-10-CM | POA: Diagnosis present

## 2019-10-11 DIAGNOSIS — R45851 Suicidal ideations: Secondary | ICD-10-CM | POA: Diagnosis present

## 2019-10-11 DIAGNOSIS — G4700 Insomnia, unspecified: Secondary | ICD-10-CM | POA: Diagnosis present

## 2019-10-11 DIAGNOSIS — Z8249 Family history of ischemic heart disease and other diseases of the circulatory system: Secondary | ICD-10-CM

## 2019-10-11 DIAGNOSIS — Z803 Family history of malignant neoplasm of breast: Secondary | ICD-10-CM

## 2019-10-11 DIAGNOSIS — I10 Essential (primary) hypertension: Secondary | ICD-10-CM | POA: Diagnosis present

## 2019-10-11 DIAGNOSIS — F332 Major depressive disorder, recurrent severe without psychotic features: Principal | ICD-10-CM | POA: Diagnosis present

## 2019-10-11 DIAGNOSIS — F419 Anxiety disorder, unspecified: Secondary | ICD-10-CM | POA: Diagnosis present

## 2019-10-11 LAB — CBC
HCT: 40.9 % (ref 36.0–46.0)
Hemoglobin: 12.9 g/dL (ref 12.0–15.0)
MCH: 30 pg (ref 26.0–34.0)
MCHC: 31.5 g/dL (ref 30.0–36.0)
MCV: 95.1 fL (ref 80.0–100.0)
Platelets: 229 10*3/uL (ref 150–400)
RBC: 4.3 MIL/uL (ref 3.87–5.11)
RDW: 15 % (ref 11.5–15.5)
WBC: 3.7 10*3/uL — ABNORMAL LOW (ref 4.0–10.5)
nRBC: 0 % (ref 0.0–0.2)

## 2019-10-11 LAB — COMPREHENSIVE METABOLIC PANEL
ALT: 9 U/L (ref 0–44)
AST: 19 U/L (ref 15–41)
Albumin: 4.1 g/dL (ref 3.5–5.0)
Alkaline Phosphatase: 73 U/L (ref 38–126)
Anion gap: 9 (ref 5–15)
BUN: 18 mg/dL (ref 6–20)
CO2: 26 mmol/L (ref 22–32)
Calcium: 9.1 mg/dL (ref 8.9–10.3)
Chloride: 96 mmol/L — ABNORMAL LOW (ref 98–111)
Creatinine, Ser: 1.05 mg/dL — ABNORMAL HIGH (ref 0.44–1.00)
GFR calc Af Amer: >60 mL/min (ref >60)
GFR calc non Af Amer: 59 mL/min — ABNORMAL LOW (ref >60)
Glucose, Bld: 107 mg/dL — ABNORMAL HIGH (ref 70–99)
Potassium: 4.7 mmol/L (ref 3.5–5.1)
Sodium: 131 mmol/L — ABNORMAL LOW (ref 135–145)
Total Bilirubin: 0.7 mg/dL (ref 0.3–1.2)
Total Protein: 7.8 g/dL (ref 6.5–8.1)

## 2019-10-11 LAB — SARS CORONAVIRUS 2 BY RT PCR (HOSPITAL ORDER, PERFORMED IN ~~LOC~~ HOSPITAL LAB): SARS Coronavirus 2: NEGATIVE

## 2019-10-11 LAB — ETHANOL: Alcohol, Ethyl (B): <10 mg/dL (ref <10)

## 2019-10-11 MED ORDER — ALUM & MAG HYDROXIDE-SIMETH 200-200-20 MG/5ML PO SUSP: 30.0000 mL | ORAL | Status: DC | PRN

## 2019-10-11 MED ORDER — ACETAMINOPHEN 325 MG PO TABS
650.0000 mg | ORAL_TABLET | Freq: Four times a day (QID) | ORAL | Status: DC | PRN
  Administered 2019-10-11 – 2019-10-12 (×2): 650 mg via ORAL
  Filled 2019-10-11 (×2): qty 2

## 2019-10-11 MED ORDER — TRAZODONE HCL 50 MG PO TABS
50.0000 mg | ORAL_TABLET | Freq: Every evening | ORAL | Status: DC | PRN
  Administered 2019-10-11: 50 mg via ORAL
  Filled 2019-10-11: qty 1

## 2019-10-11 MED ORDER — PANTOPRAZOLE SODIUM 40 MG PO TBEC
40.0000 mg | DELAYED_RELEASE_TABLET | Freq: Every day | ORAL | Status: AC
  Administered 2019-10-11 – 2019-10-13 (×3): 40 mg via ORAL
  Filled 2019-10-11 (×3): qty 1

## 2019-10-11 MED ORDER — MAGNESIUM HYDROXIDE 400 MG/5ML PO SUSP: 30.0000 mL | Freq: Every day | ORAL | Status: DC | PRN

## 2019-10-11 NOTE — BH Assessment (Signed)
Patient current in [facility] OBS.Per Priscille Loveless, NP, patient meets inpatient criteria. Patient referred to the following hospitals for consideration of a bed:  [facility] Details    [facility] Details    [facility] Details    [facility] Details    [facility] Details    [facility] Details    [facility] Details    [facility] Details    [facility] Details    [facility] Details    [facility] Details    [facility] Details    [facility] Details    [facility] Details    [place] Details    [facility] Details    [facility] Details    [facility] Details    [facility] Details    [facility] Details    [facility] Details    [facility]

## 2019-10-11 NOTE — Progress Notes (Signed)
   10/11/19 1530  Psych Admission Type (Psych Patients Only)  Admission Status Voluntary  Psychosocial Assessment  Patient Complaints Anxiety;Depression  Eye Contact Brief  Facial Expression Flat  Affect Appropriate to circumstance  Speech Logical/coherent  Interaction Minimal  Motor Activity Other (Comment) (WNL)  Appearance/Hygiene Unremarkable  Behavior Characteristics Cooperative;Appropriate to situation  Mood Pleasant  Thought Process  Coherency WDL  Content WDL  Delusions None reported or observed  Perception WDL  Hallucination None reported or observed  Judgment Poor  Confusion WDL  Danger to Self  Current suicidal ideation? Denies  Danger to Others  Danger to Others None reported or observed  D: Pt denies SI/HI/AV hallucinations. Pt is pleasant and cooperative. Patient skin assessed and there was some discoloration to bilateral lower legs. She has noted bruising to her R upper arm, L breast. L lower arm.  A: Pt was offered support and encouragement.  Q 15 minute checks were done for safety.  R: Pt has no complaints.Pt receptive to treatment and safety maintained on unit.

## 2019-10-11 NOTE — Progress Notes (Signed)
Patient ID: Caroline Harvey, female   DOB: January 14, 1961, 59 y.o.   MRN: [id number] Pt A&O x 4, no distress noted, calm & cooperative, resting at present.  Denies SI, HI or AVH, monitoring for safety.

## 2019-10-11 NOTE — BH Assessment (Signed)
Assessment Note  Caroline Harvey is an 59 y.o. female that presents this date requesting assistance with ongoing SA issues. Patient denies any S/I, H/I or AVH. Patient denies any prior attempts or gestures at self harm. Patient is seen as a walk-in at [facility] reporting daily alcohol use stating she drinks everyday between 4 to 6 twenty four once beers with last use on 10/10/19 when she reported she "drank a few beers." Patient also reports daily Heroin use for the last three months reporting she uses various amounts of that substance with last use prior to arrival when she reported she "snorted about a tenth of a gram." Patient denies any current withdrawals. Patient was last seen on 07/28/19 when she presented with similar symptoms and was observed overnight and discharged with OP services for follow up. Patient denies following up with those providers due to ongoing SA issues. Patient reports current passive SI although denies any plan or intent. Patient denies any history of self harm or prior attempts or gestures. Patient reports ongoing stressors to include: family issues and low self-esteem. Patient states she feels her family does not support or care for her. Patient reports no history of abuse/trauma but reports family history of substance abuse and mental health issues. Patient reports symptoms of depression to include: worthlessness, hopelessness and irritability. Patient reports she went to a SA program 3 years ago and was sober for 2 years afterwards. Patient reports no providers and no medications past or present. Patient presents very depressed this date. Patient is oriented x 4. Patient is requesting assistance with ongoing SA issues. Patient states she currently resides with multiple roommates that are also in active use. Patient's memory is intact and thoughts organized. Patient does not appear to be responding to internal stimuli.   Diagnosis: MDD recurrent without psychotic features, severe, Heroin  use, Alcohol abuse   Past Medical History:  Past Medical History:  Diagnosis Date  . Alcohol abuse   . Anemia   . Hypertension   . Hyponatremia 09/2014    Past Surgical History:  Procedure Laterality Date  . ANKLE SURGERY Right    x  2    Family History:  Family History  Problem Relation Age of Onset  . Hypertension Mother   . Breast cancer Paternal Aunt        ? age of onset  . Breast cancer Paternal Aunt        ? age of onset    Social History:  reports that she has never smoked. She has never used smokeless tobacco. She reports current alcohol use. She reports that she does not use drugs.  Additional Social History:  Alcohol / Drug Use Pain Medications: See MAR Prescriptions: See MAR Over the Counter: See MAR History of alcohol / drug use?: Yes Substance #1 Name of Substance 1: Heroin 1 - Age of First Use: 36 1 - Amount (size/oz): Varies 1 - Frequency: Varies 1 - Duration: Ongoing 1 - Last Use / Amount: 10/11/19 unknown amount Substance #2 Name of Substance 2: Alcohol 2 - Age of First Use: 18 2 - Amount (size/oz): Amounts vary 2 - Frequency: Varies 2 - Duration: Ongoing 2 - Last Use / Amount: 10/10/19 "a few beers"  CIWA: CIWA-Ar BP: 109/65 Pulse Rate: 80 COWS:    Allergies: No Known Allergies  Home Medications: (Not in a hospital admission)   OB/GYN Status:  Patient's last menstrual period was 12/14/2010.  General Assessment Data Location of Assessment: [facility] Assessment Services TTS  Assessment: In system Is this a Tele or Face-to-Face Assessment?: Face-to-Face Is this an Initial Assessment or a Re-assessment for this encounter?: Initial Assessment Patient Accompanied by:: N/A Language Other than English: No Living Arrangements: Other (Comment) What gender do you identify as?: Female Marital status: Single Maiden name: Mcginn Pregnancy Status: No Living Arrangements: Non-relatives/Friends Can pt return to current living arrangement?:  Yes Admission Status: Voluntary Is patient capable of signing voluntary admission?: Yes Referral Source: Self/Family/Friend Insurance type: [place] Screening Exam ([place]) Medical Exam completed: Yes  Crisis Care Plan Living Arrangements: Non-relatives/Friends Legal Guardian: (NA) Name of Psychiatrist: None Name of Therapist: None  Education Status Is patient currently in school?: No Is the patient employed, unemployed or receiving disability?: Unemployed  Risk to self with the past 6 months Suicidal Ideation: No Has patient been a risk to self within the past 6 months prior to admission? : No Suicidal Intent: No Has patient had any suicidal intent within the past 6 months prior to admission? : No Is patient at risk for suicide?: No Suicidal Plan?: No Has patient had any suicidal plan within the past 6 months prior to admission? : No Access to Means: No What has been your use of drugs/alcohol within the last 12 months?: Current use Previous Attempts/Gestures: No How many times?: 0 Other Self Harm Risks: (Excessive SA use) Triggers for Past Attempts: (NA) Intentional Self Injurious Behavior: None Family Suicide History: No Recent stressful life event(s): Other (Comment)(Excessive SA use) Persecutory voices/beliefs?: No Depression: Yes Depression Symptoms: Feeling worthless/self pity Substance abuse history and/or treatment for substance abuse?: Yes Suicide prevention information given to non-admitted patients: Not applicable  Risk to Others within the past 6 months Homicidal Ideation: No Does patient have any lifetime risk of violence toward others beyond the six months prior to admission? : No Thoughts of Harm to Others: No Current Homicidal Intent: No Current Homicidal Plan: No Access to Homicidal Means: No Identified Victim: n History of harm to others?: No Assessment of Violence: None Noted Violent Behavior Description: n Does patient  have access to weapons?: No Criminal Charges Pending?: No Does patient have a court date: No Is patient on probation?: No  Psychosis Hallucinations: None noted Delusions: None noted  Mental Status Report Appearance/Hygiene: Unremarkable Eye Contact: Fair Motor Activity: Freedom of movement Speech: Logical/coherent Level of Consciousness: Alert Mood: Pleasant Affect: Appropriate to circumstance Anxiety Level: Minimal Thought Processes: Coherent, Relevant Judgement: Partial Orientation: Person, Place, Time Obsessive Compulsive Thoughts/Behaviors: None  Cognitive Functioning Concentration: Normal Memory: Recent Intact, Remote Intact Is patient IDD: No Insight: Fair Impulse Control: Poor Appetite: Fair Have you had any weight changes? : No Change Sleep: No Change Total Hours of Sleep: 7 Vegetative Symptoms: None  ADLScreening [facility] Assessment Services) Patient's cognitive ability adequate to safely complete daily activities?: Yes Patient able to express need for assistance with ADLs?: Yes Independently performs ADLs?: Yes (appropriate for developmental age)  Prior Inpatient Therapy Prior Inpatient Therapy: No  Prior Outpatient Therapy Prior Outpatient Therapy: No Does patient have an ACCT team?: No Does patient have Intensive In-House Services?  : No Does patient have [facility] services? : No Does patient have P4CC services?: No  ADL Screening (condition at time of admission) Patient's cognitive ability adequate to safely complete daily activities?: Yes Is the patient deaf or have difficulty hearing?: No Does the patient have difficulty seeing, even when wearing glasses/contacts?: No Does the patient have difficulty concentrating, remembering, or making decisions?: No Patient able to  express need for assistance with ADLs?: Yes Does the patient have difficulty dressing or bathing?: No Independently performs ADLs?: Yes (appropriate for developmental age) Does the  patient have difficulty walking or climbing stairs?: No Weakness of Legs: None Weakness of Arms/Hands: None  Home Assistive Devices/Equipment Home Assistive Devices/Equipment: None  Therapy Consults (therapy consults require a physician order) PT Evaluation Needed: No OT Evalulation Needed: No SLP Evaluation Needed: No Abuse/Neglect Assessment (Assessment to be complete while patient is alone) Abuse/Neglect Assessment Can Be Completed: Yes Physical Abuse: Denies Verbal Abuse: Denies Sexual Abuse: Denies Exploitation of patient/patient's resources: Denies Self-Neglect: Denies Values / Beliefs Cultural Requests During Hospitalization: None Spiritual Requests During Hospitalization: None Consults Spiritual Care Consult Needed: No Transition of Care Team Consult Needed: No Advance Directives (For Healthcare) Does Patient Have a Medical Advance Directive?: No Would patient like information on creating a medical advance directive?: No - Patient declined          Disposition:  Disposition Initial Assessment Completed for this Encounter: Yes  On Site Evaluation by:   Reviewed with Physician:    Mamie Nick 10/11/2019 2:17 PM

## 2019-10-11 NOTE — H&P (Signed)
Behavioral Health Medical Screening Exam  Caroline Harvey is an 59 y.o. female with recent intentional overdose 2 days ago. She reports receiving Narcan by EMS. As per patient she reports recent stressors includes her increase in drug use, feelings of guilt, family dynamics, and relationship strain with family. She has one previous inpatient admission in March, and reports some benefit however unable to maintain a longer peirod of sobriety. She does endorse symptoms of guilt, worthless, hopeless, sadness, suicidal thoughts and suicide attempt. At this time she endorses passive suicidal ideation.   Total Time spent with patient: 30 minutes  Psychiatric Specialty Exam: Physical Exam  Review of Systems  Blood pressure 109/65, pulse 80, temperature 98.2 F (36.8 C), temperature source Oral, resp. rate 18, last menstrual period 12/14/2010, SpO2 99 %.There is no height or weight on file to calculate BMI.  General Appearance: Fairly Groomed  Eye Contact:  Fair  Speech:  Clear and Coherent and Normal Rate  Volume:  Normal  Mood:  Depressed, Hopeless and Worthless  Affect:  Congruent, Depressed and Restricted  Thought Process:  Coherent, Linear and Descriptions of Associations: Circumstantial  Orientation:  Full (Time, Place, and Person)  Thought Content:  Logical  Suicidal Thoughts:  Yes.  without intent/plan  Homicidal Thoughts:  No  Memory:  Immediate;   Fair Recent;   Fair  Judgement:  Poor  Insight:  Fair  Psychomotor Activity:  Normal  Concentration: Concentration: Fair and Attention Span: Fair  Recall:  [employer] of Knowledge:Fair  Language: Fair  Akathisia:  No  Handed:  Right  AIMS (if indicated):     Assets:  Communication Skills Desire for Improvement Financial Resources/Insurance Leisure Time Physical Health  Sleep:       Musculoskeletal: Strength & Muscle Tone: within normal limits Gait & Station: normal Patient leans: N/A  Blood pressure 109/65, pulse 80,  temperature 98.2 F (36.8 C), temperature source Oral, resp. rate 18, last menstrual period 12/14/2010, SpO2 99 %.  Recommendations:  Based on my evaluation patient does not appear to have a medical emergency. SHe reports an overdose however received narcan on site by EMS two days ago and checked out ok per patient. Her inital vital signs were also stable at this time. Patient appears to be in no acute distress at this time. Labs reveiewed on recent admission in March were WNL.   Will recommend inpatient at this time. Will resume home medications once a bed becomes available.No bed availability at [facility] will admit to observation.   Suella Broad, FNP 10/11/2019, 3:38 PM

## 2019-10-11 NOTE — Plan of Care (Signed)
[place] Observation Crisis Plan  Reason for Crisis Plan:  Crisis Stabilization   Plan of Care:  Referral for Inpatient Hospitalization  Family Support:      Current Living Environment:  Living Arrangements: Alone  Insurance:   Hospital Account    Name Acct ID Class Status Primary Coverage   Caroline Harvey, Caroline Harvey [id number] BEHAVIORAL HEALTH OBSERVATION Open [employer]  - [employer]        Guarantor Account (for Hospital Account [id number])    Name Relation to Pt Service Area Active? Acct Type   Caroline Harvey Self [facility] Yes [facility]   Address Phone       [place], [place] [address] [phone])          Coverage Information (for Hospital Account [id number])    F/O Payor/Plan Precert #   Caroline Harvey #   Caroline Harvey, Caroline Harvey [id number]   Address Phone   [address] [address] [place], [place] [address]       Legal Guardian:  Legal Guardian: (NA)  Primary Care Provider:  Aretta Nip, MD  Current Outpatient Providers:  Caroline Nip, MD  Psychiatrist:  Name of Psychiatrist: None  Counselor/Therapist:  Name of Therapist: None  Compliant with Medications:  Yes  Additional Information:   Caroline Harvey 5/17/20219:02 PM

## 2019-10-12 DIAGNOSIS — R45851 Suicidal ideations: Secondary | ICD-10-CM | POA: Diagnosis present

## 2019-10-12 DIAGNOSIS — I10 Essential (primary) hypertension: Secondary | ICD-10-CM | POA: Diagnosis present

## 2019-10-12 DIAGNOSIS — F1120 Opioid dependence, uncomplicated: Secondary | ICD-10-CM | POA: Diagnosis present

## 2019-10-12 DIAGNOSIS — F1020 Alcohol dependence, uncomplicated: Secondary | ICD-10-CM | POA: Diagnosis present

## 2019-10-12 DIAGNOSIS — Z8249 Family history of ischemic heart disease and other diseases of the circulatory system: Secondary | ICD-10-CM | POA: Diagnosis not present

## 2019-10-12 DIAGNOSIS — F1994 Other psychoactive substance use, unspecified with psychoactive substance-induced mood disorder: Secondary | ICD-10-CM

## 2019-10-12 DIAGNOSIS — Z20822 Contact with and (suspected) exposure to covid-19: Secondary | ICD-10-CM | POA: Diagnosis present

## 2019-10-12 DIAGNOSIS — F1420 Cocaine dependence, uncomplicated: Secondary | ICD-10-CM | POA: Diagnosis present

## 2019-10-12 DIAGNOSIS — Z803 Family history of malignant neoplasm of breast: Secondary | ICD-10-CM | POA: Diagnosis not present

## 2019-10-12 DIAGNOSIS — G4700 Insomnia, unspecified: Secondary | ICD-10-CM | POA: Diagnosis present

## 2019-10-12 DIAGNOSIS — F419 Anxiety disorder, unspecified: Secondary | ICD-10-CM | POA: Diagnosis present

## 2019-10-12 DIAGNOSIS — F332 Major depressive disorder, recurrent severe without psychotic features: Secondary | ICD-10-CM | POA: Diagnosis present

## 2019-10-12 LAB — IRON AND TIBC
Iron: 54 ug/dL (ref 28–170)
Saturation Ratios: 15 % (ref 10.4–31.8)
TIBC: 356 ug/dL (ref 250–450)
UIBC: 302 ug/dL

## 2019-10-12 LAB — RETICULOCYTES
Immature Retic Fract: 3.4 % (ref 2.3–15.9)
RBC.: 3.99 MIL/uL (ref 3.87–5.11)
Retic Count, Absolute: 41.5 10*3/uL (ref 19.0–186.0)
Retic Ct Pct: 1 % (ref 0.4–3.1)

## 2019-10-12 LAB — URINALYSIS, COMPLETE (UACMP) WITH MICROSCOPIC
Bacteria, UA: NONE SEEN
Bilirubin Urine: NEGATIVE
Glucose, UA: NEGATIVE mg/dL
Ketones, ur: NEGATIVE mg/dL
Leukocytes,Ua: NEGATIVE
Nitrite: NEGATIVE
Protein, ur: NEGATIVE mg/dL
Specific Gravity, Urine: 1.009 (ref 1.005–1.030)
pH: 6 (ref 5.0–8.0)

## 2019-10-12 LAB — RAPID URINE DRUG SCREEN, HOSP PERFORMED
Amphetamines: NOT DETECTED
Barbiturates: NOT DETECTED
Benzodiazepines: NOT DETECTED
Cocaine: POSITIVE — AB
Opiates: NOT DETECTED
Tetrahydrocannabinol: NOT DETECTED

## 2019-10-12 LAB — FOLATE: Folate: 9.1 ng/mL (ref >5.9)

## 2019-10-12 LAB — FERRITIN: Ferritin: 29 ng/mL (ref 11–307)

## 2019-10-12 LAB — VITAMIN B12: Vitamin B-12: 1044 pg/mL — ABNORMAL HIGH (ref 180–914)

## 2019-10-12 MED ORDER — METHOCARBAMOL 500 MG PO TABS
500.0000 mg | ORAL_TABLET | Freq: Three times a day (TID) | ORAL | Status: DC | PRN
  Administered 2019-10-12 – 2019-10-15 (×7): 500 mg via ORAL
  Filled 2019-10-12 (×7): qty 1

## 2019-10-12 MED ORDER — NAPROXEN 500 MG PO TABS
500.0000 mg | ORAL_TABLET | Freq: Two times a day (BID) | ORAL | Status: DC | PRN
  Administered 2019-10-12 – 2019-10-15 (×5): 500 mg via ORAL
  Filled 2019-10-12 (×3): qty 1
  Filled 2019-10-12: qty 2

## 2019-10-12 MED ORDER — LORAZEPAM 1 MG PO TABS
1.0000 mg | ORAL_TABLET | Freq: Four times a day (QID) | ORAL | Status: DC | PRN
  Administered 2019-10-12 – 2019-10-14 (×3): 1 mg via ORAL
  Filled 2019-10-12 (×3): qty 1

## 2019-10-12 MED ORDER — THIAMINE HCL 100 MG PO TABS
100.0000 mg | ORAL_TABLET | Freq: Every day | ORAL | Status: DC
  Administered 2019-10-12 – 2019-10-15 (×4): 100 mg via ORAL
  Filled 2019-10-12 (×6): qty 1

## 2019-10-12 MED ORDER — LOPERAMIDE HCL 2 MG PO CAPS: 2.0000 mg | ORAL_CAPSULE | ORAL | Status: DC | PRN

## 2019-10-12 MED ORDER — MAGNESIUM HYDROXIDE 400 MG/5ML PO SUSP: 30.0000 mL | Freq: Every day | ORAL | Status: DC | PRN

## 2019-10-12 MED ORDER — DICYCLOMINE HCL 20 MG PO TABS: 20.0000 mg | ORAL_TABLET | Freq: Four times a day (QID) | ORAL | Status: DC | PRN

## 2019-10-12 MED ORDER — HYDROXYZINE HCL 25 MG PO TABS
25.0000 mg | ORAL_TABLET | Freq: Four times a day (QID) | ORAL | Status: DC | PRN
  Administered 2019-10-12 – 2019-10-14 (×4): 25 mg via ORAL
  Filled 2019-10-12 (×2): qty 1

## 2019-10-12 MED ORDER — TRAZODONE HCL 50 MG PO TABS
50.0000 mg | ORAL_TABLET | Freq: Every evening | ORAL | Status: DC | PRN
  Administered 2019-10-12 – 2019-10-13 (×2): 50 mg via ORAL
  Filled 2019-10-12 (×2): qty 1

## 2019-10-12 MED ORDER — ONDANSETRON 4 MG PO TBDP: 4.0000 mg | ORAL_TABLET | Freq: Four times a day (QID) | ORAL | Status: DC | PRN

## 2019-10-12 MED ORDER — ALUM & MAG HYDROXIDE-SIMETH 200-200-20 MG/5ML PO SUSP: 30.0000 mL | ORAL | Status: DC | PRN

## 2019-10-12 MED ORDER — ACETAMINOPHEN 325 MG PO TABS
650.0000 mg | ORAL_TABLET | Freq: Four times a day (QID) | ORAL | Status: DC | PRN
  Administered 2019-10-13: 650 mg via ORAL
  Filled 2019-10-12: qty 2

## 2019-10-12 MED ORDER — HYDROXYZINE HCL 25 MG PO TABS
25.0000 mg | ORAL_TABLET | Freq: Three times a day (TID) | ORAL | Status: DC | PRN
  Administered 2019-10-14: 25 mg via ORAL
  Filled 2019-10-12 (×2): qty 1

## 2019-10-12 MED ORDER — AMLODIPINE BESYLATE 5 MG PO TABS
5.0000 mg | ORAL_TABLET | Freq: Every day | ORAL | Status: DC
  Administered 2019-10-12 – 2019-10-15 (×4): 5 mg via ORAL
  Filled 2019-10-12 (×6): qty 1

## 2019-10-12 MED ORDER — FOLIC ACID 1 MG PO TABS
1.0000 mg | ORAL_TABLET | Freq: Every day | ORAL | Status: DC
  Administered 2019-10-12 – 2019-10-15 (×4): 1 mg via ORAL
  Filled 2019-10-12 (×6): qty 1

## 2019-10-12 NOTE — Progress Notes (Signed)
[place] NOVEL CORONAVIRUS (COVID-19) DAILY CHECK-OFF SYMPTOMS - answer yes or no to each - every day NO YES  Have you had a fever in the past 24 hours?  . Fever (Temp > 37.80C / 100F) X   Have you had any of these symptoms in the past 24 hours? . New Cough .  Sore Throat  .  Shortness of Breath .  Difficulty Breathing .  Unexplained Body Aches   X   Have you had any one of these symptoms in the past 24 hours not related to allergies?   . Runny Nose .  Nasal Congestion .  Sneezing   X   If you have had runny nose, nasal congestion, sneezing in the past 24 hours, has it worsened?  X   EXPOSURES - check yes or no X   Have you traveled outside the state in the past 14 days?  X   Have you been in contact with someone with a confirmed diagnosis of COVID-19 or PUI in the past 14 days without wearing appropriate PPE?  X   Have you been living in the same home as a person with confirmed diagnosis of COVID-19 or a PUI (household contact)?    X   Have you been diagnosed with COVID-19?    X              What to do next: Answered NO to all: Answered YES to anything:   Proceed with unit schedule Follow the [facility] Inpatient Flowsheet.  Pt A & O X4. Denies SI, HI and AVH when assessed. C/o of backache 7/10. Stated to Probation officer "I'm here because my daughter wants me to get clean so I can help take care of my grand daughter. I'm tired being on drugs, I want to do right this time because I am getting younger". Presents with a flat affect and depressed mood, fidgety in bed with constant shifting "it's that knot in my back and I get comfortable". Compliant with medications when offered. Emotional support and availability offered to pt as needed throughout this shift. Scheduled and PRN (see emar) medications administered with verbal education and effects monitored. Q 15 minutes safety checks maintained without self harm gestures or outburst.  Pt tolerated all PO intake well. Remains safe on unit.

## 2019-10-12 NOTE — BH Assessment (Signed)
[place] Assessment Progress Note  Per Myles Lipps, MD, this voluntary pt requires psychiatric hospitalization at this time.  Jasmine has assigned pt to [facility] Rm 304-2.  Pt's nurse, Nicoletta Dress, has been notified.  Jalene Mullet, [place] Coordinator [phone]

## 2019-10-12 NOTE — Progress Notes (Signed)
Patient ID: Caroline Harvey, female   DOB: 08/31/1960, 59 y.o.   MRN: [id number] Pt A&O x 4, no distress noted, calm & cooperative.  Denies SI at present,  Pending transfer to 305-1.

## 2019-10-12 NOTE — H&P (Signed)
Psychiatric Admission Assessment Adult  Patient Identification: Caroline Harvey MRN:  [id number] Date of Evaluation:  10/12/2019 Chief Complaint:  MDD (major depressive disorder), recurrent severe, without psychosis ([place]) [F33.2] Substance induced mood disorder ([place]) [F19.94] Principal Diagnosis: <principal problem not specified> Diagnosis:  Active Problems:   Substance induced mood disorder ([place])   MDD (major depressive disorder), recurrent severe, without psychosis ([place])  History of Present Illness: Patient is seen and examined.  Patient is a 59 year old female with a past psychiatric history significant for opiate dependence, cocaine dependence, alcohol dependence who presented to the behavioral health hospital requesting assistance with her ongoing substance abuse issues.  She stated that she was drinking between 4 to 6-4 ounce beers a day.  She also admitted to using heroin the day prior to admission.  She also admitted to cocaine usage.  Her last psychiatric admission was in March 2021.  It was for similar complaints at that time.  She was discharged with the diagnosis of substance-induced mood disorder.  She stated she had no days of sobriety after she had been discharged from the hospital.  Her drug screen on admission showed a blood alcohol that was less than 10, there were no opiates in her system, and there was cocaine present.  The decision was made to admit her to the hospital for evaluation and stabilization.  Associated Signs/Symptoms: Depression Symptoms:  anhedonia, insomnia, psychomotor agitation, fatigue, feelings of worthlessness/guilt, difficulty concentrating, suicidal thoughts without plan, anxiety, loss of energy/fatigue, disturbed sleep, (Hypo) Manic Symptoms:  Impulsivity, Irritable Mood, Labiality of Mood, Anxiety Symptoms:  Excessive Worry, Psychotic Symptoms:  Denied PTSD Symptoms: Negative Total Time spent with patient: 45 minutes  Past Psychiatric  History: Patient has a longstanding history of polysubstance dependence.  Her most recent hospitalization was on 07/29/2019.  Her drugs of choice are heroin, cocaine and alcohol.  Is the patient at risk to self? Yes.    Has the patient been a risk to self in the past 6 months? Yes.    Has the patient been a risk to self within the distant past? Yes.    Is the patient a risk to others? No.  Has the patient been a risk to others in the past 6 months? No.  Has the patient been a risk to others within the distant past? No.   Prior Inpatient Therapy: Prior Inpatient Therapy: No Prior Outpatient Therapy: Prior Outpatient Therapy: No Does patient have an ACCT team?: No Does patient have Intensive In-House Services?  : No Does patient have [facility] services? : No Does patient have P4CC services?: No  Alcohol Screening: 1. How often do you have a drink containing alcohol?: 4 or more times a week 2. How many drinks containing alcohol do you have on a typical day when you are drinking?: 5 or 6 3. How often do you have six or more drinks on one occasion?: Monthly AUDIT-C Score: 8 4. How often during the last year have you found that you were not able to stop drinking once you had started?: Daily or almost daily 5. How often during the last year have you failed to do what was normally expected from you because of drinking?: Never 6. How often during the last year have you needed a first drink in the morning to get yourself going after a heavy drinking session?: Weekly 7. How often during the last year have you had a feeling of guilt of remorse after drinking?: Daily or almost daily 8. How often during the  last year have you been unable to remember what happened the night before because you had been drinking?: Less than monthly 9. Have you or someone else been injured as a result of your drinking?: No 10. Has a relative or friend or a doctor or another health worker been concerned about your drinking or  suggested you cut down?: Yes, but not in the last year Alcohol Use Disorder Identification Test Final Score (AUDIT): 22 Substance Abuse History in the last 12 months:  Yes.   Consequences of Substance Abuse: Medical Consequences:  Clearly contributed to the 2 most recent psychiatric hospitalizations. Previous Psychotropic Medications: Yes  Psychological Evaluations: Yes  Past Medical History:  Past Medical History:  Diagnosis Date  . Alcohol abuse   . Anemia   . Hypertension   . Hyponatremia 09/2014    Past Surgical History:  Procedure Laterality Date  . ANKLE SURGERY Right    x  2   Family History:  Family History  Problem Relation Age of Onset  . Hypertension Mother   . Breast cancer Paternal Aunt        ? age of onset  . Breast cancer Paternal Aunt        ? age of onset   Family Psychiatric  History: Noncontributory Tobacco Screening:   Social History:  Social History   Substance and Sexual Activity  Alcohol Use Yes   Comment: Quit May 20 2015     Social History   Substance and Sexual Activity  Drug Use No    Additional Social History: Marital status: Single    Pain Medications: See MAR Prescriptions: See MAR Over the Counter: See MAR History of alcohol / drug use?: Yes Name of Substance 1: Heroin 1 - Age of First Use: 36 1 - Amount (size/oz): Varies 1 - Frequency: Varies 1 - Duration: Ongoing 1 - Last Use / Amount: 10/11/19 unknown amount Name of Substance 2: Alcohol 2 - Age of First Use: 18 2 - Amount (size/oz): Amounts vary 2 - Frequency: Varies 2 - Duration: Ongoing 2 - Last Use / Amount: 10/10/19 "a few beers"                Allergies:  No Known Allergies Lab Results:  Results for orders placed or performed during the hospital encounter of 10/11/19 (from the past 48 hour(s))  SARS Coronavirus 2 by RT PCR (hospital order, performed in [place] hospital lab) Nasopharyngeal Nasopharyngeal Swab     Status: None   Collection Time: 10/11/19   3:08 PM   Specimen: Nasopharyngeal Swab  Result Value Ref Range   SARS Coronavirus 2 NEGATIVE NEGATIVE    Comment: (NOTE) SARS-CoV-2 target nucleic acids are NOT DETECTED. The SARS-CoV-2 RNA is generally detectable in upper and lower respiratory specimens during the acute phase of infection. The lowest concentration of SARS-CoV-2 viral copies this assay can detect is 250 copies / mL. A negative result does not preclude SARS-CoV-2 infection and should not be used as the sole basis for treatment or other patient management decisions.  A negative result may occur with improper specimen collection / handling, submission of specimen other than nasopharyngeal swab, presence of viral mutation(s) within the areas targeted by this assay, and inadequate number of viral copies (<250 copies / mL). A negative result must be combined with clinical observations, patient history, and epidemiological information. Fact Sheet for Patients:   StrictlyIdeas.no Fact Sheet for Healthcare Providers: BankingDealers.co.za This test is not yet approved or cleared  by  the [employer] and has been authorized for detection and/or diagnosis of SARS-CoV-2 by FDA under an Emergency Use Authorization (EUA).  This EUA will remain in effect (meaning this test can be used) for the duration of the COVID-19 declaration under Section 564(b)(1) of the Act, 21 U.S.C. section 360bbb-3(b)(1), unless the authorization is terminated or revoked sooner. Performed at [facility], [place] [address], [place], [place] [address]   CBC     Status: Abnormal   Collection Time: 10/11/19  4:53 PM  Result Value Ref Range   WBC 3.7 (L) 4.0 - 10.5 K/uL   RBC 4.30 3.87 - 5.11 MIL/uL   Hemoglobin 12.9 12.0 - 15.0 g/dL   HCT 40.9 36.0 - 46.0 %   MCV 95.1 80.0 - 100.0 fL   MCH 30.0 26.0 - 34.0 pg   MCHC 31.5 30.0 - 36.0 g/dL   RDW 15.0 11.5 - 15.5 %   Platelets 229 150 -  400 K/uL   nRBC 0.0 0.0 - 0.2 %    Comment: Performed at [facility], [place] [address], [place], [place] [address]  Comprehensive metabolic panel     Status: Abnormal   Collection Time: 10/11/19  4:53 PM  Result Value Ref Range   Sodium 131 (L) 135 - 145 mmol/L   Potassium 4.7 3.5 - 5.1 mmol/L   Chloride 96 (L) 98 - 111 mmol/L   CO2 26 22 - 32 mmol/L   Glucose, Bld 107 (H) 70 - 99 mg/dL    Comment: Glucose reference range applies only to samples taken after fasting for at least 8 hours.   BUN 18 6 - 20 mg/dL   Creatinine, Ser 1.05 (H) 0.44 - 1.00 mg/dL   Calcium 9.1 8.9 - 10.3 mg/dL   Total Protein 7.8 6.5 - 8.1 g/dL   Albumin 4.1 3.5 - 5.0 g/dL   AST 19 15 - 41 U/L   ALT 9 0 - 44 U/L   Alkaline Phosphatase 73 38 - 126 U/L   Total Bilirubin 0.7 0.3 - 1.2 mg/dL   GFR calc non Af Amer 59 (L) >60 mL/min   GFR calc Af Amer >60 >60 mL/min   Anion gap 9 5 - 15    Comment: Performed at [facility], [place] [address] [place], [place] [address]  Ethanol     Status: None   Collection Time: 10/11/19  4:53 PM  Result Value Ref Range   Alcohol, Ethyl (B) <10 <10 mg/dL    Comment: (NOTE) Lowest detectable limit for serum alcohol is 10 mg/dL. For medical purposes only. Performed at [facility], [place] [address], [place], [place] [address]   Urinalysis, Complete w Microscopic     Status: Abnormal   Collection Time: 10/12/19  5:49 AM  Result Value Ref Range   Color, Urine STRAW (A) YELLOW   APPearance CLEAR CLEAR   Specific Gravity, Urine 1.009 1.005 - 1.030   pH 6.0 5.0 - 8.0   Glucose, UA NEGATIVE NEGATIVE mg/dL   Hgb urine dipstick SMALL (A) NEGATIVE   Bilirubin Urine NEGATIVE NEGATIVE   Ketones, ur NEGATIVE NEGATIVE mg/dL   Protein, ur NEGATIVE NEGATIVE mg/dL   Nitrite NEGATIVE NEGATIVE   Leukocytes,Ua NEGATIVE NEGATIVE   WBC, UA 0-5 0 - 5 WBC/hpf   Bacteria, UA NONE SEEN NONE SEEN    Comment: Performed at [facility], [place] [address], [place], [place] [address]  Urine rapid drug screen (  hosp performed)not at [facility]     Status: Abnormal   Collection Time: 10/12/19  5:49 AM  Result Value Ref Range   Opiates NONE DETECTED NONE DETECTED   Cocaine POSITIVE (A) NONE DETECTED   Benzodiazepines NONE DETECTED NONE DETECTED   Amphetamines NONE DETECTED NONE DETECTED   Tetrahydrocannabinol NONE DETECTED NONE DETECTED   Barbiturates NONE DETECTED NONE DETECTED    Comment: (NOTE) DRUG SCREEN FOR MEDICAL PURPOSES ONLY.  IF CONFIRMATION IS NEEDED FOR ANY PURPOSE, NOTIFY LAB WITHIN 5 DAYS. LOWEST DETECTABLE LIMITS FOR URINE DRUG SCREEN Drug Class                     Cutoff (ng/mL) Amphetamine and metabolites    1000 Barbiturate and metabolites    200 Benzodiazepine                 [id number] Tricyclics and metabolites     300 Opiates and metabolites        300 Cocaine and metabolites        300 THC                            50 Performed at [facility], [place] [address] [place], [place] [address]     Blood Alcohol level:  Lab Results  Component Value Date   ETH <10 10/11/2019   ETH <10 [id number]    Metabolic Disorder Labs:  Lab Results  Component Value Date   HGBA1C 5.0 07/29/2019   MPG 96.8 07/29/2019   No results found for: PROLACTIN Lab Results  Component Value Date   CHOL 161 07/29/2019   TRIG 31 07/29/2019   HDL 96 07/29/2019   CHOLHDL 1.7 07/29/2019   VLDL 6 07/29/2019   LDLCALC 59 07/29/2019   LDLCALC 31 01/18/2008    Current Medications: Current Facility-Administered Medications  Medication Dose Route Frequency Provider Last Rate Last Admin  . acetaminophen (TYLENOL) tablet 650 mg  650 mg Oral Q6H PRN Sharma Covert, MD      . alum & mag hydroxide-simeth (MAALOX/MYLANTA) 200-200-20 MG/5ML suspension 30 mL  30 mL Oral Q4H PRN Sharma Covert, MD      . amLODipine (NORVASC) tablet 5 mg  5 mg Oral Daily Sharma Covert, MD   5 mg at 10/12/19  1120  . dicyclomine (BENTYL) tablet 20 mg  20 mg Oral Q6H PRN Sharma Covert, MD      . folic acid (FOLVITE) tablet 1 mg  1 mg Oral Daily Sharma Covert, MD   1 mg at 10/12/19 1114  . hydrOXYzine (ATARAX/VISTARIL) tablet 25 mg  25 mg Oral TID PRN Sharma Covert, MD      . hydrOXYzine (ATARAX/VISTARIL) tablet 25 mg  25 mg Oral Q6H PRN Sharma Covert, MD      . loperamide (IMODIUM) capsule 2-4 mg  2-4 mg Oral PRN Sharma Covert, MD      . LORazepam (ATIVAN) tablet 1 mg  1 mg Oral Q6H PRN Sharma Covert, MD      . magnesium hydroxide (MILK OF MAGNESIA) suspension 30 mL  30 mL Oral Daily PRN Sharma Covert, MD      . methocarbamol (ROBAXIN) tablet 500 mg  500 mg Oral Q8H PRN Sharma Covert, MD   500 mg at 10/12/19 1119  . naproxen (NAPROSYN) tablet 500 mg  500 mg Oral BID PRN Sharma Covert,  MD      . ondansetron (ZOFRAN-ODT) disintegrating tablet 4 mg  4 mg Oral Q6H PRN Sharma Covert, MD      . pantoprazole (PROTONIX) EC tablet 40 mg  40 mg Oral Daily Lindon Romp A, NP   40 mg at 10/12/19 [id number]  . thiamine tablet 100 mg  100 mg Oral Daily Sharma Covert, MD   100 mg at 10/12/19 1113  . traZODone (DESYREL) tablet 50 mg  50 mg Oral QHS PRN Sharma Covert, MD       PTA Medications: Medications Prior to Admission  Medication Sig Dispense Refill Last Dose  . amLODipine (NORVASC) 5 MG tablet Take 1 tablet (5 mg total) by mouth daily. 30 tablet 0   . traZODone (DESYREL) 50 MG tablet Take 1 tablet (50 mg total) by mouth at bedtime as needed and may repeat dose one time if needed for sleep. 30 tablet 0     Musculoskeletal: Strength & Muscle Tone: within normal limits Gait & Station: normal Patient leans: N/A  Psychiatric Specialty Exam: Physical Exam  Nursing note and vitals reviewed. Constitutional: She is oriented to person, place, and time. She appears well-developed and well-nourished.  HENT:  Head: Normocephalic and atraumatic.  Respiratory:  Effort normal.  Neurological: She is alert and oriented to person, place, and time.    Review of Systems  Blood pressure (!) 142/78, pulse 66, temperature 98 F (36.7 C), temperature source Oral, resp. rate 18, last menstrual period 12/14/2010, SpO2 99 %.There is no height or weight on file to calculate BMI.  General Appearance: Disheveled  Eye Contact:  Minimal  Speech:  Normal Rate  Volume:  Decreased  Mood:  Anxious, Depressed and Dysphoric  Affect:  Congruent  Thought Process:  Coherent and Descriptions of Associations: Circumstantial  Orientation:  Full (Time, Place, and Person)  Thought Content:  Logical  Suicidal Thoughts:  Yes.  without intent/plan  Homicidal Thoughts:  No  Memory:  Immediate;   Fair Recent;   Fair Remote;   Fair  Judgement:  Impaired  Insight:  Lacking  Psychomotor Activity:  Increased  Concentration:  Concentration: Fair and Attention Span: Fair  Recall:  [employer] of Knowledge:  Fair  Language:  Good  Akathisia:  Negative  Handed:  Right  AIMS (if indicated):     Assets:  Desire for Improvement Resilience  ADL's:  Intact  Cognition:  WNL  Sleep:       Treatment Plan Summary: Daily contact with patient to assess and evaluate symptoms and progress in treatment, Medication management and Plan : Patient is seen and examined.  Patient is a 59 year old female with the above-stated past psychiatric history who was admitted secondary to continued substance abuse, subtle suicidal ideation, and concern for complicated withdrawal.  She will be admitted to the hospital.  She will be integrated in the milieu.  She will be encouraged to attend groups.  She will be placed on the opiate detox protocol.  She will also be placed on lorazepam 1 mg p.o. every 6 hours as needed a CIWA greater than 10.  She will also have available hydroxyzine as well as trazodone for anxiety and sleep.  On her last admission she was discharged on amlodipine as well as trazodone.  We will  restart the amlodipine for blood pressure.  Review of her admission laboratories showed a mild hyponatremia at 131.  She also had a mild hypochloremia so I would suspect she may be back  on her hydrochlorothiazide.  Her creatinine was mildly elevated at 1.05.  Liver function enzymes were normal.  Her white blood cell count was mildly low at 3.7, but the rest of her CBC was normal.  Urinalysis showed the presence of blood.  Otherwise negative.  As stated above her blood alcohol was less than 10, drug screen was positive for cocaine.  Her vital signs show that her blood pressure still elevated.  Is 142/78.  Her pulse was just 66.  Her most recent CIWA was 0.  Observation Level/Precautions:  Detox 15 minute checks  Laboratory:  Chemistry Profile  Psychotherapy:    Medications:    Consultations:    Discharge Concerns:    Estimated LOS:  Other:     Physician Treatment Plan for Primary Diagnosis: <principal problem not specified> Long Term Goal(s): Improvement in symptoms so as ready for discharge  Short Term Goals: Ability to identify changes in lifestyle to reduce recurrence of condition will improve, Ability to verbalize feelings will improve, Ability to disclose and discuss suicidal ideas, Ability to demonstrate self-control will improve, Ability to identify and develop effective coping behaviors will improve, Ability to maintain clinical measurements within normal limits will improve, Compliance with prescribed medications will improve and Ability to identify triggers associated with substance abuse/mental health issues will improve  Physician Treatment Plan for Secondary Diagnosis: Active Problems:   Substance induced mood disorder ([place])   MDD (major depressive disorder), recurrent severe, without psychosis ([place])  Long Term Goal(s): Improvement in symptoms so as ready for discharge  Short Term Goals: Ability to identify changes in lifestyle to reduce recurrence of condition will improve,  Ability to verbalize feelings will improve, Ability to disclose and discuss suicidal ideas, Ability to demonstrate self-control will improve, Ability to identify and develop effective coping behaviors will improve, Ability to maintain clinical measurements within normal limits will improve, Compliance with prescribed medications will improve and Ability to identify triggers associated with substance abuse/mental health issues will improve  I certify that inpatient services furnished can reasonably be expected to improve the patient's condition.    Sharma Covert, MD 5/18/20212:53 PM

## 2019-10-12 NOTE — [facility] Suicide Risk Assessment (Signed)
[facility] Admission Suicide Risk Assessment   Nursing information obtained from:  Patient Demographic factors:  Living alone Current Mental Status:  NA Loss Factors:  NA Historical Factors:  Prior suicide attempts Risk Reduction Factors:  NA  Total Time spent with patient: 20 minutes Principal Problem: <principal problem not specified> Diagnosis:  Active Problems:   Substance induced mood disorder ([facility])   MDD (major depressive disorder), recurrent severe, without psychosis ([place])  Subjective Data: Patient is seen and examined.  Patient is a 59 year old female with a past psychiatric history significant for opiate dependence, cocaine dependence, alcohol dependence who presented to the behavioral health hospital requesting assistance with her ongoing substance abuse issues.  She stated that she was drinking between 4 to 6-4 ounce beers a day.  She also admitted to using heroin the day prior to admission.  She also admitted to cocaine usage.  Her last psychiatric admission was in March 2021.  It was for similar complaints at that time.  She was discharged with the diagnosis of substance-induced mood disorder.  She stated she had no days of sobriety after she had been discharged from the hospital.  Her drug screen on admission showed a blood alcohol that was less than 10, there were no opiates in her system, and there was cocaine present.  The decision was made to admit her to the hospital for evaluation and stabilization.  Continued Clinical Symptoms:  Alcohol Use Disorder Identification Test Final Score (AUDIT): 22 The "Alcohol Use Disorders Identification Test", Guidelines for Use in Primary Care, Second Edition.  World Pharmacologist [facility]). Score between 0-7:  no or low risk or alcohol related problems. Score between 8-15:  moderate risk of alcohol related problems. Score between 16-19:  high risk of alcohol related problems. Score 20 or above:  warrants further diagnostic evaluation for  alcohol dependence and treatment.   CLINICAL FACTORS:   Depression:   Anhedonia Comorbid alcohol abuse/dependence Hopelessness Impulsivity Insomnia Alcohol/Substance Abuse/Dependencies   Musculoskeletal: Strength & Muscle Tone: within normal limits Gait & Station: normal Patient leans: N/A  Psychiatric Specialty Exam: Physical Exam  Nursing note and vitals reviewed. Constitutional: She is oriented to person, place, and time. She appears well-developed and well-nourished.  HENT:  Head: Normocephalic and atraumatic.  Respiratory: Effort normal.  Neurological: She is alert and oriented to person, place, and time.    Review of Systems  Blood pressure (!) 142/78, pulse 66, temperature 98 F (36.7 C), temperature source Oral, resp. rate 18, last menstrual period 12/14/2010, SpO2 99 %.There is no height or weight on file to calculate BMI.  General Appearance: Disheveled  Eye Contact:  Fair  Speech:  Normal Rate  Volume:  Normal  Mood:  Dysphoric  Affect:  Congruent  Thought Process:  Coherent and Descriptions of Associations: Circumstantial  Orientation:  Full (Time, Place, and Person)  Thought Content:  Logical  Suicidal Thoughts:  No  Homicidal Thoughts:  No  Memory:  Immediate;   Fair Recent;   Fair Remote;   Fair  Judgement:  Impaired  Insight:  Lacking  Psychomotor Activity:  Normal  Concentration:  Concentration: Fair and Attention Span: Fair  Recall:  [employer] of Knowledge:  Fair  Language:  Good  Akathisia:  Negative  Handed:  Right  AIMS (if indicated):     Assets:  Desire for Improvement Resilience  ADL's:  Intact  Cognition:  WNL  Sleep:         COGNITIVE FEATURES THAT CONTRIBUTE TO RISK:  None    SUICIDE RISK:   Mild:  Suicidal ideation of limited frequency, intensity, duration, and specificity.  There are no identifiable plans, no associated intent, mild dysphoria and related symptoms, good self-control (both objective and subjective  assessment), few other risk factors, and identifiable protective factors, including available and accessible social support.  PLAN OF CARE: Patient is seen and examined.  Patient is a 59 year old female with the above-stated past psychiatric history who was admitted secondary to continued substance abuse, subtle suicidal ideation, and concern for complicated withdrawal.  She will be admitted to the hospital.  She will be integrated in the milieu.  She will be encouraged to attend groups.  She will be placed on the opiate detox protocol.  She will also be placed on lorazepam 1 mg p.o. every 6 hours as needed a CIWA greater than 10.  She will also have available hydroxyzine as well as trazodone for anxiety and sleep.  On her last admission she was discharged on amlodipine as well as trazodone.  We will restart the amlodipine for blood pressure.  Review of her admission laboratories showed a mild hyponatremia at 131.  She also had a mild hypochloremia so I would suspect she may be back on her hydrochlorothiazide.  Her creatinine was mildly elevated at 1.05.  Liver function enzymes were normal.  Her white blood cell count was mildly low at 3.7, but the rest of her CBC was normal.  Urinalysis showed the presence of blood.  Otherwise negative.  As stated above her blood alcohol was less than 10, drug screen was positive for cocaine.  Her vital signs show that her blood pressure still elevated.  Is 142/78.  Her pulse was just 66.  Her most recent CIWA was 0.  I certify that inpatient services furnished can reasonably be expected to improve the patient's condition.   Sharma Covert, MD 10/12/2019, 10:24 AM

## 2019-10-13 DIAGNOSIS — F332 Major depressive disorder, recurrent severe without psychotic features: Principal | ICD-10-CM

## 2019-10-13 MED ORDER — CLONIDINE HCL 0.1 MG PO TABS
0.1000 mg | ORAL_TABLET | Freq: Once | ORAL | Status: AC
  Administered 2019-10-13: 0.1 mg via ORAL
  Filled 2019-10-13: qty 1

## 2019-10-13 NOTE — Progress Notes (Signed)
Patient ID: Caroline Harvey, female   DOB: 09-Apr-1961, 59 y.o.   MRN: [id number] Patient presents voluntarily reporting that she needs help with her substance abuse problem. Reports that she has been suffering from alcoholism for a long time feeling overwhelmed. 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 Patient is hopeless and anxious, reporting that she is "tired of living like this". Patient lives alone but has a daughter and a grandchild. She has a mother who is supportive. Patient has been losing weight due to poor self care, not cooking good meals for herself. She presents with history of hypertension: BP 160/93 and has a prescription for bp medications. Upon skin assessment, it is noted that patient has bilateral edema, not pitting: Patient denies pain /discomfort at the area. Patient was oriented to the unit and safety precautions initiated.

## 2019-10-13 NOTE — Tx Team (Signed)
Initial Treatment Plan 10/13/2019 12:48 AM Ardith Dark [id number]    PATIENT STRESSORS: Financial difficulties Substance abuse   PATIENT STRENGTHS: Ability for insight Active sense of humor Average or above average intelligence Capable of independent living Motivation for treatment/growth   PATIENT IDENTIFIED PROBLEMS: depression  anxiety  Substance abuse  Withdrawal symptoms               DISCHARGE CRITERIA:  Ability to meet basic life and health needs Improved stabilization in mood, thinking, and/or behavior Motivation to continue treatment in a less acute level of care Verbal commitment to aftercare and medication compliance Withdrawal symptoms are absent or subacute and managed without 24-hour nursing intervention  PRELIMINARY DISCHARGE PLAN: Outpatient therapy Return to previous living arrangement Return to previous work or school arrangements  PATIENT/FAMILY INVOLVEMENT: This treatment plan has been presented to and reviewed with the patient, Momina Nienaber.  The patient has been given the opportunity to ask questions and make suggestions.  Ronelle Nigh, RN 10/13/2019, 12:48 AM

## 2019-10-13 NOTE — Tx Team (Signed)
Interdisciplinary Treatment and Diagnostic Plan Update  10/13/2019 Time of Session: 9:40am Caroline Harvey MRN: [id number]  Principal Diagnosis: <principal problem not specified>  Secondary Diagnoses: Active Problems:   Substance induced mood disorder (HCC)   MDD (major depressive disorder), recurrent severe, without psychosis ([place])   Current Medications:  Current Facility-Administered Medications  Medication Dose Route Frequency Provider Last Rate Last Admin  . acetaminophen (TYLENOL) tablet 650 mg  650 mg Oral Q6H PRN Sharma Covert, MD   650 mg at 10/13/19 0954  . alum & mag hydroxide-simeth (MAALOX/MYLANTA) 200-200-20 MG/5ML suspension 30 mL  30 mL Oral Q4H PRN Sharma Covert, MD      . amLODipine (NORVASC) tablet 5 mg  5 mg Oral Daily Sharma Covert, MD   5 mg at 10/13/19 0950  . dicyclomine (BENTYL) tablet 20 mg  20 mg Oral Q6H PRN Sharma Covert, MD      . folic acid (FOLVITE) tablet 1 mg  1 mg Oral Daily Sharma Covert, MD   1 mg at 10/13/19 1093  . hydrOXYzine (ATARAX/VISTARIL) tablet 25 mg  25 mg Oral TID PRN Sharma Covert, MD      . hydrOXYzine (ATARAX/VISTARIL) tablet 25 mg  25 mg Oral Q6H PRN Sharma Covert, MD   25 mg at 10/12/19 2346  . loperamide (IMODIUM) capsule 2-4 mg  2-4 mg Oral PRN Sharma Covert, MD      . LORazepam (ATIVAN) tablet 1 mg  1 mg Oral Q6H PRN Sharma Covert, MD   1 mg at 10/12/19 2038  . magnesium hydroxide (MILK OF MAGNESIA) suspension 30 mL  30 mL Oral Daily PRN Sharma Covert, MD      . methocarbamol (ROBAXIN) tablet 500 mg  500 mg Oral Q8H PRN Sharma Covert, MD   500 mg at 10/13/19 0954  . naproxen (NAPROSYN) tablet 500 mg  500 mg Oral BID PRN Sharma Covert, MD   500 mg at 10/12/19 2038  . ondansetron (ZOFRAN-ODT) disintegrating tablet 4 mg  4 mg Oral Q6H PRN Sharma Covert, MD      . thiamine tablet 100 mg  100 mg Oral Daily Sharma Covert, MD   100 mg at 10/13/19 2355  . traZODone  (DESYREL) tablet 50 mg  50 mg Oral QHS PRN Sharma Covert, MD   50 mg at 10/12/19 2346   PTA Medications: Medications Prior to Admission  Medication Sig Dispense Refill Last Dose  . amLODipine (NORVASC) 5 MG tablet Take 1 tablet (5 mg total) by mouth daily. 30 tablet 0   . traZODone (DESYREL) 50 MG tablet Take 1 tablet (50 mg total) by mouth at bedtime as needed and may repeat dose one time if needed for sleep. 30 tablet 0     Patient Stressors: Financial difficulties Substance abuse  Patient Strengths: Ability for insight Active sense of humor Average or above average intelligence Capable of independent living Motivation for treatment/growth  Treatment Modalities: Medication Management, Group therapy, Case management,  1 to 1 session with clinician, Psychoeducation, Recreational therapy.   Physician Treatment Plan for Primary Diagnosis: <principal problem not specified> Long Term Goal(s): Improvement in symptoms so as ready for discharge Improvement in symptoms so as ready for discharge   Short Term Goals: Ability to identify changes in lifestyle to reduce recurrence of condition will improve Ability to verbalize feelings will improve Ability to disclose and discuss suicidal ideas Ability to demonstrate self-control will improve Ability to  identify and develop effective coping behaviors will improve Ability to maintain clinical measurements within normal limits will improve Compliance with prescribed medications will improve Ability to identify triggers associated with substance abuse/mental health issues will improve Ability to identify changes in lifestyle to reduce recurrence of condition will improve Ability to verbalize feelings will improve Ability to disclose and discuss suicidal ideas Ability to demonstrate self-control will improve Ability to identify and develop effective coping behaviors will improve Ability to maintain clinical measurements within normal limits  will improve Compliance with prescribed medications will improve Ability to identify triggers associated with substance abuse/mental health issues will improve  Medication Management: Evaluate patient's response, side effects, and tolerance of medication regimen.  Therapeutic Interventions: 1 to 1 sessions, Unit Group sessions and Medication administration.  Evaluation of Outcomes: Not Met  Physician Treatment Plan for Secondary Diagnosis: Active Problems:   Substance induced mood disorder (HCC)   MDD (major depressive disorder), recurrent severe, without psychosis ([place])  Long Term Goal(s): Improvement in symptoms so as ready for discharge Improvement in symptoms so as ready for discharge   Short Term Goals: Ability to identify changes in lifestyle to reduce recurrence of condition will improve Ability to verbalize feelings will improve Ability to disclose and discuss suicidal ideas Ability to demonstrate self-control will improve Ability to identify and develop effective coping behaviors will improve Ability to maintain clinical measurements within normal limits will improve Compliance with prescribed medications will improve Ability to identify triggers associated with substance abuse/mental health issues will improve Ability to identify changes in lifestyle to reduce recurrence of condition will improve Ability to verbalize feelings will improve Ability to disclose and discuss suicidal ideas Ability to demonstrate self-control will improve Ability to identify and develop effective coping behaviors will improve Ability to maintain clinical measurements within normal limits will improve Compliance with prescribed medications will improve Ability to identify triggers associated with substance abuse/mental health issues will improve     Medication Management: Evaluate patient's response, side effects, and tolerance of medication regimen.  Therapeutic Interventions: 1 to 1 sessions,  Unit Group sessions and Medication administration.  Evaluation of Outcomes: Not Met   RN Treatment Plan for Primary Diagnosis: <principal problem not specified> Long Term Goal(s): Knowledge of disease and therapeutic regimen to maintain health will improve  Short Term Goals: Ability to participate in decision making will improve, Ability to disclose and discuss suicidal ideas, Ability to identify and develop effective coping behaviors will improve and Compliance with prescribed medications will improve  Medication Management: RN will administer medications as ordered by provider, will assess and evaluate patient's response and provide education to patient for prescribed medication. RN will report any adverse and/or side effects to prescribing provider.  Therapeutic Interventions: 1 on 1 counseling sessions, Psychoeducation, Medication administration, Evaluate responses to treatment, Monitor vital signs and CBGs as ordered, Perform/monitor CIWA, COWS, AIMS and Fall Risk screenings as ordered, Perform wound care treatments as ordered.  Evaluation of Outcomes: Not Met   LCSW Treatment Plan for Primary Diagnosis: <principal problem not specified> Long Term Goal(s): Safe transition to appropriate next level of care at discharge, Engage patient in therapeutic group addressing interpersonal concerns.  Short Term Goals: Engage patient in aftercare planning with referrals and resources  Therapeutic Interventions: Assess for all discharge needs, 1 to 1 time with Social worker, Explore available resources and support systems, Assess for adequacy in community support network, Educate family and significant other(s) on suicide prevention, Complete Psychosocial Assessment, Interpersonal group therapy.  Evaluation of  Outcomes: Not Met   Progress in Treatment: Attending groups: No. New to unit Participating in groups: No. Taking medication as prescribed: Yes. Toleration medication:  Yes. Family/Significant other contact made: No, will contact:  if patient consents to collateral contacts Patient understands diagnosis: Yes. Discussing patient identified problems/goals with staff: Yes. Medical problems stabilized or resolved: Yes. Denies suicidal/homicidal ideation: Yes. Issues/concerns per patient self-inventory: No. Other:   New problem(s) identified: None   New Short Term/Long Term Goal(s): Detox, medication stabilization, elimination of SI thoughts, development of comprehensive mental wellness plan.    Patient Goals: "I want to get better and to never come back"  Discharge Plan or Barriers: Patient recently admitted. CSW will continue to follow and assess for appropriate referrals and possible discharge planning.    Reason for Continuation of Hospitalization: Anxiety Depression Medication stabilization Suicidal ideation Withdrawal symptoms  Estimated Length of Stay: 3-5 days   Attendees: Patient: Caroline Harvey 10/13/2019 11:51 AM  Physician:  10/13/2019 11:51 AM  Nursing:  10/13/2019 11:51 AM  RN Care Manager: 10/13/2019 11:51 AM  Social Worker: Radonna Ricker, LCSW 10/13/2019 11:51 AM  Recreational Therapist:  10/13/2019 11:51 AM  Other: Marvia Pickles, NP 10/13/2019 11:51 AM  Other:  10/13/2019 11:51 AM  Other: 10/13/2019 11:51 AM    Scribe for Treatment Team: Marylee Floras, [place] 10/13/2019 11:51 AM

## 2019-10-13 NOTE — Progress Notes (Signed)
[facility] MD Progress Note  10/13/2019 4:28 PM Caroline Harvey  MRN:  [id number]  Subjective: Caroline Harvey reports, "I'm doing pretty good today. I came to the hospital because I got upset & took some pills">  Objective: Patient is a 59 year old female with a past psychiatric history significant for opiate dependence, cocaine dependence, alcohol dependence who presented to the behavioral health hospital requesting assistance with her ongoing substance abuse issues. She stated that she was drinking between 4 to 6-4 ounce beers a day. She also admitted to using heroin the day prior to admission. She also admitted to cocaine usage. Caroline Harvey is seen, chart reviewed. The chart findings discussed with the treatment team. She presents alert, oriented & aware of situation. She is visible on the unit, attending group sessions. She is verbally responsive, making good eye contact. She says she is doing pretty good today. Says she came to the hospital this time around because she took some pills in an overdose attempt after she got very upset. Says she was with a man at the time that called 911 & her girlfriend gave her some kind of injection to revive her. She thinks she probably has learned her lesson to not play with her life. She denies any anxiety issues or symptoms of depression. She currently denies any SIHI, AVH, delusional thoughts or paranoia. She does not appear to be responding to any internal stimuli. She is complaining of arthritis pain to her lower extremities. She is reminded that she does has some prn medications ordered for minor aches & pains. Caroline Harvey is in agreement to continue current plan of care as already in progress.  Principal Problem: MDD (major depressive disorder), recurrent severe, without psychosis ([place])  Diagnosis: Principal Problem:   MDD (major depressive disorder), recurrent severe, without psychosis ([place]) Active Problems:   Substance induced mood disorder ([place])  Total Time spent with  patient: 25 minutes  Past Psychiatric History: See H&P  Past Medical History:  Past Medical History:  Diagnosis Date  . Alcohol abuse   . Anemia   . Hypertension   . Hyponatremia 09/2014    Past Surgical History:  Procedure Laterality Date  . ANKLE SURGERY Right    x  2   Family History:  Family History  Problem Relation Age of Onset  . Hypertension Mother   . Breast cancer Paternal Aunt        ? age of onset  . Breast cancer Paternal Aunt        ? age of onset   Family Psychiatric  History: See H&P  Social History:  Social History   Substance and Sexual Activity  Alcohol Use Yes   Comment: Quit May 20 2015     Social History   Substance and Sexual Activity  Drug Use No    Social History   Socioeconomic History  . Marital status: Single    Spouse name: Not on file  . Number of children: Not on file  . Years of education: Not on file  . Highest education level: Not on file  Occupational History  . Not on file  Tobacco Use  . Smoking status: Never Smoker  . Smokeless tobacco: Never Used  Substance and Sexual Activity  . Alcohol use: Yes    Comment: Quit May 20 2015  . Drug use: No  . Sexual activity: Not on file  Other Topics Concern  . Not on file  Social History Narrative  . Not on file   Social Determinants of  Health   Financial Resource Strain:   . Difficulty of Paying Living Expenses:   Food Insecurity:   . Worried About Charity fundraiser in the Last Year:   . Arboriculturist in the Last Year:   Transportation Needs:   . Film/video editor (Medical):   Marland Kitchen Lack of Transportation (Non-Medical):   Physical Activity:   . Days of Exercise per Week:   . Minutes of Exercise per Session:   Stress:   . Feeling of Stress :   Social Connections:   . Frequency of Communication with Friends and Family:   . Frequency of Social Gatherings with Friends and Family:   . Attends Religious Services:   . Active Member of Clubs or Organizations:   .  Attends Archivist Meetings:   Marland Kitchen Marital Status:    Additional Social History:  Pain Medications: See MAR Prescriptions: See MAR Over the Counter: See MAR History of alcohol / drug use?: Yes  Sleep: Good  Appetite:  Good  Current Medications: Current Facility-Administered Medications  Medication Dose Route Frequency Provider Last Rate Last Admin  . acetaminophen (TYLENOL) tablet 650 mg  650 mg Oral Q6H PRN Sharma Covert, MD   650 mg at 10/13/19 0954  . alum & mag hydroxide-simeth (MAALOX/MYLANTA) 200-200-20 MG/5ML suspension 30 mL  30 mL Oral Q4H PRN Sharma Covert, MD      . amLODipine (NORVASC) tablet 5 mg  5 mg Oral Daily Sharma Covert, MD   5 mg at 10/13/19 0950  . dicyclomine (BENTYL) tablet 20 mg  20 mg Oral Q6H PRN Sharma Covert, MD      . folic acid (FOLVITE) tablet 1 mg  1 mg Oral Daily Sharma Covert, MD   1 mg at 10/13/19 [id number]  . hydrOXYzine (ATARAX/VISTARIL) tablet 25 mg  25 mg Oral TID PRN Sharma Covert, MD      . hydrOXYzine (ATARAX/VISTARIL) tablet 25 mg  25 mg Oral Q6H PRN Sharma Covert, MD   25 mg at 10/12/19 2346  . loperamide (IMODIUM) capsule 2-4 mg  2-4 mg Oral PRN Sharma Covert, MD      . LORazepam (ATIVAN) tablet 1 mg  1 mg Oral Q6H PRN Sharma Covert, MD   1 mg at 10/12/19 2038  . magnesium hydroxide (MILK OF MAGNESIA) suspension 30 mL  30 mL Oral Daily PRN Sharma Covert, MD      . methocarbamol (ROBAXIN) tablet 500 mg  500 mg Oral Q8H PRN Sharma Covert, MD   500 mg at 10/13/19 0954  . naproxen (NAPROSYN) tablet 500 mg  500 mg Oral BID PRN Sharma Covert, MD   500 mg at 10/12/19 2038  . ondansetron (ZOFRAN-ODT) disintegrating tablet 4 mg  4 mg Oral Q6H PRN Sharma Covert, MD      . thiamine tablet 100 mg  100 mg Oral Daily Sharma Covert, MD   100 mg at 10/13/19 [id number]  . traZODone (DESYREL) tablet 50 mg  50 mg Oral QHS PRN Sharma Covert, MD   50 mg at 10/12/19 2346    Lab Results:   Results for orders placed or performed during the hospital encounter of 10/11/19 (from the past 48 hour(s))  CBC     Status: Abnormal   Collection Time: 10/11/19  4:53 PM  Result Value Ref Range   WBC 3.7 (L) 4.0 - 10.5 K/uL   RBC 4.30 3.87 -  5.11 MIL/uL   Hemoglobin 12.9 12.0 - 15.0 g/dL   HCT 40.9 36.0 - 46.0 %   MCV 95.1 80.0 - 100.0 fL   MCH 30.0 26.0 - 34.0 pg   MCHC 31.5 30.0 - 36.0 g/dL   RDW 15.0 11.5 - 15.5 %   Platelets 229 150 - 400 K/uL   nRBC 0.0 0.0 - 0.2 %    Comment: Performed at [facility], [place] [address] [place], [place] [address]  Comprehensive metabolic panel     Status: Abnormal   Collection Time: 10/11/19  4:53 PM  Result Value Ref Range   Sodium 131 (L) 135 - 145 mmol/L   Potassium 4.7 3.5 - 5.1 mmol/L   Chloride 96 (L) 98 - 111 mmol/L   CO2 26 22 - 32 mmol/L   Glucose, Bld 107 (H) 70 - 99 mg/dL    Comment: Glucose reference range applies only to samples taken after fasting for at least 8 hours.   BUN 18 6 - 20 mg/dL   Creatinine, Ser 1.05 (H) 0.44 - 1.00 mg/dL   Calcium 9.1 8.9 - 10.3 mg/dL   Total Protein 7.8 6.5 - 8.1 g/dL   Albumin 4.1 3.5 - 5.0 g/dL   AST 19 15 - 41 U/L   ALT 9 0 - 44 U/L   Alkaline Phosphatase 73 38 - 126 U/L   Total Bilirubin 0.7 0.3 - 1.2 mg/dL   GFR calc non Af Amer 59 (L) >60 mL/min   GFR calc Af Amer >60 >60 mL/min   Anion gap 9 5 - 15    Comment: Performed at [facility], [place] [address], [place], [place] [address]  Ethanol     Status: None   Collection Time: 10/11/19  4:53 PM  Result Value Ref Range   Alcohol, Ethyl (B) <10 <10 mg/dL    Comment: (NOTE) Lowest detectable limit for serum alcohol is 10 mg/dL. For medical purposes only. Performed at [facility], [place] [address] [place], [place] [address]   Urinalysis, Complete w Microscopic     Status: Abnormal   Collection Time: 10/12/19  5:49 AM  Result Value Ref Range   Color, Urine STRAW (A) YELLOW    APPearance CLEAR CLEAR   Specific Gravity, Urine 1.009 1.005 - 1.030   pH 6.0 5.0 - 8.0   Glucose, UA NEGATIVE NEGATIVE mg/dL   Hgb urine dipstick SMALL (A) NEGATIVE   Bilirubin Urine NEGATIVE NEGATIVE   Ketones, ur NEGATIVE NEGATIVE mg/dL   Protein, ur NEGATIVE NEGATIVE mg/dL   Nitrite NEGATIVE NEGATIVE   Leukocytes,Ua NEGATIVE NEGATIVE   WBC, UA 0-5 0 - 5 WBC/hpf   Bacteria, UA NONE SEEN NONE SEEN    Comment: Performed at [facility], [place] [address], [place], [place] [address]  Urine rapid drug screen (hosp performed)not at [facility]     Status: Abnormal   Collection Time: 10/12/19  5:49 AM  Result Value Ref Range   Opiates NONE DETECTED NONE DETECTED   Cocaine POSITIVE (A) NONE DETECTED   Benzodiazepines NONE DETECTED NONE DETECTED   Amphetamines NONE DETECTED NONE DETECTED   Tetrahydrocannabinol NONE DETECTED NONE DETECTED   Barbiturates NONE DETECTED NONE DETECTED    Comment: (NOTE) DRUG SCREEN FOR MEDICAL PURPOSES ONLY.  IF CONFIRMATION IS NEEDED FOR ANY PURPOSE, NOTIFY LAB WITHIN 5 DAYS. LOWEST DETECTABLE LIMITS FOR URINE DRUG SCREEN Drug Class  Cutoff (ng/mL) Amphetamine and metabolites    1000 Barbiturate and metabolites    200 Benzodiazepine                 [id number] Tricyclics and metabolites     300 Opiates and metabolites        300 Cocaine and metabolites        300 THC                            50 Performed at [facility], [place] [address] [place], [place] [address]   Vitamin B12     Status: Abnormal   Collection Time: 10/12/19  6:23 PM  Result Value Ref Range   Vitamin B-12 1,044 (H) 180 - 914 pg/mL    Comment: (NOTE) This assay is not validated for testing neonatal or myeloproliferative syndrome specimens for Vitamin B12 levels. Performed at [facility], [place] [address], [place], [place] [address]   Folate     Status: None   Collection Time: 10/12/19  6:23 PM  Result Value Ref Range    Folate 9.1 >5.9 ng/mL    Comment: Performed at [facility], [place] [address], [place], [place] [address]  Iron and TIBC     Status: None   Collection Time: 10/12/19  6:23 PM  Result Value Ref Range   Iron 54 28 - 170 ug/dL   TIBC 356 250 - 450 ug/dL   Saturation Ratios 15 10.4 - 31.8 %   UIBC 302 ug/dL    Comment: Performed at [facility]: [place] [address], [place] [address] [place], [place] [address]  Ferritin     Status: None   Collection Time: 10/12/19  6:23 PM  Result Value Ref Range   Ferritin 29 11 - 307 ng/mL    Comment: Performed at [facility], [place] [address], [place], [place] [address]  Reticulocytes     Status: None   Collection Time: 10/12/19  6:23 PM  Result Value Ref Range   Retic Ct Pct 1.0 0.4 - 3.1 %   RBC. 3.99 3.87 - 5.11 MIL/uL   Retic Count, Absolute 41.5 19.0 - 186.0 K/uL   Immature Retic Fract 3.4 2.3 - 15.9 %    Comment: Performed at [facility], [place] [address], [place], [place] [address]    Blood Alcohol level:  Lab Results  Component Value Date   ETH <10 10/11/2019   ETH <10 [id number]    Metabolic Disorder Labs: Lab Results  Component Value Date   HGBA1C 5.0 07/29/2019   MPG 96.8 07/29/2019   No results found for: PROLACTIN Lab Results  Component Value Date   CHOL 161 07/29/2019   TRIG 31 07/29/2019   HDL 96 07/29/2019   CHOLHDL 1.7 07/29/2019   VLDL 6 07/29/2019   LDLCALC 59 07/29/2019   LDLCALC 31 01/18/2008   Physical Findings: AIMS: Facial and Oral Movements Muscles of Facial Expression: None, normal Lips and Perioral Area: None, normal Jaw: None, normal Tongue: None, normal,Extremity Movements Upper (arms, wrists, hands, fingers): None, normal Lower (legs, knees, ankles, toes): None, normal, Trunk Movements Neck, shoulders, hips: None, normal, Overall Severity Severity of abnormal movements (highest score from questions above): None, normal Incapacitation due to  abnormal movements: None, normal Patient's awareness of abnormal movements (rate only patient's report): No Awareness, Dental Status Current problems with teeth and/or dentures?: No Does patient usually wear dentures?: No  CIWA:  CIWA-Ar Total: 8 COWS:  COWS Total Score: 0  Musculoskeletal: Strength & Muscle Tone: within normal limits Gait & Station: normal Patient leans: N/A  Psychiatric Specialty Exam: Physical Exam  Nursing note and vitals reviewed. Constitutional: She is oriented to person, place, and time. She appears well-developed.  Eyes: Pupils are equal, round, and reactive to light.  Cardiovascular:  Elevated blood pressure: 142/82  Respiratory: Effort normal.  Genitourinary:    Genitourinary Comments: Deferred   Musculoskeletal:        General: Normal range of motion.     Cervical back: Normal range of motion.  Neurological: She is alert and oriented to person, place, and time.  Skin: Skin is warm and dry.    Review of Systems  Constitutional: Negative for chills and diaphoresis.  HENT: Negative for congestion, rhinorrhea and sneezing.   Respiratory: Negative for cough, shortness of breath and wheezing.   Cardiovascular: Negative for chest pain and palpitations.  Gastrointestinal: Negative for diarrhea, nausea and vomiting.  Genitourinary: Negative for difficulty urinating.  Musculoskeletal: Positive for arthralgias and myalgias.  Skin: Negative for color change.  Allergic/Immunologic: Negative for environmental allergies and food allergies.       NKDA  Neurological: Negative for dizziness, tremors, seizures, syncope, light-headedness, numbness and headaches.  Psychiatric/Behavioral: Negative for agitation, behavioral problems, confusion, decreased concentration, dysphoric mood, hallucinations, self-injury, sleep disturbance and suicidal ideas. The patient is not nervous/anxious and is not hyperactive.     Blood pressure (!) 143/82, pulse 64, temperature 98.3 F  (36.8 C), temperature source Oral, resp. rate 18, height 5\' 9"  (1.753 m), weight 97.1 kg, last menstrual period 12/14/2010, SpO2 99 %.Body mass index is 31.6 kg/m.  General Appearance: Fairly groomed, obese  Eye Contact:  Fair  Speech:  Clear and Coherent and Normal Rate  Volume:  Normal  Mood:  "I feel pretty good today"  Affect:  Appropriate and reactive  Thought Process:  Coherent, Goal Directed and Descriptions of Associations: Intact  Orientation:  Full (Time, Place, and Person)  Thought Content:  Logical, denies any hallucinations, delusions or paranoia.  Suicidal Thoughts:  Currently denies any thoughts, plans or intent.  Homicidal Thoughts:  Denies  Memory:  Immediate;   Good Recent;   Good Remote;   Good  Judgement:  Intact  Insight:  Present  Psychomotor Activity:  Normal  Concentration:  Concentration: Good and Attention Span: Good  Recall:  Good  Fund of Knowledge:  Good  Language:  Good  Akathisia:  NA  Handed:  Right  AIMS (if indicated):     Assets:  Communication Skills Desire for Improvement Physical Health  ADL's:  Intact  Cognition:  WNL  Sleep:  Number of Hours: 5   Treatment Plan Summary: Daily contact with patient to assess and evaluate symptoms and progress in treatment and Medication management.  - Continue inpatient hospitalization.  - Will continue today 10/13/2019 plan as below except where it is noted.  Substance withdrawal symptoms.     - Continue Ativan 1 mg po Q 6 hrs prn for CIWA >10.     - Continue imodium 2-4 mg po prn for loose stools.     - Continue Zofran - ODT 4 mg po Q 6 hrs prn for n/v.     - Continue Thiamine 100 mg po daily for thiamine replacement.     - Continue methocarbamol 500 mg po Q hrs prn for muscle spasms.     - Continue bentyl 20 mg po Q  6 hrs prn for stomach cramps.     - Continue folic acid 1 mg po daily for folate replacement.     Anxiety.     - Continue Vistaril 25 mg po tid prn.  Insomnia.     - Continue  Trazodone 50 mg po Q hs prn.  - Continue the routine prn medications fpr minor aches & pains.  - Encourage group session attendance & participation. - Discharge disposition plan is ongoing.  Lindell Spar, NP, PMHNP, FNP-BC 10/13/2019, 4:28 PMPatient ID: Caroline Harvey, female   DOB: [birth year], 59 y.o.   MRN: [id number]

## 2019-10-13 NOTE — B[facility] Group Notes (Addendum)
10/13/2019 8:45am Type of Group and Topic: Psychoeducational Group: Discharge Planning  Participation Level: Active  Description of Group Discharge planning group reviews patient's anticipated discharge plans and assists patients to anticipate and address any barriers to wellness/recovery in the community. Suicide prevention education is reviewed with patients in group. Therapeutic Goals 1. Patients will state their anticipated discharge plan and mental health aftercare 2. Patients will identify potential barriers to wellness in the community setting 3. Patients will engage in problem solving, solution focused discussion of ways to anticipate and address barriers to wellness/recovery   Summary of Patient Progress Plan for Discharge/Comments: Caroline Harvey reports she does not have any concerns regarding discharge at this time. She shared she may be interested in residential treatment services.   Transportation Means: Patient is unsure at this time.    Supports: To be determined    Therapeutic Modalities: Motivational Interviewing     Caroline Harvey, MSW, [place] Worker [facility]  Phone: [phone] 10/13/2019 2:18 PM

## 2019-10-13 NOTE — B[facility] Group Notes (Signed)
Adult Psychoeducational Group Note  Date:  10/13/2019 Time:  9:36 PM  Group Topic/Focus:  Wrap-Up Group:   The focus of this group is to help patients review their daily goal of treatment and discuss progress on daily workbooks.  Participation Level:  Active  Participation Quality:  Appropriate and Attentive  Affect:  Appropriate  Cognitive:  Alert and Appropriate  Insight: Appropriate and Good  Engagement in Group:  Engaged  Modes of Intervention:  Discussion and Education  Additional Comments:  Pt attended and participated in wrap up group this evening and rated their day an 8/10 due to them seeing staff they took care of them at their last admission. Pt goal while they are here it to get resources for housing, and family support.   DEILANI JETTY 10/13/2019, 9:36 PM

## 2019-10-13 NOTE — Progress Notes (Signed)
   10/13/19 0949  Vital Signs  Pulse Rate 64  BP (!) 143/82  BP Location Left Arm  BP Method Automatic  Pain Assessment  Pain Scale 0-10  Pain Score 9  Pain Intervention(s) Medication (See eMAR)  D: Patient Presents with appropriate mood and affect.  Patient was calm and cooperative during med pass and took medicine without incident.  Patient was out in open areas and was social with peers and staff.  Patient denies suicidal thoughts and self harming thoughts. Patient complained of backpain 10/10 650 tylenol given and 500mg  Robaxin given. .A:  Patient took scheduled medicine.  Support and encouragement provided Routine safety checks conducted every 15 minutes. Patient  Informed to notify staff with any concerns.  Safety maintained. R:  No adverse drug reactions noted.  Patient contracts for safety.  Patient compliant with medication and treatment plan. Patient cooperative and calm. Patient interacts well with others on the unit.  Safety maintained.

## 2019-10-13 NOTE — Progress Notes (Signed)
Patient slept throughout the night. Had no sign of distress while asleep. Woke up for breakfast and reported that she rested well. Pleasant and cooperative and denying thoughts of self harm. Encouragements and support provided. Safety monitored as ordered.

## 2019-10-13 NOTE — Plan of Care (Signed)
Newly admitted. Presents with increased anxiety, restlessness and fatigue. Reporting that she is tired and wants to go to bed. BP 160/93. NP notified: patient received Clonidine, Vistaril and Trazodone as prescribed. Currently in bed resting.

## 2019-10-14 MED ORDER — TRAZODONE HCL 100 MG PO TABS
100.0000 mg | ORAL_TABLET | Freq: Every day | ORAL | Status: DC
  Administered 2019-10-14: 100 mg via ORAL
  Filled 2019-10-14 (×3): qty 1

## 2019-10-14 MED ORDER — HYDROXYZINE HCL 50 MG PO TABS
50.0000 mg | ORAL_TABLET | Freq: Every day | ORAL | Status: DC
  Administered 2019-10-14: 50 mg via ORAL
  Filled 2019-10-14 (×5): qty 1

## 2019-10-14 NOTE — Progress Notes (Signed)
[facility] MD Progress Note  10/14/2019 11:01 AM Caroline Harvey  MRN:  [id number]  Subjective: Caroline Harvey reports, "I'm doing well today, I just did not sleep well last night".  Objective: Patient is a 59 year old female with a past psychiatric history significant for opiate dependence, cocaine dependence, alcohol dependence who presented to the behavioral health hospital requesting assistance with her ongoing substance abuse issues. She stated that she was drinking between 4 to 6-4 ounce beers a day. She also admitted to using heroin the day prior to admission. She also admitted to cocaine usage. Caroline Harvey is seen, chart reviewed. The chart findings discussed with the treatment team. She presents alert, oriented & aware of situation. She is visible on the unit, attending group sessions. She is verbally responsive, making good eye contact. She says she is doing well today. However, complained she did not sleep well last night. Says she can be discharged tomorrow morning if she can sleep well tonight. Her medications has been adjusted to help meet this need tonight. At this time, patient presents with a bright affect, good eye contact, showing no signs of depression or anxiety. She also states that she really does not need medication for depression as she feels she is not that depressed. She currently denies any SIHI, AVH, delusional thoughts or paranoia. She does not appear to be responding to any internal stimuli. Caroline Harvey is in agreement to continue current plan of care as already in progress.  Principal Problem: MDD (major depressive disorder), recurrent severe, without psychosis (Caroline Harvey)  Diagnosis: Principal Problem:   MDD (major depressive disorder), recurrent severe, without psychosis ([place]) Active Problems:   Substance induced mood disorder ([place])  Total Time spent with patient: 25 minutes  Past Psychiatric History: See H&P  Past Medical History:  Past Medical History:  Diagnosis Date  . Alcohol abuse    . Anemia   . Hypertension   . Hyponatremia 09/2014    Past Surgical History:  Procedure Laterality Date  . ANKLE SURGERY Right    x  2   Family History:  Family History  Problem Relation Age of Onset  . Hypertension Mother   . Breast cancer Paternal Aunt        ? age of onset  . Breast cancer Paternal Aunt        ? age of onset   Family Psychiatric  History: See H&P  Social History:  Social History   Substance and Sexual Activity  Alcohol Use Yes   Comment: Quit May 20 2015     Social History   Substance and Sexual Activity  Drug Use No    Social History   Socioeconomic History  . Marital status: Single    Spouse name: Not on file  . Number of children: Not on file  . Years of education: Not on file  . Highest education level: Not on file  Occupational History  . Not on file  Tobacco Use  . Smoking status: Never Smoker  . Smokeless tobacco: Never Used  Substance and Sexual Activity  . Alcohol use: Yes    Comment: Quit May 20 2015  . Drug use: No  . Sexual activity: Not on file  Other Topics Concern  . Not on file  Social History Narrative  . Not on file   Social Determinants of Health   Financial Resource Strain:   . Difficulty of Paying Living Expenses:   Food Insecurity:   . Worried About Charity fundraiser in the Last Year:   .  Ran Out of Food in the Last Year:   Transportation Needs:   . Film/video editor (Medical):   Marland Kitchen Lack of Transportation (Non-Medical):   Physical Activity:   . Days of Exercise per Week:   . Minutes of Exercise per Session:   Stress:   . Feeling of Stress :   Social Connections:   . Frequency of Communication with Friends and Family:   . Frequency of Social Gatherings with Friends and Family:   . Attends Religious Services:   . Active Member of Clubs or Organizations:   . Attends Archivist Meetings:   Marland Kitchen Marital Status:    Additional Social History:  Pain Medications: See MAR Prescriptions: See  MAR Over the Counter: See MAR History of alcohol / drug use?: Yes  Sleep: Good  Appetite:  Good  Current Medications: Current Facility-Administered Medications  Medication Dose Route Frequency Provider Last Rate Last Admin  . acetaminophen (TYLENOL) tablet 650 mg  650 mg Oral Q6H PRN Sharma Covert, MD   650 mg at 10/13/19 0954  . alum & mag hydroxide-simeth (MAALOX/MYLANTA) 200-200-20 MG/5ML suspension 30 mL  30 mL Oral Q4H PRN Sharma Covert, MD      . amLODipine (NORVASC) tablet 5 mg  5 mg Oral Daily Sharma Covert, MD   5 mg at 10/14/19 [id number]  . dicyclomine (BENTYL) tablet 20 mg  20 mg Oral Q6H PRN Sharma Covert, MD      . folic acid (FOLVITE) tablet 1 mg  1 mg Oral Daily Sharma Covert, MD   1 mg at 10/14/19 [id number]  . hydrOXYzine (ATARAX/VISTARIL) tablet 25 mg  25 mg Oral Q6H PRN Sharma Covert, MD   25 mg at 10/13/19 2119  . hydrOXYzine (ATARAX/VISTARIL) tablet 50 mg  50 mg Oral QHS Keaton Beichner I, NP      . loperamide (IMODIUM) capsule 2-4 mg  2-4 mg Oral PRN Sharma Covert, MD      . LORazepam (ATIVAN) tablet 1 mg  1 mg Oral Q6H PRN Sharma Covert, MD   1 mg at 10/14/19 0046  . magnesium hydroxide (MILK OF MAGNESIA) suspension 30 mL  30 mL Oral Daily PRN Sharma Covert, MD      . methocarbamol (ROBAXIN) tablet 500 mg  500 mg Oral Q8H PRN Sharma Covert, MD   500 mg at 10/14/19 [id number]  . naproxen (NAPROSYN) tablet 500 mg  500 mg Oral BID PRN Sharma Covert, MD   500 mg at 10/14/19 0932  . ondansetron (ZOFRAN-ODT) disintegrating tablet 4 mg  4 mg Oral Q6H PRN Sharma Covert, MD      . thiamine tablet 100 mg  100 mg Oral Daily Sharma Covert, MD   100 mg at 10/14/19 0826  . traZODone (DESYREL) tablet 100 mg  100 mg Oral QHS Caroline Harvey I, NP        Lab Results:  Results for orders placed or performed during the hospital encounter of 10/11/19 (from the past 48 hour(s))  Vitamin B12     Status: Abnormal   Collection Time: 10/12/19   6:23 PM  Result Value Ref Range   Vitamin B-12 1,044 (H) 180 - 914 pg/mL    Comment: (NOTE) This assay is not validated for testing neonatal or myeloproliferative syndrome specimens for Vitamin B12 levels. Performed at [facility], [place] [address] [place], [place] [address]   Folate     Status: None  Collection Time: 10/12/19  6:23 PM  Result Value Ref Range   Folate 9.1 >5.9 ng/mL    Comment: Performed at [facility], [place] [address], [place], [place] [address]  Iron and TIBC     Status: None   Collection Time: 10/12/19  6:23 PM  Result Value Ref Range   Iron 54 28 - 170 ug/dL   TIBC 356 250 - 450 ug/dL   Saturation Ratios 15 10.4 - 31.8 %   UIBC 302 ug/dL    Comment: Performed at [facility], [place] [address], [place], [place] [address]  Ferritin     Status: None   Collection Time: 10/12/19  6:23 PM  Result Value Ref Range   Ferritin 29 11 - 307 ng/mL    Comment: Performed at [facility], [place] [address] [place], [place] [address]  Reticulocytes     Status: None   Collection Time: 10/12/19  6:23 PM  Result Value Ref Range   Retic Ct Pct 1.0 0.4 - 3.1 %   RBC. 3.99 3.87 - 5.11 MIL/uL   Retic Count, Absolute 41.5 19.0 - 186.0 K/uL   Immature Retic Fract 3.4 2.3 - 15.9 %    Comment: Performed at [facility], [place] [address], [place], [place] [address]    Blood Alcohol level:  Lab Results  Component Value Date   ETH <10 10/11/2019   ETH <10 [id number]    Metabolic Disorder Labs: Lab Results  Component Value Date   HGBA1C 5.0 07/29/2019   MPG 96.8 07/29/2019   No results found for: PROLACTIN Lab Results  Component Value Date   CHOL 161 07/29/2019   TRIG 31 07/29/2019   HDL 96 07/29/2019   CHOLHDL 1.7 07/29/2019   VLDL 6 07/29/2019   LDLCALC 59 07/29/2019   LDLCALC 31 01/18/2008   Physical Findings: AIMS: Facial and Oral Movements Muscles of Facial Expression:  None, normal Lips and Perioral Area: None, normal Jaw: None, normal Tongue: None, normal,Extremity Movements Upper (arms, wrists, hands, fingers): None, normal Lower (legs, knees, ankles, toes): None, normal, Trunk Movements Neck, shoulders, hips: None, normal, Overall Severity Severity of abnormal movements (highest score from questions above): None, normal Incapacitation due to abnormal movements: None, normal Patient's awareness of abnormal movements (rate only patient's report): No Awareness, Dental Status Current problems with teeth and/or dentures?: No Does patient usually wear dentures?: No  CIWA:  CIWA-Ar Total: 8 COWS:  COWS Total Score: 1  Musculoskeletal: Strength & Muscle Tone: within normal limits Gait & Station: normal Patient leans: N/A  Psychiatric Specialty Exam: Physical Exam  Nursing note and vitals reviewed. Constitutional: She is oriented to person, place, and time. She appears well-developed.  Eyes: Pupils are equal, round, and reactive to light.  Cardiovascular:  Elevated blood pressure: 142/82  Respiratory: Effort normal.  Genitourinary:    Genitourinary Comments: Deferred   Musculoskeletal:        General: Normal range of motion.     Cervical back: Normal range of motion.  Neurological: She is alert and oriented to person, place, and time.  Skin: Skin is warm and dry.    Review of Systems  Constitutional: Negative for chills and diaphoresis.  HENT: Negative for congestion, rhinorrhea and sneezing.   Respiratory: Negative for cough, shortness of breath and wheezing.   Cardiovascular: Negative for chest pain and palpitations.  Gastrointestinal: Negative for diarrhea, nausea and vomiting.  Genitourinary: Negative for difficulty urinating.  Musculoskeletal: Positive for arthralgias  and myalgias.  Skin: Negative for color change.  Allergic/Immunologic: Negative for environmental allergies and food allergies.       NKDA  Neurological: Negative for  dizziness, tremors, seizures, syncope, light-headedness, numbness and headaches.  Psychiatric/Behavioral: Negative for agitation, behavioral problems, confusion, decreased concentration, dysphoric mood, hallucinations, self-injury, sleep disturbance and suicidal ideas. The patient is not nervous/anxious and is not hyperactive.     Blood pressure (!) 137/94, pulse 85, temperature 97.7 F (36.5 C), temperature source Oral, resp. rate 16, height 5\' 9"  (1.753 m), weight 97.1 kg, last menstrual period 12/14/2010, SpO2 99 %.Body mass index is 31.6 kg/m.  General Appearance: Fairly groomed, obese  Eye Contact:  Fair  Speech:  Clear and Coherent and Normal Rate  Volume:  Normal  Mood:  "I feel pretty good today"  Affect:  Appropriate and reactive  Thought Process:  Coherent, Goal Directed and Descriptions of Associations: Intact  Orientation:  Full (Time, Place, and Person)  Thought Content:  Logical, denies any hallucinations, delusions or paranoia.  Suicidal Thoughts:  Currently denies any thoughts, plans or intent.  Homicidal Thoughts:  Denies  Memory:  Immediate;   Good Recent;   Good Remote;   Good  Judgement:  Intact  Insight:  Present  Psychomotor Activity:  Normal  Concentration:  Concentration: Good and Attention Span: Good  Recall:  Good  Fund of Knowledge:  Good  Language:  Good  Akathisia:  NA  Handed:  Right  AIMS (if indicated):     Assets:  Communication Skills Desire for Improvement Physical Health  ADL's:  Intact  Cognition:  WNL  Sleep:  Number of Hours: 6.25   Treatment Plan Summary: Daily contact with patient to assess and evaluate symptoms and progress in treatment and Medication management.  - Continue inpatient hospitalization.  - Will continue today 10/14/2019 plan as below except where it is noted.  Substance withdrawal symptoms.     - Continue Ativan 1 mg po Q 6 hrs prn for CIWA >10.     - Continue imodium 2-4 mg po prn for loose stools.     - Continue  Zofran - ODT 4 mg po Q 6 hrs prn for n/v.     - Continue Thiamine 100 mg po daily for thiamine replacement.     - Continue methocarbamol 500 mg po Q hrs prn for muscle spasms.     - Continue bentyl 20 mg po Q 6 hrs prn for stomach cramps.     - Continue folic acid 1 mg po daily for folate replacement.     Anxiety.     - Continue Vistaril 25 mg po tid prn.  Insomnia.     - Increased/changed Trazodone to 100 mg po Q hs routinely.     - Initiated Vistaril 50 mg po Q hs for insomnia/anxiety.  - Continue the routine prn medications fpr minor aches & pains.  - Encourage group session attendance & participation. - Discharge disposition plan is ongoing.  Caroline Spar, NP, PMHNP, FNP-BC 10/14/2019, 11:01 AMPatient ID: Caroline Harvey, female   DOB: [birth year], 59 y.o.   MRN: [id number] Patient ID: Caroline Harvey, female   DOB: 1960-09-14, 59 y.o.   MRN: [id number]

## 2019-10-14 NOTE — Progress Notes (Signed)
   10/14/19 2200  Psych Admission Type (Psych Patients Only)  Admission Status Voluntary  Psychosocial Assessment  Patient Complaints Depression  Eye Contact Brief  Facial Expression Flat  Affect Appropriate to circumstance  Speech Logical/coherent  Interaction Minimal  Motor Activity Slow  Appearance/Hygiene Unremarkable  Behavior Characteristics Cooperative  Mood Depressed  Thought Process  Coherency WDL  Content WDL  Delusions None reported or observed  Perception WDL  Hallucination None reported or observed  Judgment Impaired  Confusion None  Danger to Self  Current suicidal ideation? Denies  Danger to Others  Danger to Others None reported or observed

## 2019-10-14 NOTE — BHH Counselor (Addendum)
Adult Comprehensive Assessment  Patient ID: Caroline Harvey, female   DOB: [birth year], 59 y.o.   MRN: [id number] Information Source: Information source: Patient  Current Stressors:  Patient states their primary concerns and needs for treatment are:"I took an overdose on Gaba's because I was mad. I also been using cocaine and "boy".  Patient states their goals for this hospitilization and ongoing recovery are: "Get hooked up with some NA meetings. I'm not going to rehab"    Educational / Learning stressors: Denies stressors Employment / Job issues: Employed; Denies stressors Family Relationships: Reports having a strained relationship with most of her family members. She shared that she does not feel like "they care about me"  Financial / Lack of resources (include bankruptcy):Denies any current stressors  Housing / Lack of housing: Lives alone; Denies any current stressors  Physical health (include injuries & life threatening diseases): Denies stressors Social relationships: Denies stressors Substance abuse: Endorsed using cocaine and heroin on a daily basis; Reports using $40 worth of cocaine and $40 worth of heroin on a daily basis. She also endorsed drinking (4) 4-6 oz beers on a daily basis as well.  Bereavement / Loss: Denies stressors  Living/Environment/Situation:  Living Arrangements: Alone Living conditions (as described by patient or guardian): [place] Who else lives in the home?: 2 dogs How long has patient lived in current situation?: 2 years What is atmosphere in current home: Comfortable  Family History:  Marital status: Single What is your sexual orientation?: Gay Does patient have children?: Yes How many children?: 1 How is patient's relationship with their children?: Adult daughter - wonderful relationship, wanted patient to get help  Childhood History:  By whom was/is the patient raised?: Both parents Description of patient's relationship with caregiver when  they were a child: Wonderful relationship with both parents Patient's description of current relationship with people who raised him/her: Wonderful with mother; father is deceased.  Mother does live locally. How were you disciplined when you got in trouble as a child/adolescent?: Fussed at Does patient have siblings?: Yes Number of Siblings: 5 Description of patient's current relationship with siblings: 3 brothers, 2 sisters - lovely relationships Did patient suffer any verbal/emotional/physical/sexual abuse as a child?: No Did patient suffer from severe childhood neglect?: No Has patient ever been sexually abused/assaulted/raped as an adolescent or adult?: No Was the patient ever a victim of a crime or a disaster?: No Witnessed domestic violence?: No Has patient been effected by domestic violence as an adult?: No  Education:  Highest grade of school patient has completed: 2 years college Currently a Ship broker?: No Learning disability?: No  Employment/Work Situation:   Employment situation: Employed 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 has patient been employed?: Since 2004 Patient's job has been impacted by current illness: No What is the longest time patient has a held a job?: 17 years 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 Did You Receive Any Psychiatric Treatment/Services While in Passenger transport manager?: (No Armed forces logistics/support/administrative officer) Are There Guns or Other Weapons in [place]?: No  Financial Resources:   Financial resources: Income from employment Does patient have a representative payee or guardian?: No  Alcohol/Substance Abuse:   What has been your use of drugs/alcohol within the last 12 months?:Endorsed using cocaine and heroin on a daily basis; Reports using $40 worth of cocaine and $40 worth of heroin on a daily basis. She also endorsed drinking (4) 4-6 oz beers on a daily basis as well.  Alcohol/Substance Abuse  Treatment Hx: Past Tx, Inpatient,  Attends AA/NA If yes, describe treatment: [facility] 2 years ago Has alcohol/substance abuse ever caused legal problems?: No  Social Support System:   Patient's Community Support System: Good Describe Community Support System: Family Type of faith/religion: None How does patient's faith help to cope with current illness?: N/A  Leisure/Recreation:   Leisure and Hobbies: "I don't really have any recreation."  Strengths/Needs:   What is the patient's perception of their strengths?: To better myself, get myself together. Patient states they can use these personal strengths during their treatment to contribute to their recovery: Yes Patient states these barriers may affect/interfere with their treatment: None Patient states these barriers may affect their return to the community: None Other important information patient would like considered in planning for their treatment: None  Discharge Plan:   Currently receiving community mental health services: No Patient states concerns and preferences for aftercare planning are: Interested in NA meetings and reports she is interested in [facility] services.  Patient states they will know when they are safe and ready for discharge when: Patient plans to discharge soon Does patient have access to transportation?: Yes Does patient have financial barriers related to discharge medications?: Yes Patient description of barriers related to discharge medications: No insurance, limited income Will patient be returning to same living situation after discharge?: Yes   Summary/Recommendations:   Summary and Recommendations (to be completed by the evaluator): Caroline Harvey is a 59 year old female who is diagnosed with MDD (major depressive disorder), recurrent severe, without psychosis and Substance induced mood disorder. She presented to the hospital seeking treatment for an overdose (suicidal ideation) and ongoing substance abuse issues. During the assessment, Caroline Harvey was  pleasant and cooperative with providing infrmation. Caroline Harvey reports that she "only" overdosed on Gabapentin due to becoming "upset with my family". Caroline Harvey shared that her strained relationship with her family members is her main stressor. 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 Caroline Harvey shared that she would like to have her medications reviewed and adjusted while in the hospital. She requested resources on NA meetings, in addition to outpatient referrals for substance abuse counseling and medication management. Caroline Harvey can benefit from crisis stabilization, medication management, therapeutic milieu and referral services.  Caroline Harvey. 10/14/2019

## 2019-10-14 NOTE — Progress Notes (Signed)
Pt stated she had hard time sleeping last night, pt encouraged to talk to the doctor

## 2019-10-14 NOTE — Progress Notes (Signed)
   10/14/19 0000  Psych Admission Type (Psych Patients Only)  Admission Status Voluntary  Psychosocial Assessment  Patient Complaints Depression  Eye Contact Brief  Facial Expression Flat  Affect Appropriate to circumstance  Speech Logical/coherent  Interaction Minimal  Motor Activity Slow  Appearance/Hygiene Unremarkable  Behavior Characteristics Cooperative  Mood Depressed  Thought Process  Coherency WDL  Content WDL  Delusions None reported or observed  Perception WDL  Hallucination None reported or observed  Judgment Impaired  Confusion None  Danger to Self  Current suicidal ideation? Denies  Danger to Others  Danger to Others None reported or observed

## 2019-10-15 MED ORDER — TRAZODONE HCL 100 MG PO TABS: 100.0000 mg | ORAL_TABLET | Freq: Every day | ORAL | 0 refills | Status: DC

## 2019-10-15 MED ORDER — HYDROXYZINE HCL 25 MG PO TABS: 25.0000 mg | ORAL_TABLET | Freq: Four times a day (QID) | ORAL | 0 refills | Status: DC | PRN

## 2019-10-15 MED ORDER — HYDROXYZINE HCL 50 MG PO TABS: 50.0000 mg | ORAL_TABLET | Freq: Every day | ORAL | 0 refills | Status: DC

## 2019-10-15 NOTE — BHH Suicide Risk Assessment (Signed)
[place] INPATIENT:  Family/Significant Other Suicide Prevention Education  Suicide Prevention Education:  Contact Attempts: with daughter, Barkley Boards [phone]) has been identified by the patient as the family member/significant other with whom the patient will be residing, and identified as the person(s) who will aid the patient in the event of a mental health crisis.  With written consent from the patient, two attempts were made to provide suicide prevention education, prior to and/or following the patient's discharge.  We were unsuccessful in providing suicide prevention education.  A suicide education pamphlet was given to the patient to share with family/significant other.  Date and time of first attempt:10/15/19 /9:30am  Date and time of second attempt:10/15/19 / 11:23am  SLOAN SNAWDER 10/15/2019, 11:24 AM

## 2019-10-15 NOTE — Progress Notes (Signed)
  [facility] Adult Case Management Discharge Plan :  Will you be returning to the same living situation after discharge:  Yes,  patient is returning to her home At discharge, do you have transportation home?: Yes,  patient's daughter is picking her up Do you have the ability to pay for your medications: Yes,  [facility]  Release of information consent forms completed and in the chart;  Patient's signature needed at discharge.  Patient to Follow up at: Follow-up Information    [facility]. Go to.   Why: If you become interested in outpatient medication mangement and therapy services, please go to agency during their walk-in hours. Walk-in hours are Monday- Friday at 8:30 am -5:00 pm. Be sure to provide any discharge paperwork from this hospitalization.  Contact information: [address] [place] [phone]        [facility] Go to.   Why: For SAIOP (substance abuse intensive outpatient) services please go to agency during their walk-in hours. Walk-in hours are Monday-Friday 8:30am-11:30am. Be sure to provide your discharge summary.  Contact information: [address] [place], [place]  Phone: [phone] Fax:[phone]       Services, [facility] Follow up.   Why: A referral has been placed on your behalf. Please call on a DAILY basis and ask to speak to Ms. June in admissions to determine bed availability. Once you have been notified of a bed, please be sure to bring any discharge paperwork.  Contact information: [place] [address] [phone]           Next level of care provider has access to [facility] and Suicide Prevention discussed: Yes,  with the patient     Has patient been referred to the Quitline?: Patient refused referral  Patient has been referred for addiction treatment: Yes  CHANY MONTS, [place] 10/15/2019, 11:22 AM

## 2019-10-15 NOTE — Discharge Summary (Signed)
Physician Discharge Summary Note  Patient:  Caroline Harvey is an 59 y.o., female MRN:  [id number] DOB:  [birth year]  Patient phone:  [phone] (home)   Patient address:   [place] [address],   Total Time spent with patient: Greater than 30 minutes  Date of Admission:  10/11/2019  Date of Discharge: 10/15/19  Reason for Admission: Worsening substance use.  Principal Problem: MDD (major depressive disorder), recurrent severe, without psychosis ([place])  Discharge Diagnoses: Principal Problem:   MDD (major depressive disorder), recurrent severe, without psychosis ([place]) Active Problems:   Substance induced mood disorder ([place])  Past Psychiatric History: Alcoholism, Cocaine/opioid use disorders.                                             Prior [facility] hospitalizations and rehab.  Past Medical History:  Past Medical History:  Diagnosis Date  . Alcohol abuse   . Anemia   . Hypertension   . Hyponatremia 09/2014    Past Surgical History:  Procedure Laterality Date  . ANKLE SURGERY Right    x  2   Family History:  Family History  Problem Relation Age of Onset  . Hypertension Mother   . Breast cancer Paternal Aunt        ? age of onset  . Breast cancer Paternal Aunt        ? age of onset   Family Psychiatric  History: See H&P  Social History:  Social History   Substance and Sexual Activity  Alcohol Use Yes   Comment: Quit May 20 2015     Social History   Substance and Sexual Activity  Drug Use No    Social History   Socioeconomic History  . Marital status: Single    Spouse name: Not on file  . Number of children: Not on file  . Years of education: Not on file  . Highest education level: Not on file  Occupational History  . Not on file  Tobacco Use  . Smoking status: Never Smoker  . Smokeless tobacco: Never Used  Substance and Sexual Activity  . Alcohol use: Yes    Comment: Quit May 20 2015  . Drug use: No  . Sexual activity:  Not on file  Other Topics Concern  . Not on file  Social History Narrative  . Not on file   Social Determinants of Health   Financial Resource Strain:   . Difficulty of Paying Living Expenses:   Food Insecurity:   . Worried About Charity fundraiser in the Last Year:   . Arboriculturist in the Last Year:   Transportation Needs:   . Film/video editor (Medical):   Marland Kitchen Lack of Transportation (Non-Medical):   Physical Activity:   . Days of Exercise per Week:   . Minutes of Exercise per Session:   Stress:   . Feeling of Stress :   Social Connections:   . Frequency of Communication with Friends and Family:   . Frequency of Social Gatherings with Friends and Family:   . Attends Religious Services:   . Active Member of Clubs or Organizations:   . Attends Archivist Meetings:   Marland Kitchen Marital Status:    Hospital Course: (Per Md's admission assessment): Patient is a 59 year old female with a past psychiatric history significant for opiate dependence,  cocaine dependence, alcohol dependence who presented to the behavioral health hospital requesting assistance with her ongoing substance abuse issues. She stated that she was drinking between 4 to 6-4 ounce beers a day. She also admitted to using heroin the day prior to admission. She also admitted to cocaine usage. Her last psychiatric admission was in March 2021. It was for similar complaints at that time. She was discharged with the diagnosis of substance-induced mood disorder. She stated she had no days of sobriety after she had been discharged from the hospital. Her drug screen on admission showed a blood alcohol that was less than 10, there were no opiates in her system, and there was cocaine present. The decision was made to admit her to the hospital for evaluation and stabilization.  This is one of the second psychiatric admission/discharge summaries from the [place] for this 59 year old African-American female.  She is with hx of chronic mental illness, substance use disorders & multiple psychiatric admissions. She was admitted to the [facility], treated & was discharged in March of this year to follow-up care on an outpatient basis. Both the admissions were due to worsening substance abuse issues.  She was  admitted to the hospital this time around for evaluation & treatment for her ongoing substance abuse issues.  After evaluation of her presenting symptoms, Irielle was recommended for mood stabilization treatments. The medication regimen for her presenting symptoms were discussed & with her consent initiated. She received, stabilized & was discharged on the medications as listed below on her discharge medication lists. She was also enrolled & participated in the group counseling sessions being offered & held on this unit. She learned coping skills. She presented on this admission, other chronic medical conditions that required treatment & monitoring. She was resumed, treated & discharged on all her pertinent home medications for those pre-existing health issues. She tolerated her treatment regimen without any adverse effects or reactions reported.  During the course of her hospitalization, the 15-minute checks were adequate to ensure Ilene's safety.  Patient did not display any dangerous, violent or suicidal behavior on the unit. She interacted with patients & staff appropriately, participated appropriately in the group sessions/therapies. Her medications were addressed & adjusted to meet her needs. She was recommended for outpatient follow-up care & medication management upon discharge to assure her continuity of care.  At the time of discharge, patient is not reporting any acute suicidal/homicidal ideations. She feels more confident about her self-care & in managing her symptoms. She currently denies any new issues or concerns. Education and supportive counseling provided throughout her hospital stay & upon  discharge.  Today upon her discharge evaluation with the attending psychiatrist, Nakeshia shares she is doing well. She denies any other specific concerns. She is sleeping well. Her appetite is good. She denies other physical complaints. She denies AH/VH. She feels that her medications have been helpful & is in agreement to continue her current treatment regimen as recommended. She was able to engage in safety planning including plan to return to [facility] or contact emergency services if she feels unable to maintain her own safety or the safety of others. Pt had no further questions, comments, or concerns. She left [facility] with all personal belongings in no apparent distress. Transportation per family (daughter).   Physical Findings: AIMS: Facial and Oral Movements Muscles of Facial Expression: None, normal Lips and Perioral Area: None, normal Jaw: None, normal Tongue: None, normal,Extremity Movements Upper (arms, wrists, hands, fingers): None, normal Lower (  legs, knees, ankles, toes): None, normal, Trunk Movements Neck, shoulders, hips: None, normal, Overall Severity Severity of abnormal movements (highest score from questions above): None, normal Incapacitation due to abnormal movements: None, normal Patient's awareness of abnormal movements (rate only patient's report): No Awareness, Dental Status Current problems with teeth and/or dentures?: No Does patient usually wear dentures?: No  CIWA:  CIWA-Ar Total: 8 COWS:  COWS Total Score: 1  Musculoskeletal: Strength & Muscle Tone: within normal limits Gait & Station: normal Patient leans: N/A  Psychiatric Specialty Exam: Physical Exam  Nursing note and vitals reviewed. Constitutional: She is oriented to person, place, and time. She appears well-developed and well-nourished.  HENT:  Head: Normocephalic.  Eyes: Pupils are equal, round, and reactive to light.  Cardiovascular: Normal rate.  Respiratory: Effort normal.  Genitourinary:     Genitourinary Comments: Deferred   Musculoskeletal:        General: Normal range of motion.     Cervical back: Normal range of motion.  Neurological: She is alert and oriented to person, place, and time.  Skin: Skin is warm and dry.    Review of Systems  Constitutional: Negative.  Negative for chills, diaphoresis and fever.  HENT: Negative for congestion, rhinorrhea, sneezing and sore throat.   Eyes: Negative for discharge.  Respiratory: Negative for cough, chest tightness, shortness of breath and wheezing.   Cardiovascular: Negative for chest pain and palpitations.  Gastrointestinal: Negative for diarrhea, nausea and vomiting.  Endocrine: Negative for cold intolerance.  Genitourinary: Negative for difficulty urinating.  Musculoskeletal: Negative.   Skin: Negative.   Allergic/Immunologic: Negative for environmental allergies and food allergies.       Allergies: NKDA  Neurological: Negative for dizziness, tremors, seizures, syncope, weakness, light-headedness and headaches.  Psychiatric/Behavioral: Negative for agitation, behavioral problems, confusion, decreased concentration, dysphoric mood, hallucinations, self-injury, sleep disturbance and suicidal ideas. The patient is not nervous/anxious and is not hyperactive.     Blood pressure (!) 152/91, pulse 80, temperature 97.7 F (36.5 C), temperature source Oral, resp. rate 16, height 5\' 9"  (1.753 m), weight 97.1 kg, last menstrual period 12/14/2010, SpO2 99 %.Body mass index is 31.6 kg/m.  See MD's discharge SRA   Has this patient used any form of tobacco in the last 30 days? (Cigarettes, Smokeless Tobacco, Cigars, and/or Pipes)  No  Blood Alcohol level:  Lab Results  Component Value Date   ETH <10 10/11/2019   ETH <10 [id number]   Metabolic Disorder Labs:  Lab Results  Component Value Date   HGBA1C 5.0 07/29/2019   MPG 96.8 07/29/2019   No results found for: PROLACTIN Lab Results  Component Value Date   CHOL 161  07/29/2019   TRIG 31 07/29/2019   HDL 96 07/29/2019   CHOLHDL 1.7 07/29/2019   VLDL 6 07/29/2019   LDLCALC 59 07/29/2019   LDLCALC 31 01/18/2008   See Psychiatric Specialty Exam and Suicide Risk Assessment completed by Attending Physician prior to discharge.  Discharge destination:  Home  Is patient on multiple antipsychotic therapies at discharge:  No   Has Patient had three or more failed trials of antipsychotic monotherapy by history:  No  Recommended Plan for Multiple Antipsychotic Therapies: NA  Allergies as of 10/15/2019   No Known Allergies     Medication List    TAKE these medications     Indication  amLODipine 5 MG tablet Commonly known as: NORVASC Take 1 tablet (5 mg total) by mouth daily.  Indication: High Blood Pressure Disorder   hydrOXYzine  25 MG tablet Commonly known as: ATARAX/VISTARIL Take 1 tablet (25 mg total) by mouth every 6 (six) hours as needed for anxiety.  Indication: Feeling Anxious   hydrOXYzine 50 MG tablet Commonly known as: ATARAX/VISTARIL Take 1 tablet (50 mg total) by mouth at bedtime. For sleep  Indication: Feeling Anxious, Insomnia   traZODone 100 MG tablet Commonly known as: DESYREL Take 1 tablet (100 mg total) by mouth at bedtime. For sleep What changed:   medication strength  how much to take  when to take this  reasons to take this  additional instructions  Indication: [facility]. Go to.   Why: If you become interested in outpatient medication mangement and therapy services, please go to agency during their walk-in hours. Walk-in hours are Monday- Friday at 8:30 am -5:00 pm. Be sure to provide any discharge paperwork from this hospitalization.  Contact information: [address] [place] [address] [phone]        [facility]. Go to.   Why: For SAIOP (substance abuse intensive outpatient) services please go to agency during their walk-in  hours. Walk-in hours are Monday-Friday 8:30am-11:30am. Be sure to provide your discharge summary.  Contact information: [address] [place], [place]  Phone: [phone] [phone]       Services, [facility] Follow up.   Why: A referral has been placed on your behalf. Please call on a DAILY basis and ask to speak to Ms. June in admissions to determine bed availability. Once you have been notified of a bed, please be sure to bring any discharge paperwork.  Contact information: [place] [address] [phone]          Follow-up recommendations: Activity:  As tolerated Diet: As recommended by your primary care doctor. Keep all scheduled follow-up appointments as recommended.  Comments: Prescriptions given at discharge.  Patient agreeable to plan.  Given opportunity to ask questions.  Appears to feel comfortable with discharge denies any current suicidal or homicidal thought. Patient is also instructed prior to discharge to: Take all medications as prescribed by his/her mental healthcare provider. Report any adverse effects and or reactions from the medicines to his/her outpatient provider promptly. Patient has been instructed & cautioned: To not engage in alcohol and or illegal drug use while on prescription medicines. In the event of worsening symptoms, patient is instructed to call the crisis hotline, 911 and or go to the nearest ED for appropriate evaluation and treatment of symptoms. To follow-up with his/her primary care provider for your other medical issues, concerns and or health care needs.  Signed: Lindell Spar, NP, PMHNP, FNP-BC 10/15/2019, 12:41 PM

## 2019-10-15 NOTE — Progress Notes (Signed)
Recreation Therapy Notes  Date:  5.21.21 Time: 0930 Location: 300 Hall Group Room  Group Topic: Stress Management  Goal Area(s) Addresses:  Patient will identify positive stress management techniques. Patient will identify benefits of using stress management post d/c.  Behavioral Response:  Engaged  Intervention: Stress Management  Activity:  Meditation.  LRT played a meditation that focused on being resilient in the face of adversity.  Patients were to listen and follow along as meditation played to participate.    Education:  Stress Management, Discharge Planning.   Education Outcome: Acknowledges Education  Clinical Observations/Feedback: Pt attended and participated in activity.     Victorino Sparrow, LRT/CTRS         Victorino Sparrow A 10/15/2019 11:31 AM

## 2019-10-15 NOTE — BHH Suicide Risk Assessment (Signed)
[facility] Discharge Suicide Risk Assessment   Principal Problem: MDD (major depressive disorder), recurrent severe, without psychosis ([place]) Discharge Diagnoses: Principal Problem:   MDD (major depressive disorder), recurrent severe, without psychosis ([place]) Active Problems:   Substance induced mood disorder ([place])   Total Time spent with patient: 15 minutes  Musculoskeletal: Strength & Muscle Tone: within normal limits Gait & Station: shuffle Patient leans: N/A  Psychiatric Specialty Exam: Review of Systems  All other systems reviewed and are negative.   Blood pressure (!) 152/91, pulse 80, temperature 97.7 F (36.5 C), temperature source Oral, resp. rate 16, height 5\' 9"  (1.753 m), weight 97.1 kg, last menstrual period 12/14/2010, SpO2 99 %.Body mass index is 31.6 kg/m.  General Appearance: Casual  Eye Contact::  Good  Speech:  Normal Rate409  Volume:  Normal  Mood:  Euthymic  Affect:  Congruent  Thought Process:  Coherent and Descriptions of Associations: Intact  Orientation:  Full (Time, Place, and Person)  Thought Content:  Logical  Suicidal Thoughts:  No  Homicidal Thoughts:  No  Memory:  Immediate;   Fair Recent;   Fair Remote;   Fair  Judgement:  Intact  Insight:  Fair  Psychomotor Activity:  Normal  Concentration:  Good  Recall:  Good  Fund of Knowledge:Good  Language: Good  Akathisia:  Negative  Handed:  Right  AIMS (if indicated):     Assets:  Desire for Improvement Resilience  Sleep:  Number of Hours: 5.25  Cognition: WNL  ADL's:  Intact   Mental Status Per Nursing Assessment::   On Admission:  NA  Demographic Factors:  Divorced or widowed, Low socioeconomic status and Unemployed  Loss Factors: Financial problems/change in socioeconomic status  Historical Factors: Impulsivity  Risk Reduction Factors:   Living with another person, especially a relative and Positive coping skills or problem solving skills  Continued Clinical Symptoms:   Depression:   Comorbid alcohol abuse/dependence Impulsivity Alcohol/Substance Abuse/Dependencies  Cognitive Features That Contribute To Risk:  None    Suicide Risk:  Minimal: No identifiable suicidal ideation.  Patients presenting with no risk factors but with morbid ruminations; may be classified as minimal risk based on the severity of the depressive symptoms  Follow-up Information    [facility]. Go to.   Why: Please go to this provider for services during their walk in hours:  Monday through Friday, 8:30 am to 5:00 pm. Contact information: [address] [place] [address] [phone]        [facility], [facility]. Go to.   Specialty: Behavioral Health Why: NEW ADDRESS:  [address] [place], [place] [address] :  Please go to this provider during their walk in hours:  Monday through Friday, 7:00 am to 10:30 am. Contact information: [place]  [address] [phone]           Plan Of Care/Follow-up recommendations:  Activity:  ad lib  Sharma Covert, MD 10/15/2019, 9:27 AM

## 2019-10-15 NOTE — Progress Notes (Signed)
RN met with pt and reviewed pt's discharge instructions.  Pt verbalized understanding of discharge instructions and pt did not have any questions. RN reviewed and provided copy of SRA, Transition Record and AVS.  RN returned pt's belongings to pt.  Prescriptions were given to pt.  Pt denied SI/HI/AVH and voiced no concerns.  Pt was appreciative of the care pt received at [facility].  Patient discharged to the lobby without incident.

## 2019-10-15 NOTE — BHH Suicide Risk Assessment (Signed)
[place] INPATIENT:  Family/Significant Other Suicide Prevention Education  Suicide Prevention Education:    SPE completed with patient, as attempts to contact patient's collateral contacts were unsuccessful. SPI pamphlet provided to pt and pt was encouraged to share information with support network, ask questions, and talk about any concerns relating to SPE. Patient denies access to guns/firearms and verbalized understanding of information provided. Mobile Crisis information also provided to patient.   Radonna Ricker, MSW, LCSW Clinical Social Worker [facility]  Phone: [phone]

## 2019-12-07 ENCOUNTER — Telehealth (HOSPITAL_COMMUNITY): Payer: Self-pay | Admitting: Licensed Clinical Social Worker

## 2019-12-07 ENCOUNTER — Other Ambulatory Visit: Payer: Self-pay

## 2019-12-07 ENCOUNTER — Ambulatory Visit (HOSPITAL_COMMUNITY): Payer: 59 | Admitting: Licensed Clinical Social Worker

## 2019-12-07 NOTE — Telephone Encounter (Signed)
Attempted contact with client and daughter whose contact information was provided by discharging providers. Unable to reach either nor leave voicemails. Client may reschedule wen available.

## 2019-12-15 ENCOUNTER — Encounter (HOSPITAL_COMMUNITY): Payer: Self-pay

## 2019-12-15 ENCOUNTER — Other Ambulatory Visit: Payer: Self-pay

## 2019-12-15 ENCOUNTER — Ambulatory Visit (HOSPITAL_COMMUNITY)
Admission: EM | Admit: 2019-12-15 | Discharge: 2019-12-15 | Disposition: A | Discharge status: Home / Self Care | Payer: 59 | Attending: Family Medicine | Admitting: Family Medicine

## 2019-12-15 DIAGNOSIS — M25511 Pain in right shoulder: Secondary | ICD-10-CM

## 2019-12-15 DIAGNOSIS — G8929 Other chronic pain: Secondary | ICD-10-CM

## 2019-12-15 MED ORDER — GABAPENTIN 300 MG PO CAPS: 300.0000 mg | ORAL_CAPSULE | Freq: Three times a day (TID) | ORAL | 0 refills | Status: DC

## 2019-12-15 MED ORDER — PREDNISONE 10 MG (21) PO TBPK
ORAL_TABLET | Freq: Every day | ORAL | 0 refills | Status: DC
Start: 2019-12-15 — End: 2020-02-14

## 2019-12-15 NOTE — ED Triage Notes (Signed)
Pt states she has had right shoulder pain for years, but for the last 3-4 mos she has had decreased ROM in shoulder. Pt states she is unable to reach for things at a higher level with shoulder.

## 2019-12-15 NOTE — Discharge Instructions (Signed)
Continue with your robaxin as prescribed.  I am hopeful the prednisone pack helps some with your discomfort.  I would like you to follow up with orthopedics or sports medicine for further evaluation and recommendations for treatment.  I have provided a 30 day refill of your gabapentin, to be managed long term with your primary care provider.  See exercises provided to try to loosen shoulder and help with range of motion.

## 2019-12-15 NOTE — ED Provider Notes (Signed)
[place]    CSN: [id number] Arrival date & time: 12/15/19  1217      History   Chief Complaint Chief Complaint  Patient presents with  . Shoulder Pain    HPI Caroline Harvey is a 59 y.o. female.   Caroline Harvey presents with complaints of right shoulder pain. Has been ongoing for years now, she states she "didn't want surgery."  She is uncertain what surgery was recommended to her. No specific injury to the shoulder. She is right handed. Reaching out or raising the shoulder causes it to "catch" and she has to "adjust it" in order to complete the motion. No numbness or tingling to hand. She is unable to lay on the shoulder to sleep. She takes robaxin at night to help her sleep. No additional medications for pain for this. Out of gabapentin which she also takes. Doesn't follow regularly with an orthopedist. She is looking for a new PCP as her current PCP office is too far from her.    ROS per HPI, negative if not otherwise mentioned.      Past Medical History:  Diagnosis Date  . Alcohol abuse   . Anemia   . Hypertension   . Hyponatremia 09/2014    Patient Active Problem List   Diagnosis Date Noted  . MDD (major depressive disorder), recurrent severe, without psychosis ([place]) 10/11/2019  . Mood disorder, drug-induced ([place]) 07/29/2019  . Substance induced mood disorder ([place]) 07/29/2019  . Respiratory failure ([place]) 01/02/2017  . SOB (shortness of breath) 01/01/2017  . Asthma exacerbation 01/28/2016  . Bronchiolitis 01/27/2016  . Hyponatremia 10/03/2014  . Nausea vomiting and diarrhea 10/03/2014  . Leukopenia 10/03/2014  . Acute kidney injury ([place]) 10/03/2014  . 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 FRACTURE, ANKLE, RIGHT 07/05/2009  . GERD 02/07/2009  . BACK PAIN, LUMBAR 02/24/2008  . GOUT, UNSPECIFIED 11/25/2007  . Alcohol abuse 11/25/2007  . SUPERFICIAL THROMBOPHLEBITIS 03/18/2007  . METATARSALGIA 03/18/2007  . VARICOSE VEIN 02/18/2007  . Pain in limb  02/18/2007  . B12 DEFICIENCY 01/12/2007  . OBESITY NOS 01/12/2007  . HYPERTENSION, BENIGN 01/12/2007  . EDEMA LEG 01/12/2007    Past Surgical History:  Procedure Laterality Date  . ANKLE SURGERY Right    x  2    OB History    Gravida  1   Para  1   Term  1   Preterm      AB      Living  1     SAB      TAB      Ectopic      Multiple      Live Births               Home Medications    Prior to Admission medications   Medication Sig Start Date End Date Taking? Authorizing Provider  albuterol (VENTOLIN HFA) 108 (90 Base) MCG/ACT inhaler Inhale into the lungs. 11/29/19   [provider]  amLODipine (NORVASC) 5 MG tablet Take 1 tablet (5 mg total) by mouth daily. 08/03/19   Connye Burkitt, NP  gabapentin (NEURONTIN) 300 MG capsule Take 1 capsule (300 mg total) by mouth 3 (three) times daily. 12/15/19 01/14/20  Zigmund Gottron, NP  hydrOXYzine (ATARAX/VISTARIL) 25 MG tablet Take 1 tablet (25 mg total) by mouth every 6 (six) hours as needed for anxiety. 10/15/19   Lindell Spar I, NP  hydrOXYzine (ATARAX/VISTARIL) 50 MG tablet Take 1 tablet (50 mg total) by mouth at bedtime.  For sleep 10/15/19   Lindell Spar I, NP  lisinopril (ZESTRIL) 20 MG tablet Take 20 mg by mouth daily. 11/23/19   [provider]  methocarbamol (ROBAXIN) 750 MG tablet Take 750 mg by mouth. 11/04/19   [provider]  predniSONE (STERAPRED UNI-PAK 21 TAB) 10 MG (21) TBPK tablet Take by mouth daily. Per box instruction 12/15/19   Zigmund Gottron, NP  traZODone (DESYREL) 100 MG tablet Take 1 tablet (100 mg total) by mouth at bedtime. For sleep 10/15/19   Encarnacion Slates, NP    Family History Family History  Problem Relation Age of Onset  . Hypertension Mother   . Breast cancer Paternal Aunt        ? age of onset  . Breast cancer Paternal Aunt        ? age of onset    Social History Social History   Tobacco Use  . Smoking status: Never Smoker  . Smokeless tobacco: Never  Used  Substance Use Topics  . Alcohol use: Yes    Comment: Quit May 20 2015  . Drug use: No     Allergies   Patient has no known allergies.   Review of Systems Review of Systems   Physical Exam Triage Vital Signs ED Triage Vitals [12/15/19 1235]  Enc Vitals Group     BP (!) 145/75     Pulse Rate 65     Resp 16     Temp 98.7 F (37.1 C)     Temp Source Oral     SpO2 100 %     Weight 220 lb (99.8 kg)     Height 5\' 9"  (1.753 m)     Head Circumference      Peak Flow      Pain Score 8     Pain Loc      Pain Edu?      Excl. in [place]?    No data found.  Updated Vital Signs BP (!) 145/75   Pulse 65   Temp 98.7 F (37.1 C) (Oral)   Resp 16   Ht 5\' 9"  (1.753 m)   Wt 220 lb (99.8 kg)   LMP 12/14/2010   SpO2 100%   BMI 32.49 kg/m   Visual Acuity Right Eye Distance:   Left Eye Distance:   Bilateral Distance:    Right Eye Near:   Left Eye Near:    Bilateral Near:     Physical Exam Constitutional:      General: She is not in acute distress.    Appearance: She is well-developed.  Cardiovascular:     Rate and Rhythm: Normal rate.  Pulmonary:     Effort: Pulmonary effort is normal.  Musculoskeletal:     Right shoulder: No swelling, deformity, effusion, laceration, tenderness, bony tenderness or crepitus. Decreased range of motion. Normal strength. Normal pulse.     Comments: Pain to proximal humerus, primarily with flexion such as reaching outward and with arc; hesitation and patient manipulation of shoulder in order to raise arm up above shoulder;  pain with external rotation but strength equal bilaterally   Skin:    General: Skin is warm and dry.  Neurological:     Mental Status: She is alert and oriented to person, place, and time.      UC Treatments / Results  Labs (all labs ordered are listed, but only abnormal results are displayed) Labs Reviewed - No data to display  EKG   Radiology No  results found.  Procedures Procedures (including  critical care time)  Medications Ordered in UC Medications - No data to display  Initial Impression / Assessment and Plan / UC Course  I have reviewed the triage vital signs and the nursing notes.  Pertinent labs & imaging results that were available during my care of the patient were reviewed by me and considered in my medical decision making (see chart for details).     Chronic right shoulder pain, no new injury. Pain management and range of motion discussed, encouraged follow up with sports medicine/ orthopedics for additional evaluation and input into long term management. Patient verbalized understanding and agreeable to plan.   Final Clinical Impressions(s) / UC Diagnoses   Final diagnoses:  Chronic right shoulder pain     Discharge Instructions     Continue with your robaxin as prescribed.  I am hopeful the prednisone pack helps some with your discomfort.  I would like you to follow up with orthopedics or sports medicine for further evaluation and recommendations for treatment.  I have provided a 30 day refill of your gabapentin, to be managed long term with your primary care provider.  See exercises provided to try to loosen shoulder and help with range of motion.    ED Prescriptions    Medication Sig Dispense Auth. Provider   gabapentin (NEURONTIN) 300 MG capsule Take 1 capsule (300 mg total) by mouth 3 (three) times daily. 90 capsule Duana Benedict B, NP   predniSONE (STERAPRED UNI-PAK 21 TAB) 10 MG (21) TBPK tablet Take by mouth daily. Per box instruction 21 tablet Zigmund Gottron, NP     PDMP not reviewed this encounter.   Zigmund Gottron, NP 12/15/19 1326

## 2020-01-17 ENCOUNTER — Encounter (HOSPITAL_COMMUNITY): Payer: Self-pay

## 2020-01-17 ENCOUNTER — Other Ambulatory Visit: Payer: Self-pay

## 2020-01-17 ENCOUNTER — Ambulatory Visit (HOSPITAL_COMMUNITY)
Admission: EM | Admit: 2020-01-17 | Discharge: 2020-01-17 | Disposition: A | Discharge status: Home / Self Care | Payer: BLUE CROSS/BLUE SHIELD | Attending: Physician Assistant | Admitting: Physician Assistant

## 2020-01-17 DIAGNOSIS — G8929 Other chronic pain: Secondary | ICD-10-CM

## 2020-01-17 DIAGNOSIS — M25511 Pain in right shoulder: Secondary | ICD-10-CM

## 2020-01-17 MED ORDER — LISINOPRIL 20 MG PO TABS: 20.0000 mg | ORAL_TABLET | Freq: Every day | ORAL | 1 refills | Status: AC

## 2020-01-17 MED ORDER — GABAPENTIN 300 MG PO CAPS: 300.0000 mg | ORAL_CAPSULE | Freq: Three times a day (TID) | ORAL | 0 refills | Status: DC

## 2020-01-17 MED ORDER — DICLOFENAC SODIUM 1 % EX GEL: 4.0000 g | Freq: Four times a day (QID) | CUTANEOUS | 0 refills | Status: DC

## 2020-01-17 NOTE — ED Triage Notes (Signed)
Pt c/o chronic right shoulder pain, "for years" denies recent injury

## 2020-01-17 NOTE — ED Provider Notes (Signed)
[place]    CSN: [id number] Arrival date & time: 01/17/20  1150      History   Chief Complaint Chief Complaint  Patient presents with  . Shoulder Pain    HPI Caroline Harvey is a 59 y.o. female.   Patient reports for right shoulder pain. This is an ongoing issue and was seen for same on July 2021. She reports pain has continued without much improvement. She continues to endorse pain with raising the shoulder and feeling weak when trying to do so. Pain is in the back of shoulder. Reports it is more comfortable when supported. Some pain medicines help slightly, such as tylenol. She reports she has not followed up with specialists but has a primary care appointment in October. She denies new injury. No numbness, tingling. She also requests refill of lisinopril and gabapentin. She has been on stable doses for a long time. Denies shortness of breath, headaches, blurry vision, chest pains.      Past Medical History:  Diagnosis Date  . Alcohol abuse   . Anemia   . Hypertension   . Hyponatremia 09/2014    Patient Active Problem List   Diagnosis Date Noted  . MDD (major depressive disorder), recurrent severe, without psychosis ([place]) 10/11/2019  . Mood disorder, drug-induced ([place]) 07/29/2019  . Substance induced mood disorder ([place]) 07/29/2019  . Respiratory failure ([place]) 01/02/2017  . SOB (shortness of breath) 01/01/2017  . Asthma exacerbation 01/28/2016  . Bronchiolitis 01/27/2016  . Hyponatremia 10/03/2014  . Nausea vomiting and diarrhea 10/03/2014  . Leukopenia 10/03/2014  . Acute kidney injury ([place]) 10/03/2014  . 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 FRACTURE, ANKLE, RIGHT 07/05/2009  . GERD 02/07/2009  . BACK PAIN, LUMBAR 02/24/2008  . GOUT, UNSPECIFIED 11/25/2007  . Alcohol abuse 11/25/2007  . SUPERFICIAL THROMBOPHLEBITIS 03/18/2007  . METATARSALGIA 03/18/2007  . VARICOSE VEIN 02/18/2007  . Pain in limb 02/18/2007  . B12 DEFICIENCY 01/12/2007  . OBESITY NOS  01/12/2007  . HYPERTENSION, BENIGN 01/12/2007  . EDEMA LEG 01/12/2007    Past Surgical History:  Procedure Laterality Date  . ANKLE SURGERY Right    x  2    OB History    Gravida  1   Para  1   Term  1   Preterm      AB      Living  1     SAB      TAB      Ectopic      Multiple      Live Births               Home Medications    Prior to Admission medications   Medication Sig Start Date End Date Taking? Authorizing Provider  albuterol (VENTOLIN HFA) 108 (90 Base) MCG/ACT inhaler Inhale into the lungs. 11/29/19   [provider]  amLODipine (NORVASC) 5 MG tablet Take 1 tablet (5 mg total) by mouth daily. 08/03/19   Connye Burkitt, NP  diclofenac Sodium (VOLTAREN) 1 % GEL Apply 4 g topically 4 (four) times daily. 01/17/20   Lovell Nuttall, Marguerita Beards, PA-C  gabapentin (NEURONTIN) 300 MG capsule Take 1 capsule (300 mg total) by mouth 3 (three) times daily. 01/17/20 02/16/20  Nakyla Bracco, Marguerita Beards, PA-C  hydrOXYzine (ATARAX/VISTARIL) 25 MG tablet Take 1 tablet (25 mg total) by mouth every 6 (six) hours as needed for anxiety. 10/15/19   Lindell Spar I, NP  hydrOXYzine (ATARAX/VISTARIL) 50 MG tablet Take 1 tablet (50 mg total) by  mouth at bedtime. For sleep 10/15/19   Lindell Spar I, NP  lisinopril (ZESTRIL) 20 MG tablet Take 1 tablet (20 mg total) by mouth daily. 01/17/20   Evone Arseneau, Marguerita Beards, PA-C  methocarbamol (ROBAXIN) 750 MG tablet Take 750 mg by mouth. 11/04/19   [provider]  predniSONE (STERAPRED UNI-PAK 21 TAB) 10 MG (21) TBPK tablet Take by mouth daily. Per box instruction 12/15/19   Zigmund Gottron, NP  traZODone (DESYREL) 100 MG tablet Take 1 tablet (100 mg total) by mouth at bedtime. For sleep 10/15/19   Encarnacion Slates, NP    Family History Family History  Problem Relation Age of Onset  . Hypertension Mother   . Breast cancer Paternal Aunt        ? age of onset  . Breast cancer Paternal Aunt        ? age of onset    Social History Social History   Tobacco  Use  . Smoking status: Never Smoker  . Smokeless tobacco: Never Used  Substance Use Topics  . Alcohol use: Yes    Comment: Quit May 20 2015  . Drug use: No     Allergies   Patient has no known allergies.   Review of Systems Review of Systems   Physical Exam Triage Vital Signs ED Triage Vitals  Enc Vitals Group     BP 01/17/20 1301 (!) 188/88     Pulse Rate 01/17/20 1301 67     Resp 01/17/20 1301 18     Temp 01/17/20 1301 98.6 F (37 C)     Temp src --      SpO2 01/17/20 1301 98 %     Weight --      Height --      Head Circumference --      Peak Flow --      Pain Score 01/17/20 1300 8     Pain Loc --      Pain Edu? --      Excl. in [place]? --    No data found.  Updated Vital Signs BP (!) 188/88 Comment: has been out of lisinopril x 2 months  Pulse 67   Temp 98.6 F (37 C)   Resp 18   LMP 12/14/2010   SpO2 98%   Visual Acuity Right Eye Distance:   Left Eye Distance:   Bilateral Distance:    Right Eye Near:   Left Eye Near:    Bilateral Near:     Physical Exam Vitals and nursing note reviewed.  Constitutional:      General: She is not in acute distress.    Appearance: She is well-developed.  HENT:     Head: Normocephalic and atraumatic.  Eyes:     Conjunctiva/sclera: Conjunctivae normal.  Cardiovascular:     Rate and Rhythm: Normal rate and regular rhythm.     Heart sounds: No murmur heard.   Pulmonary:     Effort: Pulmonary effort is normal. No respiratory distress.     Breath sounds: Normal breath sounds.  Abdominal:     Palpations: Abdomen is soft.     Tenderness: There is no abdominal tenderness.  Musculoskeletal:     Cervical back: Neck supple.     Comments: Right shoulder: no deformity, swelling or ecchymosis. No boney TTP. Some TTP of posterior shoulder. Pain with resisted external rotation. Pain with slow descent from parallel. Limited AROM, full Passive ROM. No TTP of neck or paraspinals. 5/5 strength at tricep  and bicep. Speeds  negative. Empty can mildly positive.  Skin:    General: Skin is warm and dry.  Neurological:     Mental Status: She is alert.      UC Treatments / Results  Labs (all labs ordered are listed, but only abnormal results are displayed) Labs Reviewed - No data to display  EKG   Radiology No results found.  Procedures Procedures (including critical care time)  Medications Ordered in UC Medications - No data to display  Initial Impression / Assessment and Plan / UC Course  I have reviewed the triage vital signs and the nursing notes.  Pertinent labs & imaging results that were available during my care of the patient were reviewed by me and considered in my medical decision making (see chart for details).     #Chronic Right shoulder pain Patient is a 59 year old with chronic right shoulder pain. Most consistent with rotator cuff pathology. Sling for comfort. Voltaren topical and continued tylenol. Given minimal response to prednisone in past, will defer. Encouraged sports medicine follow up. Encourage sooner follow up with a primary. Refill lisinopril and gabapentin but stated she needs PCP for further management. Patient verbalized understanding plan of care.  Final Clinical Impressions(s) / UC Diagnoses   Final diagnoses:  Chronic right shoulder pain     Discharge Instructions     Use the gel as prescribed  Resume medications as prescribed  Call Internal medicine center for first available primary care follow up  Call sports medicine for follow up on shoulder      ED Prescriptions    Medication Sig Dispense Auth. Provider   lisinopril (ZESTRIL) 20 MG tablet Take 1 tablet (20 mg total) by mouth daily. 30 tablet Alainah Phang, Marguerita Beards, PA-C   gabapentin (NEURONTIN) 300 MG capsule Take 1 capsule (300 mg total) by mouth 3 (three) times daily. 90 capsule Boni Maclellan, Marguerita Beards, PA-C   diclofenac Sodium (VOLTAREN) 1 % GEL Apply 4 g topically 4 (four) times daily. 150 g Sultana Tierney, Marguerita Beards,  PA-C     PDMP not reviewed this encounter.   Purnell Shoemaker, PA-C 01/17/20 2349

## 2020-01-17 NOTE — Discharge Instructions (Signed)
Use the gel as prescribed  Resume medications as prescribed  Call Internal medicine center for first available primary care follow up  Call sports medicine for follow up on shoulder

## 2020-01-24 ENCOUNTER — Ambulatory Visit: Payer: BLUE CROSS/BLUE SHIELD | Admitting: Family Medicine

## 2020-02-14 ENCOUNTER — Other Ambulatory Visit: Payer: Self-pay

## 2020-02-14 ENCOUNTER — Encounter (HOSPITAL_COMMUNITY): Payer: Self-pay

## 2020-02-14 ENCOUNTER — Emergency Department (HOSPITAL_COMMUNITY): Payer: BLUE CROSS/BLUE SHIELD

## 2020-02-14 ENCOUNTER — Emergency Department (HOSPITAL_COMMUNITY)
Admission: EM | Admit: 2020-02-14 | Discharge: 2020-02-14 | Disposition: A | Discharge status: Home / Self Care | Payer: BLUE CROSS/BLUE SHIELD | Attending: Emergency Medicine | Admitting: Emergency Medicine

## 2020-02-14 DIAGNOSIS — Z20822 Contact with and (suspected) exposure to covid-19: Secondary | ICD-10-CM | POA: Diagnosis not present

## 2020-02-14 DIAGNOSIS — J4521 Mild intermittent asthma with (acute) exacerbation: Secondary | ICD-10-CM | POA: Insufficient documentation

## 2020-02-14 DIAGNOSIS — Z79899 Other long term (current) drug therapy: Secondary | ICD-10-CM | POA: Diagnosis not present

## 2020-02-14 DIAGNOSIS — J4541 Moderate persistent asthma with (acute) exacerbation: Secondary | ICD-10-CM

## 2020-02-14 DIAGNOSIS — I10 Essential (primary) hypertension: Secondary | ICD-10-CM | POA: Diagnosis not present

## 2020-02-14 DIAGNOSIS — R0602 Shortness of breath: Secondary | ICD-10-CM | POA: Diagnosis present

## 2020-02-14 HISTORY — DX: Unspecified asthma, uncomplicated: J45.909

## 2020-02-14 LAB — CBC WITH DIFFERENTIAL/PLATELET
Abs Immature Granulocytes: 0.01 10*3/uL (ref 0.00–0.07)
Basophils Absolute: 0.1 10*3/uL (ref 0.0–0.1)
Basophils Relative: 1 %
Eosinophils Absolute: 1.2 10*3/uL — ABNORMAL HIGH (ref 0.0–0.5)
Eosinophils Relative: 17 %
HCT: 39.3 % (ref 36.0–46.0)
Hemoglobin: 12.7 g/dL (ref 12.0–15.0)
Immature Granulocytes: 0 %
Lymphocytes Relative: 24 %
Lymphs Abs: 1.6 10*3/uL (ref 0.7–4.0)
MCH: 30.3 pg (ref 26.0–34.0)
MCHC: 32.3 g/dL (ref 30.0–36.0)
MCV: 93.8 fL (ref 80.0–100.0)
Monocytes Absolute: 0.5 10*3/uL (ref 0.1–1.0)
Monocytes Relative: 7 %
Neutro Abs: 3.6 10*3/uL (ref 1.7–7.7)
Neutrophils Relative %: 51 %
Platelets: 243 10*3/uL (ref 150–400)
RBC: 4.19 MIL/uL (ref 3.87–5.11)
RDW: 13.2 % (ref 11.5–15.5)
WBC: 6.9 10*3/uL (ref 4.0–10.5)
nRBC: 0 % (ref 0.0–0.2)

## 2020-02-14 LAB — BASIC METABOLIC PANEL
Anion gap: 11 (ref 5–15)
BUN: 13 mg/dL (ref 6–20)
CO2: 24 mmol/L (ref 22–32)
Calcium: 9.5 mg/dL (ref 8.9–10.3)
Chloride: 101 mmol/L (ref 98–111)
Creatinine, Ser: 0.69 mg/dL (ref 0.44–1.00)
GFR calc Af Amer: >60 mL/min (ref >60)
GFR calc non Af Amer: >60 mL/min (ref >60)
Glucose, Bld: 96 mg/dL (ref 70–99)
Potassium: 4.1 mmol/L (ref 3.5–5.1)
Sodium: 136 mmol/L (ref 135–145)

## 2020-02-14 LAB — SARS CORONAVIRUS 2 BY RT PCR (HOSPITAL ORDER, PERFORMED IN ~~LOC~~ HOSPITAL LAB): SARS Coronavirus 2: NEGATIVE

## 2020-02-14 MED ORDER — ALBUTEROL (5 MG/ML) CONTINUOUS INHALATION SOLN
10.0000 mg/h | INHALATION_SOLUTION | RESPIRATORY_TRACT | Status: AC
  Administered 2020-02-14: 10 mg/h via RESPIRATORY_TRACT
  Filled 2020-02-14: qty 20

## 2020-02-14 MED ORDER — ALBUTEROL SULFATE HFA 108 (90 BASE) MCG/ACT IN AERS: 2.0000 | INHALATION_SPRAY | RESPIRATORY_TRACT | Status: DC | PRN

## 2020-02-14 MED ORDER — AEROCHAMBER Z-STAT PLUS/MEDIUM MISC: 1.0000 | Freq: Once | Status: DC

## 2020-02-14 MED ORDER — DEXAMETHASONE SODIUM PHOSPHATE 10 MG/ML IJ SOLN
10.0000 mg | Freq: Once | INTRAMUSCULAR | Status: AC
  Administered 2020-02-14: 10 mg via INTRAVENOUS
  Filled 2020-02-14: qty 1

## 2020-02-14 NOTE — ED Provider Notes (Signed)
[facility] DEPT Provider Note: Caroline Spurling, MD, FACEP  CSN: [id number] MRN: [id number] ARRIVAL: 02/14/20 at [place]: [place]  02/14/20 4:31 AM Caroline Harvey is a 59 y.o. female with a history of asthma.  Her asthma has been worse over the past few weeks but acutely worsened overnight.  Symptoms are moderate to severe.  She has been using her albuterol nebulizer at home without relief. She does not have an albuterol inhaler. On arrival she is tachypneic with inspiratory and expiratory wheezes.  She was not noted to be hypoxic.  She has had a cough but denies other symptoms of Covid such as nasal congestion, sore throat, body aches, fever or loss of taste or smell.   Past Medical History:  Diagnosis Date  . Alcohol abuse   . Anemia   . Asthma   . Hypertension   . Hyponatremia 09/2014    Past Surgical History:  Procedure Laterality Date  . ANKLE SURGERY Right    x  2    Family History  Problem Relation Age of Onset  . Hypertension Mother   . Breast cancer Paternal Aunt        ? age of onset  . Breast cancer Paternal Aunt        ? age of onset    Social History   Tobacco Use  . Smoking status: Never Smoker  . Smokeless tobacco: Never Used  Substance Use Topics  . Alcohol use: Yes    Comment: Quit May 20 2015  . Drug use: No    Prior to Admission medications   Medication Sig Start Date End Date Taking? Authorizing Provider  amLODipine (NORVASC) 5 MG tablet Take 1 tablet (5 mg total) by mouth daily. 08/03/19  Yes Connye Burkitt, NP  gabapentin (NEURONTIN) 300 MG capsule Take 1 capsule (300 mg total) by mouth 3 (three) times daily. 01/17/20 02/16/20 Yes Darr, Marguerita Beards, PA-C  lisinopril (ZESTRIL) 20 MG tablet Take 1 tablet (20 mg total) by mouth daily. 01/17/20  Yes Darr, Marguerita Beards, PA-C  traZODone (DESYREL) 100 MG tablet Take 1 tablet (100 mg total) by mouth at bedtime. For sleep 10/15/19 02/14/20   Encarnacion Slates, NP    Allergies Patient has no known allergies.   REVIEW OF SYSTEMS  Negative except as noted here or in the History of Present Illness.   PHYSICAL EXAMINATION  Initial Vital Signs Blood pressure (!) 188/125, pulse 91, temperature (!) 97.5 F (36.4 C), temperature source Axillary, resp. rate (!) 23, last menstrual period 12/14/2010, SpO2 95 %.  Examination General: Well-developed, well-nourished female in no acute distress; appearance consistent with age of record HENT: normocephalic; atraumatic Eyes: pupils equal, round and reactive to light; extraocular muscles intact Neck: supple Heart: regular rate and rhythm Lungs: Tachypnea; inspiratory and expiratory wheezes with decreased air movement Abdomen: soft; nondistended; nontender;  bowel sounds present Extremities: No deformity; full range of motion; pulses normal Neurologic: Awake, alert and oriented; motor function intact in all extremities and symmetric; no facial droop Skin: Warm and dry Psychiatric: Anxious   RESULTS  Summary of this visit's results, reviewed and interpreted by myself:   EKG Interpretation  Date/Time:    Ventricular Rate:    PR Interval:    QRS Duration:   QT Interval:    QTC Calculation:   R Axis:     Text Interpretation:  Laboratory Studies: Results for orders placed or performed during the hospital encounter of 02/14/20 (from the past 24 hour(s))  CBC with Differential/Platelet     Status: Abnormal   Collection Time: 02/14/20  4:27 AM  Result Value Ref Range   WBC 6.9 4.0 - 10.5 K/uL   RBC 4.19 3.87 - 5.11 MIL/uL   Hemoglobin 12.7 12.0 - 15.0 g/dL   HCT 39.3 36 - 46 %   MCV 93.8 80.0 - 100.0 fL   MCH 30.3 26.0 - 34.0 pg   MCHC 32.3 30.0 - 36.0 g/dL   RDW 13.2 11.5 - 15.5 %   Platelets 243 150 - 400 K/uL   nRBC 0.0 0.0 - 0.2 %   Neutrophils Relative % 51 %   Neutro Abs 3.6 1.7 - 7.7 K/uL   Lymphocytes Relative 24 %   Lymphs Abs 1.6 0.7 - 4.0 K/uL    Monocytes Relative 7 %   Monocytes Absolute 0.5 0 - 1 K/uL   Eosinophils Relative 17 %   Eosinophils Absolute 1.2 (H) 0 - 0 K/uL   Basophils Relative 1 %   Basophils Absolute 0.1 0 - 0 K/uL   Immature Granulocytes 0 %   Abs Immature Granulocytes 0.01 0.00 - 0.07 K/uL  Basic metabolic panel     Status: None   Collection Time: 02/14/20  4:27 AM  Result Value Ref Range   Sodium 136 135 - 145 mmol/L   Potassium 4.1 3.5 - 5.1 mmol/L   Chloride 101 98 - 111 mmol/L   CO2 24 22 - 32 mmol/L   Glucose, Bld 96 70 - 99 mg/dL   BUN 13 6 - 20 mg/dL   Creatinine, Ser 0.69 0.44 - 1.00 mg/dL   Calcium 9.5 8.9 - 10.3 mg/dL   GFR calc non Af Amer >60 >60 mL/min   GFR calc Af Amer >60 >60 mL/min   Anion gap 11 5 - 15   Imaging Studies: DG Chest Port 1 View  Result Date: 02/14/2020 CLINICAL DATA:  Dyspnea EXAM: PORTABLE CHEST 1 VIEW COMPARISON:  07/22/2018 FINDINGS: The lungs are symmetrically expanded. Pulmonary insufflation is normal. No pneumothorax or pleural effusion. There are mild bilateral perihilar interstitial pulmonary infiltrates, asymmetrically more severe within the right hilum which may be inflammatory, as can be seen with atypical infection, or reflect mild asymmetric perihilar pulmonary edema. Cardiac size within normal limits. No acute bone abnormality. IMPRESSION: Mild asymmetric perihilar pulmonary infiltrate, atypical infection versus edema. Electronically Signed   By: Fidela Salisbury MD   On: 02/14/2020 04:58    ED COURSE and MDM  Nursing notes, initial and subsequent vitals signs, including pulse oximetry, reviewed and interpreted by myself.  Vitals:   02/14/20 0407 02/14/20 0500 02/14/20 0530  BP: (!) 188/125 125/82 (!) 156/72  Pulse: 91 89 88  Resp: (!) 23 19 (!) 30  Temp: (!) 97.5 F (36.4 C)    TempSrc: Axillary    SpO2: 95% 100% 98%   Medications  albuterol (PROVENTIL,VENTOLIN) solution continuous neb (0 mg/hr Nebulization Stopped 02/14/20 0531)  albuterol (VENTOLIN  HFA) 108 (90 Base) MCG/ACT inhaler 2 puff (has no administration in time range)  aerochamber plus with mask device 1 each (has no administration in time range)  dexamethasone (DECADRON) injection 10 mg (10 mg Intravenous Given 02/14/20 0439)   4:35 AM Continuous albuterol neb treatment initiated.  Dexamethasone 10 mg IV ordered.  6:50 AM Patient states she feels much better. She is in no distress, tachypnea has resolved  and she is smiling. Some coarse inspiratory and expiratory sounds are present on the right but otherwise air movement has improved. I believe she is safe for discharge at this time. We will refill her albuterol inhaler.   PROCEDURES  Procedures   ED DIAGNOSES     ICD-10-CM   1. Moderate persistent asthma with exacerbation  J45.41        Yamilett Anastos, Jenny Reichmann, MD 02/14/20 [phone]

## 2020-02-14 NOTE — ED Triage Notes (Signed)
Patient arrived stating she has been struggling with her asthma for the last few weeks but worsened tonight. Reports using inhalers at home with no relief.

## 2020-03-02 ENCOUNTER — Telehealth: Payer: 59 | Admitting: Internal Medicine

## 2020-09-27 ENCOUNTER — Telehealth: Payer: Self-pay

## 2020-10-12 ENCOUNTER — Encounter: Payer: Self-pay | Admitting: Gastroenterology

## 2020-10-18 ENCOUNTER — Other Ambulatory Visit (HOSPITAL_COMMUNITY)
Admission: RE | Admit: 2020-10-18 | Discharge: 2020-10-18 | Disposition: A | Discharge status: Home / Self Care | Payer: 59 | Source: Ambulatory Visit | Attending: Obstetrics | Admitting: Obstetrics

## 2020-10-18 ENCOUNTER — Other Ambulatory Visit: Payer: Self-pay

## 2020-10-18 ENCOUNTER — Ambulatory Visit (INDEPENDENT_AMBULATORY_CARE_PROVIDER_SITE_OTHER): Payer: 59 | Admitting: Obstetrics

## 2020-10-18 ENCOUNTER — Encounter: Payer: Self-pay | Admitting: Obstetrics

## 2020-10-18 VITALS — BP 120/76 | HR 80 | Ht 69.0 in | Wt 249.0 lb

## 2020-10-18 DIAGNOSIS — Z01419 Encounter for gynecological examination (general) (routine) without abnormal findings: Secondary | ICD-10-CM

## 2020-10-18 DIAGNOSIS — R52 Pain, unspecified: Secondary | ICD-10-CM | POA: Diagnosis not present

## 2020-10-18 DIAGNOSIS — E2839 Other primary ovarian failure: Secondary | ICD-10-CM | POA: Diagnosis not present

## 2020-10-18 DIAGNOSIS — N898 Other specified noninflammatory disorders of vagina: Secondary | ICD-10-CM | POA: Diagnosis not present

## 2020-10-18 DIAGNOSIS — Z1239 Encounter for other screening for malignant neoplasm of breast: Secondary | ICD-10-CM

## 2020-10-18 MED ORDER — IBUPROFEN 800 MG PO TABS: 800.0000 mg | ORAL_TABLET | Freq: Three times a day (TID) | ORAL | 5 refills | Status: DC | PRN

## 2020-10-18 NOTE — Progress Notes (Signed)
Pt presents for annual and pap smear. Last mammogram 07/17/2017 Colonoscopy scheduled in July

## 2020-10-18 NOTE — Progress Notes (Signed)
Subjective:        Caroline Harvey is a 60 y.o. female here for a routine exam.  Current complaints: Vaginal discharge.    Personal health questionnaire:  Is patient Ashkenazi Jewish, have a family history of breast and/or ovarian cancer: yes Is there a family history of uterine cancer diagnosed at age < 11, gastrointestinal cancer, urinary tract cancer, family member who is a Field seismologist syndrome-associated carrier: no Is the patient overweight and hypertensive, family history of diabetes, personal history of gestational diabetes, preeclampsia or PCOS: no Is patient over 16, have PCOS,  family history of premature CHD under age 103, diabetes, smoke, have hypertension or peripheral artery disease:  no At any time, has a partner hit, kicked or otherwise hurt or frightened you?: no Over the past 2 weeks, have you felt down, depressed or hopeless?: no Over the past 2 weeks, have you felt little interest or pleasure in doing things?:no   Gynecologic History Patient's last menstrual period was 12/14/2010. Contraception: post menopausal status Last Pap: 2017. Results were: normal Last mammogram: 2019. Results were: normal  Obstetric History OB History  Gravida Para Term Preterm AB Living  1 1 1     1   SAB IAB Ectopic Multiple Live Births               # Outcome Date GA Lbr Len/2nd Weight Sex Delivery Anes PTL Lv  1 Term             Past Medical History:  Diagnosis Date  . Alcohol abuse   . Anemia   . Asthma   . Hypertension   . Hyponatremia 09/2014    Past Surgical History:  Procedure Laterality Date  . ANKLE SURGERY Right    x  2     Current Outpatient Medications:  .  albuterol (VENTOLIN HFA) 108 (90 Base) MCG/ACT inhaler, Inhale 2 puffs into the lungs every 4 (four) hours as needed., Disp: , Rfl:  .  amLODipine (NORVASC) 5 MG tablet, Take 1 tablet (5 mg total) by mouth daily., Disp: 30 tablet, Rfl: 0 .  gabapentin (NEURONTIN) 300 MG capsule, Take 1 capsule (300 mg total)  by mouth 3 (three) times daily., Disp: 90 capsule, Rfl: 0 .  ibuprofen (ADVIL) 800 MG tablet, Take 1 tablet (800 mg total) by mouth every 8 (eight) hours as needed., Disp: 30 tablet, Rfl: 5 .  lisinopril (ZESTRIL) 20 MG tablet, Take 1 tablet (20 mg total) by mouth daily., Disp: 30 tablet, Rfl: 1 .  SYMBICORT 160-4.5 MCG/ACT inhaler, Inhale 2 puffs into the lungs 2 (two) times daily., Disp: , Rfl:  No Known Allergies  Social History   Tobacco Use  . Smoking status: Never Smoker  . Smokeless tobacco: Never Used  Substance Use Topics  . Alcohol use: Yes    Comment: Quit May 20 2015    Family History  Problem Relation Age of Onset  . Hypertension Mother   . Breast cancer Paternal Aunt        ? age of onset  . Breast cancer Paternal Aunt        ? age of onset      Review of Systems  Constitutional: negative for fatigue and weight loss Respiratory: negative for cough and wheezing Cardiovascular: negative for chest pain, fatigue and palpitations Gastrointestinal: negative for abdominal pain and change in bowel habits Musculoskeletal:negative for myalgias Neurological: negative for gait problems and tremors Behavioral/Psych: negative for abusive relationship, depression Endocrine: negative for temperature  intolerance    Genitourinary: positive for vaginal discharge.  negative for abnormal menstrual periods, genital lesions, hot flashes, sexual problems  Integument/breast: negative for breast lump, breast tenderness, nipple discharge and skin lesion(s)    Objective:       BP 120/76   Pulse 80   Ht 5\' 9"  (1.753 m)   Wt 249 lb (112.9 kg)   LMP 12/14/2010   BMI 36.77 kg/m  General:   alert and no distress  Skin:   no rash or abnormalities  Lungs:   clear to auscultation bilaterally  Heart:   regular rate and rhythm, S1, S2 normal, no murmur, click, rub or gallop  Breasts:   normal without suspicious masses, skin or nipple changes or axillary nodes  Abdomen:  normal findings:  no organomegaly, soft, non-tender and no hernia  Pelvis:  External genitalia: normal general appearance Urinary system: urethral meatus normal and bladder without fullness, nontender Vaginal: normal without tenderness, induration or masses Cervix: normal appearance Adnexa: normal bimanual exam Uterus: anteverted and non-tender, normal size   Lab Review Urine pregnancy test Labs reviewed yes Radiologic studies reviewed yes  I have spent a total of 20 minutes of face-to-face time, excluding clinical staff time, reviewing notes and preparing to see patient, ordering tests and/or medications, and counseling the patient.  Assessment:      1. Encounter for gynecological examination with Papanicolaou smear of cervix Rx: - Cytology - PAP  2. Vaginal discharge Rx: - Cervicovaginal ancillary only  3. Pain Rx: - ibuprofen (ADVIL) 800 MG tablet; Take 1 tablet (800 mg total) by mouth every 8 (eight) hours as needed.  Dispense: 30 tablet; Refill: 5  4. Hypoestrogenism Rx: - DG BONE DENSITY (DXA); Future  5. Screening breast examination Rx: - MM 3D SCREEN BREAST BILATERAL; Future   Plan:    Education reviewed: calcium supplements, depression evaluation, low fat, low cholesterol diet, safe sex/STD prevention, self breast exams and weight bearing exercise. Mammogram ordered. Follow up in: 1 year.   Meds ordered this encounter  Medications  . ibuprofen (ADVIL) 800 MG tablet    Sig: Take 1 tablet (800 mg total) by mouth every 8 (eight) hours as needed.    Dispense:  30 tablet    Refill:  5   Orders Placed This Encounter  Procedures  . MM 3D SCREEN BREAST BILATERAL    Standing Status:   Future    Standing Expiration Date:   10/18/2021    Order Specific Question:   Reason for Exam (SYMPTOM  OR DIAGNOSIS REQUIRED)    Answer:   Needs routine screening    Order Specific Question:   Is the patient pregnant?    Answer:   No    Order Specific Question:   Preferred imaging location?     Answer:   [facility]  . DG BONE DENSITY (DXA)    Standing Status:   Future    Standing Expiration Date:   10/18/2021    Order Specific Question:   Reason for Exam (SYMPTOM  OR DIAGNOSIS REQUIRED)    Answer:   Screening    Order Specific Question:   Is the patient pregnant?    Answer:   No    Order Specific Question:   Preferred imaging location?    Answer:   [facility]    Shelly Bombard, MD 10/18/2020 12:02 PM

## 2020-10-19 LAB — CERVICOVAGINAL ANCILLARY ONLY
Bacterial Vaginitis (gardnerella): POSITIVE — AB
Candida Glabrata: NEGATIVE
Candida Vaginitis: NEGATIVE
Chlamydia: NEGATIVE
Comment: NEGATIVE
Comment: NEGATIVE
Comment: NEGATIVE
Comment: NEGATIVE
Comment: NEGATIVE
Comment: NORMAL
Neisseria Gonorrhea: NEGATIVE
Trichomonas: NEGATIVE

## 2020-10-20 ENCOUNTER — Other Ambulatory Visit: Payer: Self-pay | Admitting: Obstetrics

## 2020-10-20 DIAGNOSIS — B9689 Other specified bacterial agents as the cause of diseases classified elsewhere: Secondary | ICD-10-CM

## 2020-10-20 LAB — CYTOLOGY - PAP
Comment: NEGATIVE
Diagnosis: NEGATIVE
High risk HPV: NEGATIVE

## 2020-10-20 MED ORDER — METRONIDAZOLE 500 MG PO TABS: 500.0000 mg | ORAL_TABLET | Freq: Two times a day (BID) | ORAL | 2 refills | Status: DC

## 2020-12-11 ENCOUNTER — Ambulatory Visit (AMBULATORY_SURGERY_CENTER): Payer: Self-pay | Admitting: *Deleted

## 2020-12-11 ENCOUNTER — Other Ambulatory Visit: Payer: Self-pay

## 2020-12-11 VITALS — Ht 69.0 in | Wt 255.0 lb

## 2020-12-11 DIAGNOSIS — Z1211 Encounter for screening for malignant neoplasm of colon: Secondary | ICD-10-CM

## 2020-12-11 MED ORDER — NA SULFATE-K SULFATE-MG SULF 17.5-3.13-1.6 GM/177ML PO SOLN: ORAL | 0 refills | Status: DC

## 2020-12-11 NOTE — Progress Notes (Signed)
Patient is here in-person for PV. Patient denies any allergies to eggs or soy. Patient denies any problems with anesthesia/sedation. Patient denies any oxygen use at home. Patient denies taking any diet/weight loss medications or blood thinners. Patient is not being treated for MRSA or C-diff. Patient is aware of our care-partner policy and PNPYY-51 safety protocol. Colonoscopy pamphlet given to pt.

## 2020-12-25 ENCOUNTER — Encounter: Payer: Self-pay | Admitting: Gastroenterology

## 2020-12-25 ENCOUNTER — Ambulatory Visit (AMBULATORY_SURGERY_CENTER): Payer: 59 | Admitting: Gastroenterology

## 2020-12-25 ENCOUNTER — Other Ambulatory Visit: Payer: Self-pay

## 2020-12-25 VITALS — BP 117/54 | HR 50 | Temp 98.0°F | Resp 12 | Ht 69.0 in | Wt 255.0 lb

## 2020-12-25 DIAGNOSIS — D128 Benign neoplasm of rectum: Secondary | ICD-10-CM

## 2020-12-25 DIAGNOSIS — D123 Benign neoplasm of transverse colon: Secondary | ICD-10-CM

## 2020-12-25 DIAGNOSIS — D125 Benign neoplasm of sigmoid colon: Secondary | ICD-10-CM

## 2020-12-25 DIAGNOSIS — D124 Benign neoplasm of descending colon: Secondary | ICD-10-CM

## 2020-12-25 DIAGNOSIS — Z1211 Encounter for screening for malignant neoplasm of colon: Secondary | ICD-10-CM

## 2020-12-25 DIAGNOSIS — D129 Benign neoplasm of anus and anal canal: Secondary | ICD-10-CM

## 2020-12-25 MED ORDER — SODIUM CHLORIDE 0.9 % IV SOLN: 500.0000 mL | Freq: Once | INTRAVENOUS | Status: DC

## 2020-12-25 NOTE — Progress Notes (Signed)
Pt's states no medical or surgical changes since previsit or office visit.   CHECK-IN-AER  V/S-CW

## 2020-12-25 NOTE — Op Note (Signed)
[place] Patient Name: Caroline Harvey Procedure Date: 12/25/2020 10:54 AM MRN: [id number] Endoscopist: Mallie Mussel L. Loletha Carrow , MD Age: 60 Referring MD:  Date of Birth: [birth year] Gender: Female Account #: [id number] Procedure:                Colonoscopy Indications:              Screening for colorectal malignant neoplasm, This                            is the patient's first colonoscopy Medicines:                Monitored Anesthesia Care Procedure:                Pre-Anesthesia Assessment:                           - Prior to the procedure, a History and Physical                            was performed, and patient medications and                            allergies were reviewed. The patient's tolerance of                            previous anesthesia was also reviewed. The risks                            and benefits of the procedure and the sedation                            options and risks were discussed with the patient.                            All questions were answered, and informed consent                            was obtained. Prior Anticoagulants: The patient has                            taken no previous anticoagulant or antiplatelet                            agents except for aspirin. ASA Grade Assessment:                            III - A patient with severe systemic disease. After                            reviewing the risks and benefits, the patient was                            deemed in satisfactory condition to undergo the  procedure.                           After obtaining informed consent, the colonoscope                            was passed under direct vision. Throughout the                            procedure, the patient's blood pressure, pulse, and                            oxygen saturations were monitored continuously. The                            CF [id number] was introduced through the anus                             and advanced to the the terminal ileum, with                            identification of the appendiceal orifice and IC                            valve. The colonoscopy was performed without                            difficulty. The patient tolerated the procedure                            well. The quality of the bowel preparation was                            good. The terminal ileum, ileocecal valve,                            appendiceal orifice, and rectum were photographed. Scope In: 10:59:17 AM Scope Out: 11:17:02 AM Scope Withdrawal Time: 0 hours 12 minutes 18 seconds  Total Procedure Duration: 0 hours 17 minutes 45 seconds  Findings:                 The perianal and digital rectal examinations were                            normal.                           The terminal ileum appeared normal.                           Four sessile polyps were found in the rectum,                            sigmoid colon, descending colon and transverse  colon. The polyps were 3 to 6 mm in size. These                            polyps were removed with a cold snare. Resection                            and retrieval were complete.                           The exam was otherwise without abnormality on                            direct and retroflexion views. Complications:            No immediate complications. Estimated Blood Loss:     Estimated blood loss was minimal. Impression:               - The examined portion of the ileum was normal.                           - Four 3 to 6 mm polyps in the rectum, in the                            sigmoid colon, in the descending colon and in the                            transverse colon, removed with a cold snare.                            Resected and retrieved.                           - The examination was otherwise normal on direct                            and retroflexion  views. Recommendation:           - Patient has a contact number available for                            emergencies. The signs and symptoms of potential                            delayed complications were discussed with the                            patient. Return to normal activities tomorrow.                            Written discharge instructions were provided to the                            patient.                           -  Resume previous diet.                           - Continue present medications.                           - Await pathology results.                           - Repeat colonoscopy is recommended for                            surveillance. The colonoscopy date will be                            determined after pathology results from today's                            exam become available for review. Haadiya Frogge L. Loletha Carrow, MD 12/25/2020 11:25:02 AM This report has been signed electronically.

## 2020-12-25 NOTE — Progress Notes (Signed)
pt tolerated well. VSS. awake and to recovery. Report given to RN.  

## 2020-12-25 NOTE — Patient Instructions (Signed)
Await pathology  Please read handout about polyps Continue your normal medications  YOU HAD AN ENDOSCOPIC PROCEDURE TODAY AT [facility]:   Refer to the procedure report that was given to you for any specific questions about what was found during the examination.  If the procedure report does not answer your questions, please call your gastroenterologist to clarify.  If you requested that your care partner not be given the details of your procedure findings, then the procedure report has been included in a sealed envelope for you to review at your convenience later.  YOU SHOULD EXPECT: Some feelings of bloating in the abdomen. Passage of more gas than usual.  Walking can help get rid of the air that was put into your GI tract during the procedure and reduce the bloating. If you had a lower endoscopy (such as a colonoscopy or flexible sigmoidoscopy) you may notice spotting of blood in your stool or on the toilet paper. If you underwent a bowel prep for your procedure, you may not have a normal bowel movement for a few days.  Please Note:  You might notice some irritation and congestion in your nose or some drainage.  This is from the oxygen used during your procedure.  There is no need for concern and it should clear up in a day or so.  SYMPTOMS TO REPORT IMMEDIATELY:  Following lower endoscopy (colonoscopy or flexible sigmoidoscopy):  Excessive amounts of blood in the stool  Significant tenderness or worsening of abdominal pains  Swelling of the abdomen that is new, acute  Fever of 100F or high  For urgent or emergent issues, a gastroenterologist can be reached at any hour by calling [phone]. Do not use MyChart messaging for urgent concerns.    DIET:  We do recommend a small meal at first, but then you may proceed to your regular diet.  Drink plenty of fluids but you should avoid alcoholic beverages for 24 hours.  ACTIVITY:  You should plan to take it easy for the  rest of today and you should NOT DRIVE or use heavy machinery until tomorrow (because of the sedation medicines used during the test).    FOLLOW UP: Our staff will call the number listed on your records 48-72 hours following your procedure to check on you and address any questions or concerns that you may have regarding the information given to you following your procedure. If we do not reach you, we will leave a message.  We will attempt to reach you two times.  During this call, we will ask if you have developed any symptoms of COVID 19. If you develop any symptoms (ie: fever, flu-like symptoms, shortness of breath, cough etc.) before then, please call [phone].  If you test positive for Covid 19 in the 2 weeks post procedure, please call and report this information to [place].    If any biopsies were taken you will be contacted by phone or by letter within the next 1-3 weeks.  Please call us at [phone] if you have not heard about the biopsies in 3 weeks.    SIGNATURES/CONFIDENTIALITY: You and/or your care partner have signed paperwork which will be entered into your electronic medical record.  These signatures attest to the fact that that the information above on your After Visit Summary has been reviewed and is understood.  Full responsibility of the confidentiality of this discharge information lies with you and/or your care-partner.

## 2020-12-25 NOTE — Progress Notes (Signed)
History:  This patient presents for endoscopic testing for screening.  Caroline Harvey Referring physician: Malena Peer, MD  Past Medical History: Past Medical History:  Diagnosis Date   Alcohol abuse    Anemia    Asthma    Hypertension    Hyponatremia 09/2014     Past Surgical History: Past Surgical History:  Procedure Laterality Date   ANKLE SURGERY Right    x  2    Allergies: No Known Allergies  Outpatient Meds: Current Outpatient Medications  Medication Sig Dispense Refill   albuterol (VENTOLIN HFA) 108 (90 Base) MCG/ACT inhaler Inhale 2 puffs into the lungs every 4 (four) hours as needed.     SYMBICORT 160-4.5 MCG/ACT inhaler Inhale 2 puffs into the lungs 2 (two) times daily.     Acetaminophen (TYLENOL) 325 MG CAPS Take by mouth.     gabapentin (NEURONTIN) 300 MG capsule Take 1 capsule (300 mg total) by mouth 3 (three) times daily. 90 capsule 0   lisinopril (ZESTRIL) 20 MG tablet Take 1 tablet (20 mg total) by mouth daily. 30 tablet 1   Current Facility-Administered Medications  Medication Dose Route Frequency Provider Last Rate Last Admin   0.9 %  sodium chloride infusion  500 mL Intravenous Once Danis, Kirke Corin, MD          ___________________________________________________________________ Objective   Exam:  BP 124/66 (Patient Position: Sitting)   Pulse 73   Temp 98 F (36.7 C)   Ht '5\' 9"'$  (1.753 m)   Wt 255 lb (115.7 kg)   LMP 12/14/2010   SpO2 100%   BMI 37.66 kg/m   CV: RRR without murmur, S1/S2, no JVD, no peripheral edema Resp: clear to auscultation bilaterally, normal RR and effort noted GI: soft, no tenderness, with active bowel sounds. No guarding or palpable organomegaly noted. Neuro: awake, alert and oriented x 3. Normal gross motor function and fluent speech   Assessment:  Average risk CRC  Plan:  colonoscopy   Caroline Harvey

## 2020-12-25 NOTE — Progress Notes (Signed)
Called to room to assist during endoscopic procedure.  Patient ID and intended procedure confirmed with present staff. Received instructions for my participation in the procedure from the performing physician.  

## 2020-12-27 ENCOUNTER — Telehealth: Payer: Self-pay

## 2020-12-27 ENCOUNTER — Telehealth: Payer: Self-pay | Admitting: *Deleted

## 2020-12-27 NOTE — Telephone Encounter (Signed)
  Follow up Call-  Call back number 12/25/2020  Post procedure Call Back phone  # [phone]  Permission to leave phone message Yes  Some recent data might be hidden     Patient questions:  Do you have a fever, pain , or abdominal swelling? No. Pain Score  0 *  Have you tolerated food without any problems? Yes.    Have you been able to return to your normal activities? Yes.    Do you have any questions about your discharge instructions: Diet   No. Medications  No. Follow up visit  No.  Do you have questions or concerns about your Care? No.  Actions: * If pain score is 4 or above: No action needed, pain <4.  Have you developed a fever since your procedure? no  2.   Have you had an respiratory symptoms (SOB or cough) since your procedure? no  3.   Have you tested positive for COVID 19 since your procedure no  4.   Have you had any family members/close contacts diagnosed with the COVID 19 since your procedure?  no   If yes to any of these questions please route to Joylene John, RN and Joella Prince, RN

## 2020-12-27 NOTE — Telephone Encounter (Signed)
NO ANSWER, MESSAGE LEFT FOR PATIENT. 

## 2021-01-01 ENCOUNTER — Encounter: Payer: Self-pay | Admitting: Gastroenterology

## 2021-06-05 ENCOUNTER — Other Ambulatory Visit (INDEPENDENT_AMBULATORY_CARE_PROVIDER_SITE_OTHER): Payer: 59

## 2021-06-05 ENCOUNTER — Ambulatory Visit (INDEPENDENT_AMBULATORY_CARE_PROVIDER_SITE_OTHER): Payer: 59 | Admitting: Gastroenterology

## 2021-06-05 ENCOUNTER — Encounter: Payer: Self-pay | Admitting: Gastroenterology

## 2021-06-05 VITALS — BP 110/70 | HR 70 | Ht 69.0 in | Wt 263.0 lb

## 2021-06-05 DIAGNOSIS — K5909 Other constipation: Secondary | ICD-10-CM

## 2021-06-05 DIAGNOSIS — R140 Abdominal distension (gaseous): Secondary | ICD-10-CM | POA: Diagnosis not present

## 2021-06-05 LAB — T4, FREE: Free T4: 0.85 ng/dL (ref 0.60–1.60)

## 2021-06-05 LAB — CALCIUM: Calcium: 9.3 mg/dL (ref 8.4–10.5)

## 2021-06-05 LAB — TSH: TSH: 2.2 u[IU]/mL (ref 0.35–5.50)

## 2021-06-05 NOTE — Progress Notes (Signed)
[facility] Consult Note:  History: Caroline Harvey 06/05/2021  Referring provider: Malena Peer, MD  Reason for consult/chief complaint: Bloated (Pt reports gas and bloating, especially after she eats;  she feels like her food does not digest and just sits in her stomach; reports her bm's are normal)   Subjective  HPI: Caroline Harvey returned to see me today for abdominal bloating and constipation.  She recalls that it has been going on for 5 to 6 months, approximately the time of her colonoscopy.  She slowly developed constipation perhaps sometime before that, she cannot recall the timing for certain.  She tends to have a BM every 3 to 4 days.  For the last 5 to 6 months there has been abdominal bloating where she feels full and uncomfortable after eating until things finally "passed lower".  She has taken some kind of bowel cleanse product from [facility] on a few occasions with relief.  She denies early satiety, nausea, vomiting or dysphagia.  Denies rectal bleeding. She confided in me that she is a recovering alcoholic, sober about 18 months, and during that time has been less physically active.  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  She does find improvement in her constipation when she has greens and pinto beans.  Metamucil just made her feel bloated.  Screening colonoscopy with me on 12/25/2020, complete exam to TI, good prep, 4 diminutive TA's removed.  ROS:  Review of Systems  Constitutional:  Negative for appetite change and unexpected weight change.  HENT:  Negative for mouth sores and voice change.   Eyes:  Negative for pain and redness.  Respiratory:  Negative for cough and shortness of breath.   Cardiovascular:  Negative for chest pain and palpitations.  Genitourinary:  Negative for dysuria and hematuria.  Musculoskeletal:  Negative for arthralgias and myalgias.  Skin:  Negative for pallor and rash.  Neurological:  Negative for weakness  and headaches.  Hematological:  Negative for adenopathy.    Past Medical History: Past Medical History:  Diagnosis Date   Alcohol abuse    Anemia    Asthma    Hypertension    Hyponatremia 09/2014     Past Surgical History: Past Surgical History:  Procedure Laterality Date   ANKLE SURGERY Right    x  2     Family History: Family History  Problem Relation Age of Onset   Hypertension Mother    Colon cancer Maternal Aunt    Breast cancer Paternal 26        ? age of onset   Breast cancer Paternal 5        ? age of onset   Stomach cancer Maternal Grandmother    Esophageal cancer Neg Hx    Rectal cancer Neg Hx     Social History: Social History   Socioeconomic History   Marital status: Single    Spouse name: Not on file   Number of children: Not on file   Years of education: Not on file   Highest education level: Not on file  Occupational History   Not on file  Tobacco Use   Smoking status: Never   Smokeless tobacco: Current   Tobacco comments:    VAPING  Vaping Use   Vaping Use: Every day   Substances: Nicotine  Substance and Sexual Activity   Alcohol use: Not Currently    Comment: Quit May 20 2015   Drug use: No  Sexual activity: Not Currently    Partners: Female  Other Topics Concern   Not on file  Social History Narrative   Not on file   Social Determinants of Health   Financial Resource Strain: Not on file  Food Insecurity: Not on file  Transportation Needs: Not on file  Physical Activity: Not on file  Stress: Not on file  Social Connections: Not on file    Allergies: No Known Allergies  Outpatient Meds: Current Outpatient Medications  Medication Sig Dispense Refill   Acetaminophen (TYLENOL) 325 MG CAPS Take by mouth.     albuterol (VENTOLIN HFA) 108 (90 Base) MCG/ACT inhaler Inhale 2 puffs into the lungs every 4 (four) hours as needed.     lisinopril (ZESTRIL) 20 MG tablet Take 1 tablet (20 mg total) by mouth daily. 30 tablet 1    SYMBICORT 160-4.5 MCG/ACT inhaler Inhale 2 puffs into the lungs 2 (two) times daily.     gabapentin (NEURONTIN) 300 MG capsule Take 1 capsule (300 mg total) by mouth 3 (three) times daily. 90 capsule 0   No current facility-administered medications for this visit.      ___________________________________________________________________ Objective   Exam:  BP 110/70    Pulse 70    Ht 5\' 9"  (1.753 m)    Wt 263 lb (119.3 kg)    LMP 12/14/2010    BMI 38.84 kg/m  Wt Readings from Last 3 Encounters:  06/05/21 263 lb (119.3 kg)  12/25/20 255 lb (115.7 kg)  12/11/20 255 lb (115.7 kg)   (Note weight has increased since last visit)  General: Well-appearing Eyes: sclera anicteric, no redness ENT: oral mucosa moist without lesions, no cervical or supraclavicular lymphadenopathy CV: RRR without murmur, S1/S2, no JVD, no peripheral edema Resp: clear to auscultation bilaterally, normal RR and effort noted GI: soft, mild periumbilical tenderness, with active bowel sounds. No guarding or palpable organomegaly noted. Skin; warm and dry, no rash or jaundice noted Neuro: awake, alert and oriented x 3. Normal gross motor function and fluent speech  Labs: No recent data for review.  Says she has not seen her PCP since perhaps early last year.  Assessment: Encounter Diagnoses  Name Primary?   Chronic constipation Yes   Abdominal bloating     Chronic constipation and subsequent onset of abdominal bloating, 2 issues likely related. No unexpected weight loss, vomiting or rectal bleeding. We discussed need for adequate dietary fiber, how certain foodstuffs can cause maldigestion and bloating (written dietary advice given), need for increased exercise and water.  Plan: TSH, free T4 and calcium level today  Increase dietary fiber  MiraLAX half capful a day increasing to 1 capful if needed  Return to office about 6 weeks.   Thank you for the courtesy of this consult.  Please call me with  any questions or concerns.  Nelida Meuse III  CC: Referring provider noted above

## 2021-06-05 NOTE — Patient Instructions (Signed)
If you are age 60 or older, your body mass index should be between 23-30. Your Body mass index is 38.84 kg/m. If this is out of the aforementioned range listed, please consider follow up with your Primary Care Provider.  If you are age 50 or younger, your body mass index should be between 19-25. Your Body mass index is 38.84 kg/m. If this is out of the aformentioned range listed, please consider follow up with your Primary Care Provider.   ________________________________________________________  The [facility] providers would like to encourage you to use [facility] to communicate with providers for non-urgent requests or questions.  Due to long hold times on the telephone, sending your provider a message by [facility] may be a faster and more efficient way to get a response.  Please allow 48 business hours for a response.  Please remember that this is for non-urgent requests.  _______________________________________________________  Due to recent changes in healthcare laws, you may see the results of your imaging and laboratory studies on MyChart before your provider has had a chance to review them.  We understand that in some cases there may be results that are confusing or concerning to you. Not all laboratory results come back in the same time frame and the provider may be waiting for multiple results in order to interpret others.  Please give [place] 48 hours in order for your provider to thoroughly review all the results before contacting the office for clarification of your results.   Your provider has requested that you go to the basement level for lab work before leaving today. Press "B" on the elevator. The lab is located at the first door on the left as you exit the elevator.  Please take 1/2 capful of Miralax daily, increase to 1 capful if needed.   Follow up in 6 weeks.   _______________________________________________________  Food Guidelines for those with chronic digestive trouble:  Many  people have difficulty digesting certain foods, causing a variety of distressing and embarrassing symptoms such as abdominal pain, bloating and gas.  These foods may need to be avoided or consumed in small amounts.  Here are some tips that might be helpful for you.  1.   Lactose intolerance is the difficulty or complete inability to digest lactose, the natural sugar in milk and anything made from milk.  This condition is harmless, common, and can begin any time during life.  Some people can digest a modest amount of lactose while others cannot tolerate any.  Also, not all dairy products contain equal amounts of lactose.  For example, hard cheeses such as parmesan have less lactose than soft cheeses such as cheddar.  Yogurt has less lactose than milk or cheese.  Many packaged foods (even many brands of bread) have milk, so read ingredient lists carefully.  It is difficult to test for lactose intolerance, so just try avoiding lactose as much as possible for a week and see what happens with your symptoms.  If you seem to be lactose intolerant, the best plan is to avoid it (but make sure you get calcium from another source).  The next best thing is to use lactase enzyme supplements, available over the counter everywhere.  Just know that many lactose intolerant people need to take several tablets with each serving of dairy to avoid symptoms.  Lastly, a lot of restaurant food is made with milk or butter.  Many are things you might not suspect, such as mashed potatoes, rice and pasta (cooked with  butter) and "grilled" items.  If you are lactose intolerant, it never hurts to ask your server what has milk or butter.  2.   Fiber is an important part of your diet, but not all fiber is well-tolerated.  Insoluble fiber such as bran is often consumed by normal gut bacteria and converted into gas.  Soluble fiber such as oats, squash, carrots and green beans are typically tolerated better.  3.   Some types of carbohydrates  can be poorly digested.  Examples include: fructose (apples, cherries, pears, raisins and other dried fruits), fructans (onions, zucchini, large amounts of wheat), sorbitol/mannitol/xylitol and sucralose/Splenda (common artificial sweeteners), and raffinose (lentils, broccoli, cabbage, asparagus, brussel sprouts, many types of beans).  Do a Development worker, community for [employer] and you will find helpful information. Beano, a dietary supplement, will often help with raffinose-containing foods.  As with lactase tablets, you may need several per serving.  4.   Whenever possible, avoid processed food&meats and chemical additives.  High fructose corn syrup, a common sweetener, may be difficult to digest.  Eggs and soy (comes from the soybean, and added to many foods now) are other common bloating/gassy foods.  5.  Regarding gluten:  gluten is a protein mainly found in wheat, but also rye and barley.  There is a condition called celiac sprue, which is an inflammatory reaction in the small intestine causing a variety of digestive symptoms.  Blood testing is highly reliable to look for this condition, and sometimes upper endoscopy with small bowel biopsies may be necessary to make the diagnosis.  Many patients who test negative for celiac sprue report improvement in their digestive symptoms when they switch to a gluten-free diet.  However, in these "non-celiac gluten sensitive" patients, the true role of gluten in their symptoms is unclear.  Reducing carbohydrates in general may decrease the gas and bloating caused when gut bacteria consume carbs. Also, some of these patients may actually be intolerant of the baker's yeast in bread products rather than the gluten.  Flatbread and other reduced yeast breads might therefore be tolerated.  There is no specific testing available for most food intolerances, which are discovered mainly by dietary elimination.  Please do not embark on a gluten free diet unless directed by your doctor,  as it is highly restrictive, and may lead to nutritional deficiencies if not carefully monitored.  Lastly, beware of internet claims offering "personalized" tests for food intolerances.  Such testing has no reliable scientific evidence to support its reliability and correlation to symptoms.    6.  The best advice is old advice, especially for those with chronic digestive trouble - try to eat "clean".  Balanced diet, avoid processed food, plenty of fruits and vegetables, cut down the sugar, minimal alcohol, avoid tobacco. Make time to care for yourself, get enough sleep, exercise when you can, reduce stress.  Your guts will thank you for it.   - Dr. [facility]  ____________________________________________________________

## 2021-07-18 ENCOUNTER — Ambulatory Visit: Payer: 59 | Admitting: Gastroenterology

## 2021-12-18 ENCOUNTER — Other Ambulatory Visit: Payer: Self-pay | Admitting: Internal Medicine

## 2021-12-18 DIAGNOSIS — Z1231 Encounter for screening mammogram for malignant neoplasm of breast: Secondary | ICD-10-CM

## 2022-01-07 ENCOUNTER — Ambulatory Visit
Admission: RE | Admit: 2022-01-07 | Discharge: 2022-01-07 | Disposition: A | Discharge status: Home / Self Care | Payer: 59 | Source: Ambulatory Visit | Attending: Internal Medicine | Admitting: Internal Medicine

## 2022-01-07 DIAGNOSIS — Z1231 Encounter for screening mammogram for malignant neoplasm of breast: Secondary | ICD-10-CM

## 2022-02-27 ENCOUNTER — Other Ambulatory Visit: Payer: Self-pay | Admitting: *Deleted

## 2022-03-15 ENCOUNTER — Other Ambulatory Visit: Payer: Self-pay | Admitting: Internal Medicine

## 2022-03-15 DIAGNOSIS — Z13820 Encounter for screening for osteoporosis: Secondary | ICD-10-CM

## 2022-03-25 ENCOUNTER — Other Ambulatory Visit: Payer: Self-pay | Admitting: Otolaryngology

## 2022-03-26 NOTE — Pre-Procedure Instructions (Signed)
Surgical Instructions    Your procedure is scheduled on Friday 04/05/22.   Report to [facility] Main Entrance "A" at 07:00 A.M., then check in with the Admitting office.  Call this number if you have problems the morning of surgery:  [phone]   If you have any questions prior to your surgery date call [phone]: Open Monday-Friday 8am-4pm If you experience any cold or flu symptoms such as cough, fever, chills, shortness of breath, etc. between now and your scheduled surgery, please notify us at the above number     Remember:  Do not eat after midnight the night before your surgery  You may drink clear liquids until 06:00 A.M. the morning of your surgery.   Clear liquids allowed are: Water, Non-Citrus Juices (without pulp), Carbonated Beverages, Clear Tea, Black Coffee ONLY (NO MILK, CREAM OR POWDERED CREAMER of any kind), and Gatorade    Take these medicines the morning of surgery with A SIP OF WATER:   gabapentin (NEURONTIN)   SYMBICORT     If needed:   Acetaminophen (TYLENOL)  albuterol (VENTOLIN HFA)    As of today, STOP taking any Aspirin (unless otherwise instructed by your surgeon) Aleve, Naproxen, Ibuprofen, Motrin, Advil, Goody's, BC's, all herbal medications, fish oil, and all vitamins.           Do not wear jewelry or makeup. Do not wear lotions, powders, perfumes/cologne or deodorant. Do not shave 48 hours prior to surgery.  Men may shave face and neck. Do not bring valuables to the hospital. Do not wear nail polish, gel polish, artificial nails, or any other type of covering on natural nails (fingers and toes) If you have artificial nails or gel coating that need to be removed by a nail salon, please have this removed prior to surgery. Artificial nails or gel coating may interfere with anesthesia's ability to adequately monitor your vital signs.  [facility] is not responsible for any belongings or valuables.    Do NOT Smoke (Tobacco/Vaping)  24 hours prior  to your procedure  If you use a CPAP at night, you may bring your mask for your overnight stay.   Contacts, glasses, hearing aids, dentures or partials may not be worn into surgery, please bring cases for these belongings   For patients admitted to the hospital, discharge time will be determined by your treatment team.   Patients discharged the day of surgery will not be allowed to drive home, and someone needs to stay with them for 24 hours.   SURGICAL WAITING ROOM VISITATION Patients having surgery or a procedure may have no more than 2 support people in the waiting area - these visitors may rotate.   Children under the age of 77 must have an adult with them who is not the patient. If the patient needs to stay at the hospital during part of their recovery, the visitor guidelines for inpatient rooms apply. Pre-op nurse will coordinate an appropriate time for 1 support person to accompany patient in pre-op.  This support person may not rotate.   Please refer to [email or web address] for the visitor guidelines for Inpatients (after your surgery is over and you are in a regular room).    Special instructions:    Oral Hygiene is also important to reduce your risk of infection.  Remember - BRUSH YOUR TEETH THE MORNING OF SURGERY WITH YOUR REGULAR TOOTHPASTE   [facility]- Preparing For Surgery  Before surgery, you can play an important role. Because skin is not  sterile, your skin needs to be as free of germs as possible. You can reduce the number of germs on your skin by washing with CHG (chlorahexidine gluconate) Soap before surgery.  CHG is an antiseptic cleaner which kills germs and bonds with the skin to continue killing germs even after washing.     Please do not use if you have an allergy to CHG or antibacterial soaps. If your skin becomes reddened/irritated stop using the CHG.  Do not shave (including legs and underarms) for at least  48 hours prior to first CHG shower. It is OK to shave your face.  Please follow these instructions carefully.     Shower the NIGHT BEFORE SURGERY and the MORNING OF SURGERY with CHG Soap.   If you chose to wash your hair, wash your hair first as usual with your normal shampoo. After you shampoo, rinse your hair and body thoroughly to remove the shampoo.  Then [employer] and genitals (private parts) with your normal soap and rinse thoroughly to remove soap.  After that Use CHG Soap as you would any other liquid soap. You can apply CHG directly to the skin and wash gently with a scrungie or a clean washcloth.   Apply the CHG Soap to your body ONLY FROM THE NECK DOWN.  Do not use on open wounds or open sores. Avoid contact with your eyes, ears, mouth and genitals (private parts). Wash Face and genitals (private parts)  with your normal soap.   Wash thoroughly, paying special attention to the area where your surgery will be performed.  Thoroughly rinse your body with warm water from the neck down.  DO NOT shower/wash with your normal soap after using and rinsing off the CHG Soap.  Pat yourself dry with a CLEAN TOWEL.  Wear CLEAN PAJAMAS to bed the night before surgery  Place CLEAN SHEETS on your bed the night before your surgery  DO NOT SLEEP WITH PETS.   Day of Surgery:  Take a shower with CHG soap. Wear Clean/Comfortable clothing the morning of surgery Do not apply any deodorants/lotions.   Remember to brush your teeth WITH YOUR REGULAR TOOTHPASTE.    If you received a COVID test during your pre-op visit, it is requested that you wear a mask when out in public, stay away from anyone that may not be feeling well, and notify your surgeon if you develop symptoms. If you have been in contact with anyone that has tested positive in the last 10 days, please notify your surgeon.    Please read over the following fact sheets that you were given.

## 2022-03-27 ENCOUNTER — Encounter (HOSPITAL_COMMUNITY): Payer: Self-pay

## 2022-03-27 ENCOUNTER — Other Ambulatory Visit: Payer: Self-pay

## 2022-03-27 ENCOUNTER — Encounter (HOSPITAL_COMMUNITY)
Admission: RE | Admit: 2022-03-27 | Discharge: 2022-03-27 | Disposition: A | Discharge status: Home / Self Care | Payer: 59 | Source: Ambulatory Visit | Attending: Otolaryngology | Admitting: Otolaryngology

## 2022-03-27 VITALS — BP 147/76 | HR 60 | Temp 97.8°F | Resp 17 | Ht 69.0 in | Wt 246.8 lb

## 2022-03-27 DIAGNOSIS — Z01818 Encounter for other preprocedural examination: Secondary | ICD-10-CM | POA: Diagnosis present

## 2022-03-27 DIAGNOSIS — I10 Essential (primary) hypertension: Secondary | ICD-10-CM | POA: Insufficient documentation

## 2022-03-27 DIAGNOSIS — R001 Bradycardia, unspecified: Secondary | ICD-10-CM | POA: Insufficient documentation

## 2022-03-27 HISTORY — DX: Anxiety disorder, unspecified: F41.9

## 2022-03-27 HISTORY — DX: Gastro-esophageal reflux disease without esophagitis: K21.9

## 2022-03-27 NOTE — Pre-Procedure Instructions (Signed)
Surgical Instructions    Your procedure is scheduled on Friday 04/05/22.   Report to [facility] Main Entrance "A" at 07:00 A.M., then check in with the Admitting office.  Call this number if you have problems the morning of surgery:  [phone]   If you have any questions prior to your surgery date call [phone]: Open Monday-Friday 8am-4pm If you experience any cold or flu symptoms such as cough, fever, chills, shortness of breath, etc. between now and your scheduled surgery, please notify us at the above number     Remember:  Do not eat after midnight the night before your surgery  You may drink clear liquids until 06:00 A.M. the morning of your surgery.   Clear liquids allowed are: Water, Non-Citrus Juices (without pulp), Carbonated Beverages, Clear Tea, Black Coffee ONLY (NO MILK, CREAM OR POWDERED CREAMER of any kind), and Gatorade    Take these medicines the morning of surgery with A SIP OF WATER:   gabapentin (NEURONTIN)   SYMBICORT   metoCLOPramide (REGLAN)   pantoprazole (PROTONIX)     If needed:   albuterol (VENTOLIN HFA)- if needed, bring with you on day of surgery   As of today, STOP taking any Aspirin (unless otherwise instructed by your surgeon) Aleve, Naproxen, Ibuprofen, Motrin, Advil, Goody's, BC's, all herbal medications, fish oil, and all vitamins.           Do not wear jewelry or makeup. Do not wear lotions, powders, perfumes or deodorant. Do not shave 48 hours prior to surgery.   Do not bring valuables to the hospital. Do not wear nail polish, gel polish, artificial nails, or any other type of covering on natural nails (fingers and toes) If you have artificial nails or gel coating that need to be removed by a nail salon, please have this removed prior to surgery. Artificial nails or gel coating may interfere with anesthesia's ability to adequately monitor your vital signs.  [facility] is not responsible for any belongings or valuables.    Do NOT  Smoke (Tobacco/Vaping)  24 hours prior to your procedure  If you use a CPAP at night, you may bring your mask for your overnight stay.   Contacts, glasses, hearing aids, dentures or partials may not be worn into surgery, please bring cases for these belongings   For patients admitted to the hospital, discharge time will be determined by your treatment team.   Patients discharged the day of surgery will not be allowed to drive home, and someone needs to stay with them for 24 hours.   SURGICAL WAITING ROOM VISITATION Patients having surgery or a procedure may have no more than 2 support people in the waiting area - these visitors may rotate.   Children under the age of 23 must have an adult with them who is not the patient. If the patient needs to stay at the hospital during part of their recovery, the visitor guidelines for inpatient rooms apply. Pre-op nurse will coordinate an appropriate time for 1 support person to accompany patient in pre-op.  This support person may not rotate.   Please refer to [email or web address] for the visitor guidelines for Inpatients (after your surgery is over and you are in a regular room).    Special instructions:    Oral Hygiene is also important to reduce your risk of infection.  Remember - BRUSH YOUR TEETH THE MORNING OF SURGERY WITH YOUR REGULAR TOOTHPASTE   [facility]- Preparing For Surgery  Before surgery, you can  play an important role. Because skin is not sterile, your skin needs to be as free of germs as possible. You can reduce the number of germs on your skin by washing with CHG (chlorahexidine gluconate) Soap before surgery.  CHG is an antiseptic cleaner which kills germs and bonds with the skin to continue killing germs even after washing.     Please do not use if you have an allergy to CHG or antibacterial soaps. If your skin becomes reddened/irritated stop using the CHG.  Do not shave  (including legs and underarms) for at least 48 hours prior to first CHG shower. It is OK to shave your face.  Please follow these instructions carefully.     Shower the NIGHT BEFORE SURGERY and the MORNING OF SURGERY with CHG Soap.   If you chose to wash your hair, wash your hair first as usual with your normal shampoo. After you shampoo, rinse your hair and body thoroughly to remove the shampoo.  Then [employer] and genitals (private parts) with your normal soap and rinse thoroughly to remove soap.  After that Use CHG Soap as you would any other liquid soap. You can apply CHG directly to the skin and wash gently with a scrungie or a clean washcloth.   Apply the CHG Soap to your body ONLY FROM THE NECK DOWN.  Do not use on open wounds or open sores. Avoid contact with your eyes, ears, mouth and genitals (private parts). Wash Face and genitals (private parts)  with your normal soap.   Wash thoroughly, paying special attention to the area where your surgery will be performed.  Thoroughly rinse your body with warm water from the neck down.  DO NOT shower/wash with your normal soap after using and rinsing off the CHG Soap.  Pat yourself dry with a CLEAN TOWEL.  Wear CLEAN PAJAMAS to bed the night before surgery  Place CLEAN SHEETS on your bed the night before your surgery  DO NOT SLEEP WITH PETS.   Day of Surgery:  Take a shower with CHG soap. Wear Clean/Comfortable clothing the morning of surgery Do not apply any deodorants/lotions.   Remember to brush your teeth WITH YOUR REGULAR TOOTHPASTE.    If you received a COVID test during your pre-op visit, it is requested that you wear a mask when out in public, stay away from anyone that may not be feeling well, and notify your surgeon if you develop symptoms. If you have been in contact with anyone that has tested positive in the last 10 days, please notify your surgeon.    Please read over the following fact sheets that you were  given.

## 2022-03-27 NOTE — Progress Notes (Addendum)
PCP - Dr. Leota Sauers Cardiologist - denies  PPM/ICD - denies   Chest x-ray - 02/14/20 EKG - 03/27/22 Stress Test - denies ECHO - 01/02/17 Cardiac Cath - denies  Sleep Study - denies   DM- denies  Last dose of GLP1 agonist-  n/a   ASA/Blood Thinner Instructions: n/a   ERAS Protcol - yes, no drink   COVID TEST- n/a   Anesthesia review: no  Patient denies shortness of breath, fever, cough and chest pain at PAT appointment   All instructions explained to the patient, with a verbal understanding of the material. Patient agrees to go over the instructions while at home for a better understanding. The opportunity to ask questions was provided.   Spoke with Clinic RN at Dr. Sheila Oats office. CBC and CMP done on 10/14. Records requested.

## 2022-04-04 NOTE — H&P (Signed)
Caroline Harvey is an 61 y.o. female.    Chief Complaint:  Chronic sinusitis   HPI: Patient presents today for planned elective procedure.  He/she denies any interval change in history since office visit on 01/23/22  Past Medical History:  Diagnosis Date   Alcohol abuse    Has not had a drink 12/09/2019   Anemia    Anxiety    Asthma    GERD (gastroesophageal reflux disease)    Hypertension    Hyponatremia 09/2014    Past Surgical History:  Procedure Laterality Date   ANKLE SURGERY Right    x  2 (achilles tendon repair, and then broken ankle)    Family History  Problem Relation Age of Onset   Hypertension Mother    Colon cancer Maternal Aunt    Breast cancer Paternal Aunt        ? age of onset   Breast cancer Paternal 16        ? age of onset   Stomach cancer Maternal Grandmother    Esophageal cancer Neg Hx    Rectal cancer Neg Hx     Social History:  reports that she has never smoked. She has never used smokeless tobacco. She reports that she does not currently use alcohol. She reports that she does not use drugs.  Allergies: No Known Allergies  Medications Prior to Admission  Medication Sig Dispense Refill   albuterol (VENTOLIN HFA) 108 (90 Base) MCG/ACT inhaler Inhale 2 puffs into the lungs every 4 (four) hours as needed for wheezing or shortness of breath.     gabapentin (NEURONTIN) 300 MG capsule Take 300 mg by mouth 3 (three) times daily.     lisinopril (ZESTRIL) 20 MG tablet Take 1 tablet (20 mg total) by mouth daily. 30 tablet 1   metoCLOPramide (REGLAN) 5 MG tablet Take 5 mg by mouth 4 (four) times daily -  before meals and at bedtime.     naproxen sodium (ALEVE) 220 MG tablet Take 220 mg by mouth 2 (two) times daily as needed (pain).     pantoprazole (PROTONIX) 40 MG tablet Take 40 mg by mouth daily.     SYMBICORT 160-4.5 MCG/ACT inhaler Inhale 2 puffs into the lungs 2 (two) times daily.      No results found for this or any previous visit (from the past  48 hour(s)). No results found.  ROS: negative other than stated in HPI  Last menstrual period 12/14/2010.  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 Proceed wit bilateral complete FESS with CT guided navigation.  Informed consent obtained. R/B/A discussed including risks of  pain, bleeding, infection, injury to the eye with associated vision changes, vision loss, injury to the brain with associated CSF leak or meningitis, failure to improve or worsening of sense of smell, nasal synechiae, nasal septal perforation, need for further procedures, risks of general anesthesia.      Electronically signed by:  Jenetta Downer, MD  Staff Physician [facility] Otolaryngology - Head and Neck Surgery [facility], [place]  04/05/2022, 7:13 AM

## 2022-04-05 ENCOUNTER — Ambulatory Visit (HOSPITAL_COMMUNITY)
Admission: RE | Admit: 2022-04-05 | Discharge: 2022-04-05 | Disposition: A | Discharge status: Home / Self Care | Payer: 59 | Attending: Otolaryngology | Admitting: Otolaryngology

## 2022-04-05 ENCOUNTER — Ambulatory Visit (HOSPITAL_COMMUNITY): Payer: 59 | Admitting: Vascular Surgery

## 2022-04-05 ENCOUNTER — Other Ambulatory Visit: Payer: Self-pay

## 2022-04-05 ENCOUNTER — Encounter (HOSPITAL_COMMUNITY)
Admission: RE | Disposition: A | Discharge status: Home / Self Care | Payer: Self-pay | Source: Home / Self Care | Attending: Otolaryngology

## 2022-04-05 ENCOUNTER — Ambulatory Visit (HOSPITAL_BASED_OUTPATIENT_CLINIC_OR_DEPARTMENT_OTHER): Payer: 59 | Admitting: Anesthesiology

## 2022-04-05 DIAGNOSIS — J3489 Other specified disorders of nose and nasal sinuses: Secondary | ICD-10-CM

## 2022-04-05 DIAGNOSIS — D721 Eosinophilia, unspecified: Secondary | ICD-10-CM | POA: Insufficient documentation

## 2022-04-05 DIAGNOSIS — I10 Essential (primary) hypertension: Secondary | ICD-10-CM

## 2022-04-05 DIAGNOSIS — J324 Chronic pansinusitis: Secondary | ICD-10-CM

## 2022-04-05 DIAGNOSIS — K219 Gastro-esophageal reflux disease without esophagitis: Secondary | ICD-10-CM | POA: Insufficient documentation

## 2022-04-05 DIAGNOSIS — F418 Other specified anxiety disorders: Secondary | ICD-10-CM

## 2022-04-05 DIAGNOSIS — D649 Anemia, unspecified: Secondary | ICD-10-CM | POA: Insufficient documentation

## 2022-04-05 DIAGNOSIS — J45909 Unspecified asthma, uncomplicated: Secondary | ICD-10-CM | POA: Insufficient documentation

## 2022-04-05 DIAGNOSIS — D759 Disease of blood and blood-forming organs, unspecified: Secondary | ICD-10-CM | POA: Diagnosis not present

## 2022-04-05 HISTORY — PX: SINUS ENDO WITH FUSION: SHX5329

## 2022-04-05 SURGERY — SURGERY, PARANASAL SINUS, ENDOSCOPIC, WITH NASAL SEPTOPLASTY, TURBINOPLASTY, AND MAXILLARY SINUSOTOMY
Anesthesia: General | Site: Nose | Laterality: Bilateral

## 2022-04-05 MED ORDER — BUDESONIDE 0.5 MG/2ML IN SUSP: RESPIRATORY_TRACT | 2 refills | Status: AC

## 2022-04-05 MED ORDER — LACTATED RINGERS IV SOLN: INTRAVENOUS | Status: DC

## 2022-04-05 MED ORDER — FLUORESCEIN SODIUM 1 MG OP STRP
ORAL_STRIP | OPHTHALMIC | Status: AC
  Filled 2022-04-05: qty 1

## 2022-04-05 MED ORDER — MIDAZOLAM HCL 2 MG/2ML IJ SOLN
INTRAMUSCULAR | Status: DC | PRN
  Administered 2022-04-05: 2 mg via INTRAVENOUS

## 2022-04-05 MED ORDER — PROPOFOL 10 MG/ML IV BOLUS
INTRAVENOUS | Status: DC | PRN
  Administered 2022-04-05: 50 mg via INTRAVENOUS
  Administered 2022-04-05: 20 mg via INTRAVENOUS
  Administered 2022-04-05: 180 mg via INTRAVENOUS
  Administered 2022-04-05: 50 mg via INTRAVENOUS

## 2022-04-05 MED ORDER — SODIUM CHLORIDE 0.9 % IR SOLN
Status: DC | PRN
  Administered 2022-04-05 (×2): 1000 mL

## 2022-04-05 MED ORDER — CHLORHEXIDINE GLUCONATE 0.12 % MT SOLN
15.0000 mL | Freq: Once | OROMUCOSAL | Status: AC
  Administered 2022-04-05: 15 mL via OROMUCOSAL
  Filled 2022-04-05: qty 15

## 2022-04-05 MED ORDER — FENTANYL CITRATE (PF) 100 MCG/2ML IJ SOLN
INTRAMUSCULAR | Status: AC
  Filled 2022-04-05: qty 2

## 2022-04-05 MED ORDER — HYDROMORPHONE HCL 1 MG/ML IJ SOLN
INTRAMUSCULAR | Status: DC | PRN
  Administered 2022-04-05 (×2): .5 mg via INTRAVENOUS

## 2022-04-05 MED ORDER — PROPOFOL 10 MG/ML IV BOLUS
INTRAVENOUS | Status: AC
  Filled 2022-04-05: qty 20

## 2022-04-05 MED ORDER — EPINEPHRINE HCL (NASAL) 0.1 % NA SOLN
NASAL | Status: DC | PRN
  Administered 2022-04-05: 10 mL via NASAL
  Administered 2022-04-05: 10 mL via TOPICAL

## 2022-04-05 MED ORDER — ORAL CARE MOUTH RINSE: 15.0000 mL | Freq: Once | OROMUCOSAL | Status: AC

## 2022-04-05 MED ORDER — SUGAMMADEX SODIUM 200 MG/2ML IV SOLN
INTRAVENOUS | Status: DC | PRN
  Administered 2022-04-05: 200 mg via INTRAVENOUS

## 2022-04-05 MED ORDER — LABETALOL HCL 5 MG/ML IV SOLN
INTRAVENOUS | Status: DC | PRN
  Administered 2022-04-05: 10 mg via INTRAVENOUS
  Administered 2022-04-05 (×2): 5 mg via INTRAVENOUS

## 2022-04-05 MED ORDER — HYDROMORPHONE HCL 1 MG/ML IJ SOLN
INTRAMUSCULAR | Status: AC
  Filled 2022-04-05: qty 0.5

## 2022-04-05 MED ORDER — LABETALOL HCL 5 MG/ML IV SOLN
INTRAVENOUS | Status: AC
  Filled 2022-04-05: qty 4

## 2022-04-05 MED ORDER — DEXAMETHASONE SODIUM PHOSPHATE 10 MG/ML IJ SOLN
INTRAMUSCULAR | Status: DC | PRN
  Administered 2022-04-05: 10 mg via INTRAVENOUS

## 2022-04-05 MED ORDER — LIDOCAINE-EPINEPHRINE 1 %-1:100000 IJ SOLN
INTRAMUSCULAR | Status: DC | PRN
  Administered 2022-04-05: 4 mL

## 2022-04-05 MED ORDER — PROMETHAZINE HCL 25 MG/ML IJ SOLN: 6.2500 mg | INTRAMUSCULAR | Status: DC | PRN

## 2022-04-05 MED ORDER — ROCURONIUM BROMIDE 10 MG/ML (PF) SYRINGE
PREFILLED_SYRINGE | INTRAVENOUS | Status: DC | PRN
  Administered 2022-04-05: 20 mg via INTRAVENOUS
  Administered 2022-04-05: 50 mg via INTRAVENOUS
  Administered 2022-04-05: 20 mg via INTRAVENOUS

## 2022-04-05 MED ORDER — ONDANSETRON HCL 4 MG/2ML IJ SOLN
INTRAMUSCULAR | Status: DC | PRN
  Administered 2022-04-05: 4 mg via INTRAVENOUS

## 2022-04-05 MED ORDER — FENTANYL CITRATE (PF) 100 MCG/2ML IJ SOLN
25.0000 ug | INTRAMUSCULAR | Status: DC | PRN
  Administered 2022-04-05: 50 ug via INTRAVENOUS

## 2022-04-05 MED ORDER — ACETAMINOPHEN 500 MG PO TABS
1000.0000 mg | ORAL_TABLET | Freq: Once | ORAL | Status: AC
  Administered 2022-04-05: 1000 mg via ORAL
  Filled 2022-04-05: qty 2

## 2022-04-05 MED ORDER — FENTANYL CITRATE (PF) 250 MCG/5ML IJ SOLN
INTRAMUSCULAR | Status: AC
  Filled 2022-04-05: qty 5

## 2022-04-05 MED ORDER — MIDAZOLAM HCL 2 MG/2ML IJ SOLN
INTRAMUSCULAR | Status: AC
  Filled 2022-04-05: qty 2

## 2022-04-05 MED ORDER — DEXMEDETOMIDINE HCL IN NACL 80 MCG/20ML IV SOLN
INTRAVENOUS | Status: DC | PRN
  Administered 2022-04-05 (×2): 4 ug via BUCCAL

## 2022-04-05 MED ORDER — FENTANYL CITRATE (PF) 250 MCG/5ML IJ SOLN
INTRAMUSCULAR | Status: DC | PRN
  Administered 2022-04-05: 50 ug via INTRAVENOUS
  Administered 2022-04-05 (×2): 100 ug via INTRAVENOUS

## 2022-04-05 MED ORDER — LIDOCAINE 2% (20 MG/ML) 5 ML SYRINGE: INTRAMUSCULAR | Status: DC | PRN

## 2022-04-05 MED ORDER — FLUORESCEIN SODIUM 1 MG OP STRP
ORAL_STRIP | OPHTHALMIC | Status: DC | PRN
  Administered 2022-04-05 (×2): 1

## 2022-04-05 MED ORDER — OXYCODONE HCL 5 MG PO CAPS: 5.0000 mg | ORAL_CAPSULE | Freq: Four times a day (QID) | ORAL | 0 refills | Status: AC | PRN

## 2022-04-05 MED ORDER — LIDOCAINE 2% (20 MG/ML) 5 ML SYRINGE
INTRAMUSCULAR | Status: DC | PRN
  Administered 2022-04-05: 60 mg via INTRAVENOUS

## 2022-04-05 MED ORDER — MUPIROCIN 2 % EX OINT
TOPICAL_OINTMENT | CUTANEOUS | Status: AC
  Filled 2022-04-05: qty 22

## 2022-04-05 MED ORDER — LIDOCAINE-EPINEPHRINE 1 %-1:100000 IJ SOLN
INTRAMUSCULAR | Status: AC
  Filled 2022-04-05: qty 1

## 2022-04-05 MED ORDER — ACETAMINOPHEN 500 MG PO TABS: 500.0000 mg | ORAL_TABLET | Freq: Four times a day (QID) | ORAL | 0 refills | Status: AC | PRN

## 2022-04-05 MED ORDER — PROPOFOL 500 MG/50ML IV EMUL
INTRAVENOUS | Status: DC | PRN
  Administered 2022-04-05: 100 ug/kg/min via INTRAVENOUS

## 2022-04-05 MED ORDER — OXYCODONE HCL 5 MG/5ML PO SOLN: 5.0000 mg | Freq: Once | ORAL | Status: DC | PRN

## 2022-04-05 MED ORDER — EPINEPHRINE HCL (NASAL) 0.1 % NA SOLN
NASAL | Status: AC
  Filled 2022-04-05: qty 30

## 2022-04-05 MED ORDER — OXYCODONE HCL 5 MG PO TABS: 5.0000 mg | ORAL_TABLET | Freq: Once | ORAL | Status: DC | PRN

## 2022-04-05 SURGICAL SUPPLY — 50 items
ATTRACTOMAT 16X20 MAGNETIC DRP (DRAPES) IMPLANT
BAG COUNTER SPONGE SURGICOUNT (BAG) ×2 IMPLANT
BAG SPNG CNTER NS LX DISP (BAG) ×1
BLADE RAD60 ROTATE M4 4 5PK (BLADE) IMPLANT
BLADE ROTATE RAD 40 4 M4 (BLADE) IMPLANT
BLADE ROTATE TRICUT 4X13 M4 (BLADE) ×2 IMPLANT
BLADE SURG 15 STRL LF DISP TIS (BLADE) IMPLANT
BLADE SURG 15 STRL SS (BLADE)
BUR TAPER CHOANAL ATRESIA 30K (BURR) IMPLANT
CANISTER SUCT 3000ML PPV (MISCELLANEOUS) ×4 IMPLANT
COAGULATOR SUCT 8FR VV (MISCELLANEOUS) IMPLANT
COAGULATOR SUCT SWTCH 10FR 6 (ELECTROSURGICAL) IMPLANT
DEFOGGER ANTIFOG KIT (MISCELLANEOUS) ×2 IMPLANT
DRAPE HALF SHEET 40X57 (DRAPES) IMPLANT
DRESSING NASAL KENNEDY 3.5X.9 (MISCELLANEOUS) IMPLANT
DRSG NASAL KENNEDY 3.5X.9 (MISCELLANEOUS)
ELECT COATED BLADE 2.86 ST (ELECTRODE) IMPLANT
ELECT REM PT RETURN 9FT ADLT (ELECTROSURGICAL) ×1
ELECTRODE REM PT RTRN 9FT ADLT (ELECTROSURGICAL) ×2 IMPLANT
FILTER ARTHROSCOPY CONVERTOR (FILTER) IMPLANT
GLOVE BIO SURGEON STRL SZ7.5 (GLOVE) ×2 IMPLANT
GLOVE BIOGEL PI IND STRL 8 (GLOVE) ×2 IMPLANT
GOWN STRL REUS W/ TWL LRG LVL3 (GOWN DISPOSABLE) ×2 IMPLANT
GOWN STRL REUS W/ TWL XL LVL3 (GOWN DISPOSABLE) ×2 IMPLANT
GOWN STRL REUS W/TWL LRG LVL3 (GOWN DISPOSABLE) ×1
GOWN STRL REUS W/TWL XL LVL3 (GOWN DISPOSABLE) ×1
KIT BASIN OR (CUSTOM PROCEDURE TRAY) ×2 IMPLANT
KIT TURNOVER KIT B (KITS) ×2 IMPLANT
NDL SPNL 25GX3.5 QUINCKE BL (NEEDLE) ×2 IMPLANT
NEEDLE SPNL 25GX3.5 QUINCKE BL (NEEDLE) ×1 IMPLANT
NS IRRIG 1000ML POUR BTL (IV SOLUTION) ×2 IMPLANT
PAD ARMBOARD 7.5X6 YLW CONV (MISCELLANEOUS) ×4 IMPLANT
PATTIES SURGICAL .5 X3 (DISPOSABLE) ×2 IMPLANT
PENCIL SMOKE EVACUATOR (MISCELLANEOUS) IMPLANT
SHEATH ENDOSCRUB 0 DEG (SHEATH) ×2 IMPLANT
SHEATH ENDOSCRUB 30 DEG (SHEATH) IMPLANT
SPECIMEN JAR SMALL (MISCELLANEOUS) IMPLANT
SPLINT NASAL POSISEP X2 .8X2.3 (GAUZE/BANDAGES/DRESSINGS) IMPLANT
SUT ETHILON 3 0 FSL (SUTURE) IMPLANT
SUT PLAIN 4 0 ~~LOC~~ 1 (SUTURE) IMPLANT
SWAB COLLECTION DEVICE MRSA (MISCELLANEOUS) IMPLANT
SWAB CULTURE ESWAB REG 1ML (MISCELLANEOUS) IMPLANT
SYR 30ML LL (SYRINGE) ×2 IMPLANT
SYR CONTROL 10ML LL (SYRINGE) IMPLANT
TOWEL GREEN STERILE FF (TOWEL DISPOSABLE) ×2 IMPLANT
TRACKER ENT INSTRUMENT (MISCELLANEOUS) ×2 IMPLANT
TRACKER ENT PATIENT (MISCELLANEOUS) ×2 IMPLANT
TRAY ENT MC OR (CUSTOM PROCEDURE TRAY) ×2 IMPLANT
TUBE CONNECTING 12X1/4 (SUCTIONS) ×2 IMPLANT
TUBING STRAIGHTSHOT EPS 5PK (TUBING) ×2 IMPLANT

## 2022-04-05 NOTE — Op Note (Signed)
OPERATIVE NOTE  Magda Muise Date/Time of Admission: 04/05/2022  6:55 AM  CSN: [id number];OIZ:[id number] Attending Provider: Jenetta Downer, MD Room/Bed: MCPO/NONE DOB: 25-Jan-1961 Age: 61 y.o.   Pre-Op Diagnosis: Chronic pansinusitis; Nasal obstruction; Dysosmia; Hyposmia  Post-Op Diagnosis: Chronic pansinusitis; Nasal obstruction; Dysosmia; Hyposmia  Procedure:  Bilateral maxillary antrostomy with tissue removal 31267 modifier 50 Bilateral total ethmoidectomy 31255 modifier 50 Bilateral sphenoidotomy with tissue removal 31288 modifier 50 Bilateral frontal sinusotomy with removal of tissue 31276 modifier 50 Stereotactic CT guided navigation (extradural) 33825  Anesthesia: General  Surgeon(s): Pamala Hurry, MD  Staff: Circulator: Rozell Searing, RN; Noreene Larsson, Filbert Berthold, RN Relief Scrub: Lyndle Herrlich, CST Scrub Person: Rolan Bucco Circulator Assistant: Darylene Price, RN  Implants: * No implants in log *  Specimens: ID Type Source Tests Collected by Time Destination  1 : Left nasal sinus contents Tissue PATH ENT excision SURGICAL PATHOLOGY Jenetta Downer, MD 04/05/2022 1020   2 : Right nasal sinus contents Tissue PATH ENT excision SURGICAL PATHOLOGY Jenetta Downer, MD 04/05/2022 1024   3 : Right Side Allergic Mucin Tissue PATH ENT biopsy SURGICAL PATHOLOGY Jenetta Downer, MD 05/39/7673 4193     Complications: none  EBL: 100 ML  IVF: See anesthesia report ML  Condition: stable  Operative Findings:  Bilateral mucosal edema and 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 She presents today for definitive surgical management.   Informed consent: Informed consent was  obtained. Risks discussed in detail including pain, bleeding, infection, injury to the eye with associated vision changes, vision loss, injury to the brain with associated CSF leak or meningitis, failure to improve or worsening of sense of smell, nasal synechiae, nasal septal perforation, need for further procedures, risks of general anesthesia. Despite these risks the patient requested to proceed with surgery.  Details of the Procedure:  The patient was identified in the pre-op area and brought back to the operating room and laid in the supine position. General anesthesia (TIVA) was induced and the patient was endotracheally intubated without complication. A surgical pause was then performed to identify the correct patient, procedure, and location. After all were in agreement, the head of the bed was maintaned heads towards anesthesia.  The patient was prepped and draped in the standard clean fashion for endoscopic sinus surgery. The nose was decongested with 1:1000 topical epinephrine.  We began by setting up the computer aided CT stereotactic navigation. The reference array was placed about the patients forehead and calibrated to known anatomic landmarks. It was used throughout the procedure as indicated given the chronic disease process and need for dissection along the skull base and orbit.   All cottonoids were removed and set aside. The 0, 30, degree endoscopes were attached to the video monitor for endoscopic viewing as needed throughout the procedure, and our attention was directed to the right nasal cavity. Approximately 6cc of 1% with 1:100k were injected into the attachment of the middle turbinate as well as the sphenopalatine artery location. The middle turbinate was then deflected medially by a freer as a cottonoid soaked with 1:1000 epinephrine was inserted into the middle meatus. After this had time to take effect, the backbiter was then used to take down the uncinate, and the ball tipped  probe  was used to enter the maxillary sinus. The maxillary sinus ostium was widened anteriorly using the back-biter, and the edges were trimmed using thru-cut forceps and microdebrider. The 30 degree endoscope was used to confirm that the natural os was included in the antrostomy. The maxillary sinus was then widened to its anatomic limits (posterior maxillary wall, floor of the orbit, nasolacrimal duct, and concha of the inferior turbinate. It was hypoplastic. Allergic mucin was irrigated and removed from the sinus. The J curette was then used to enter the ethmoid bulla at an anterior inferior position and the anterior ethmoid tissue taken down with a combination of a currette and the microdebrider. The basal lamella was identified and entered thereby identifying the superior turbinate. A osteitic wall of bone in the ethmoids was carefully burred down to allow for further dissection with a choanal atresia bur. No CSF leak occurred. All sinus disease was cleared out and the ant/post ethmoids and maxillary sinus irrigated with a copious amount of NS.   We then turned our attention to performing the right sphenoidotomy. The 0-degree endoscope was used to identify the superior turbinate, and the inferior 1/3 was taken down with thru cutting instrumentation. The true sphenoid ostium was cannulated and widened with a Kerrison rongeur and straight mushroom punch. All debris was removed from the sphenoid sinus.  We then proceeded with our posterior to anterior dissection. A 30 degree endoscope was used for visualization and through cutting instrumentation used to take down bony lamella along the skull base of the remaining posterior and anterior ethmoid air cells. The anterior ethmoid artery location was identified and preserved.   We then turned our attention to the right frontal sinus. The superior articulation of the uncinate was taken down with the hoseman frontal punch. The posterior wall of the agger nasi was  removed with a front to back giraffe. The roof of the agger nasi was taken hoseman punch.The right frontal sinus was then cannulated and maximized with the cobra and Hoseman frontal mushroom punch. There were tiered type 2 frontal cells preventing complete access to the frontal sinus. The tracking probe was passed through the narrow space between the frontal sinus limits and the tiered cells.   We then turned our attention to the left side. The middle turbinate was then deflected medially by a freer as a cottonoid soaked with 1:1000 epinephrine was inserted into the middle meatus. After this had time to take effect, the backbiter was then used to take down the uncinate, and the ball tipped probe was used to enter the maxillary sinus. The maxillary sinus ostium was widened anteriorly using the back-biter, and the edges were trimmed using thru-cut forceps and microdebrider. The 30 degree endoscope was used to confirm that the natural os was included in the antrostomy. The maxillary sinus was then widened to its anatomic limits (posterior maxillary wall, floor of the orbit, nasolacrimal duct, and concha of the inferior turbinate. The sinus itself was hypoplastic and allergic mucin was removed/debrided. The J curette was then used to enter the ethmoid bulla at an anterior inferior position and the anterior ethmoid tissue taken down with a combination of a currette and the microdebrider. The basal lamella was identified and entered thereby identifying the superior turbinate. All sinus disease was cleared out and the ant/post ethmoids and maxillary sinus irrigated with a copious amount of NS.   We then turned our attention to performing the left sphenoidotomy. The 0-degree endoscope was used to identify the superior turbinate, and  the inferior 1/3 was taken down with thru cutting instrumentation. The true sphenoid ostium was cannulated and widened with a kerrison ronguer and straight mushroom punch. All debris was  removed from the sphenoid sinus.  We then proceeded with our posterior to anterior dissection. A 30 degree endoscope was used for visualization and through cutting instrumentation used to take down bony lamella along the skull base of the remaining posterior and anterior ethmoid air cells. The anterior ethmoid artery location was identified and preserved.   We then turned our attention to the left frontal sinus. The superior articulation of the uncinate was taken down with the hoseman frontal punch. The posterior wall of the agger nasi was removed with a front to back giraffe. The roof of the agger nasi was taken hoseman punch.The right frontal sinus was then cannulated and maximized with the cobra and Hoseman frontal mushroom punch. There were tiered type 2 frontal cells preventing complete access to the frontal sinus. The tracking probe was passed through the narrow space between the frontal sinus limits and the tiered cells.   All cottonoids were removed and hemostasis confirmed. The nasopharynx and oropharynx were suctioned free of blood and saline. Bilateral chitosan dressings were placed in the middle meati.. A mustache dressing was then applied. The patient was then returned to the anesthetist, who awakened and extubated the patient without incident. The patient tolerated the procedure well without complications and was transferred to the recovery room in satisfactory condition.  Pamala Hurry, MD [facility]  04/05/2022

## 2022-04-05 NOTE — Discharge Instructions (Signed)
Endoscopic Sinus Surgery: Postoperative Care Instructions  Your active participation in the postoperative phase of treatment is critical to the success of your surgery. Please read and follow the instructions below.  Bleeding: It is normal to experience some nasal bleeding the afternoon/evening of surgery. If bleeding occurs, lean forward slightly and gently pinch the bottom 2/3 of your nose between your thumb and forefinger. Hold this for approximately 10 minutes. If you are still experiencing bleeding, you may apply Afrin (other options include neo-synephrine, 4 way nasal spray, etc) to your nose to shrink the blood vessels. Spray 3 puffs to the affected nostril(s) and then gently pinch the nose for 10 minutes. If this does not relieve the bleeding or if the bleeding is more significant, please call the hospital operator and ask for the ENT doctor on call. If extensive bleeding occurs, please call 911 or be seen at your closest emergency department. Medications:  Pain medication: The only mediation you will need to take the night of surgery is pain medication. If you wish a non-narcotic alternative, extra-strength Tylenol and ibuprofen are often sufficient.  Antibiotics/oral steroids: If you are prescribed either antibiotic or oral steroids, start these the day after surgery.   Nasal steroid spray: Start (or restart) your nasal steroid spray one week after surgery.   Aspirin: DO NOT take aspirin for at least three days after the surgery. Saline:  Moisture is critical to proper healing. You will need to moisturize and rinse your nose for at least 6 weeks after surgery. Nasal Mist: The day after surgery, start spraying your nose with saline mist spray. Apply 2-3 sprays to each nostril every one to two hours throughout the day. Common brands include: Ayr, Ocean, or Simply Saline. They are available over-the-counter at most pharmacies.  Nasal Irrigations: The day after surgery, begin sinus rinses twice a  day using the "NeilMed Sinus Rinse Kit"  (available for purchase at many pharmacies). Use gentle pressure when irrigating. If you have any discomfort with irrigation, reduce the amount of pressure you are applying or discontinue irrigations until the first post-operative visit.  Post-operative care: Do not blow your nose for the first week after surgery. You may sniff back. If you need to sneeze, do not suppress it but instead sneeze with your mouth open. After 1 week, you may blow your nose gently. No strenuous activity for one week after surgery. No straining or lifting more than 15 lbs.  You should not bend over at the waist to pick things up. Instead bend at the knees, with your head up. Light walking and normal household activities are acceptable immediately after surgery. You may resume exercise at 50% intensity after one week, and full intensity at two weeks. You may drive the day after surgery if you are not requiring narcotic pain medication. If you are taking antibiotics and experience stomach upset, active culture yogurt (Nancy's yogurt) or lactobacillus tablets (available at health food stores) on a daily basis may help.  If you develop diarrhea, stop your antibiotics and contact our office.  Persistent diarrhea may require further medical evaluation. You may eat a regular diet. You should plan on taking one week off from work and ideally have a half-day planned for your first day back. Do not fly without your doctor's clearance for 7 days after surgery. Post-operative visits: You will have frequent return visits to our office after surgery.  At each visit, a procedure called nasal endoscopy will be performed. The sinus cavities may be cleaned (  termed "debridement") in order to assure appropriate healing of the sinuses. Visits typically occur 1, 3, and 6 weeks after surgery. We recommend taking a dose of pain medication about 45 minutes to one hour before your first postoperative visit (as  long as someone can accompany you to your visit).  Post-operative visits are usually scheduled at the time of surgery scheduling, however if you have any questions about your post-operative visit you can contact our clinic at [phone]. Call our office if you experience any of the following: Fever higher than 101?F Clear, watery nasal drainage Any visual changes or marked swelling of the eyes Severe headache or neck stiffness Severe diarrhea Brisk bleeding  If you need to speak to a doctor after hours, please call the [facility] at [phone] and ask for the ENT doctor on call, or call 911 for emergencies.  

## 2022-04-05 NOTE — Anesthesia Postprocedure Evaluation (Addendum)
Anesthesia Post Note  Patient: Caroline Harvey  Procedure(s) Performed: FUNCTIONAL ENDOSCOPIC SINUS SURGERY OF THE FRONTAL, ETHMOID, MAXILLARY AND SPHENOID SINUSES WITH FUSION NAVIGATION (Bilateral: Nose)     Patient location during evaluation: PACU Anesthesia Type: General Level of consciousness: awake and alert Pain management: pain level controlled Vital Signs Assessment: post-procedure vital signs reviewed and stable Respiratory status: spontaneous breathing, nonlabored ventilation and respiratory function stable Cardiovascular status: stable and blood pressure returned to baseline Anesthetic complications: no   No notable events documented.  Last Vitals:  Vitals:   04/05/22 1255 04/05/22 1310  BP: (!) 159/77 135/72  Pulse: 64 63  Resp: 19 14  Temp:  36.9 C  SpO2: 96% 93%    Last Pain:  Vitals:   04/05/22 1310  TempSrc:   PainSc: Okay

## 2022-04-05 NOTE — Transfer of Care (Signed)
Immediate Anesthesia Transfer of Care Note  Patient: Caroline Harvey  Procedure(s) Performed: FUNCTIONAL ENDOSCOPIC SINUS SURGERY OF THE FRONTAL, ETHMOID, MAXILLARY AND SPHENOID SINUSES WITH FUSION NAVIGATION (Bilateral: Nose)  Patient Location: PACU  Anesthesia Type:General  Level of Consciousness: awake and alert   Airway & Oxygen Therapy: Patient Spontanous Breathing  Post-op Assessment: Report given to RN and Post -op Vital signs reviewed and stable  Post vital signs: Reviewed and stable  Last Vitals:  Vitals Value Taken Time  BP 135/69 04/05/22 1241  Temp    Pulse 77 04/05/22 1244  Resp 15 04/05/22 1244  SpO2 95 % 04/05/22 1244  Vitals shown include unvalidated device data.  Last Pain:  Vitals:   04/05/22 0803  TempSrc:   PainSc: 0-No pain         Complications: No notable events documented.

## 2022-04-05 NOTE — Anesthesia Preprocedure Evaluation (Addendum)
Anesthesia Evaluation  Patient identified by MRN, date of birth, ID band Patient awake    Reviewed: Allergy & Precautions, NPO status , Patient's Chart, lab work & pertinent test results  History of Anesthesia Complications Negative for: history of anesthetic complications  Airway Mallampati: II  TM Distance: >3 FB Neck ROM: Full    Dental  (+) Dental Advisory Given, Chipped   Pulmonary asthma    Pulmonary exam normal        Cardiovascular hypertension, Pt. on medications Normal cardiovascular exam     Neuro/Psych  PSYCHIATRIC DISORDERS Anxiety Depression    negative neurological ROS     GI/Hepatic ,GERD  Medicated and Controlled,,(+)     substance abuse (Denies cocaine (+ tests in chart in past). Last alcohol 27 mo ago per patient)  alcohol use and cocaine use  Endo/Other  negative endocrine ROS    Renal/GU negative Renal ROS     Musculoskeletal negative musculoskeletal ROS (+)    Abdominal   Peds  Hematology  (+) Blood dyscrasia, anemia   Anesthesia Other Findings   Reproductive/Obstetrics                             Anesthesia Physical Anesthesia Plan  ASA: 3  Anesthesia Plan: General   Post-op Pain Management: Tylenol PO (pre-op)*   Induction: Intravenous  PONV Risk Score and Plan: 3 and Treatment may vary due to age or medical condition, Ondansetron, Midazolam and TIVA  Airway Management Planned: Oral ETT  Additional Equipment: None  Intra-op Plan:   Post-operative Plan: Extubation in OR  Informed Consent: I have reviewed the patients History and Physical, chart, labs and discussed the procedure including the risks, benefits and alternatives for the proposed anesthesia with the patient or authorized representative who has indicated his/her understanding and acceptance.     Dental advisory given  Plan Discussed with: CRNA, Anesthesiologist and  Surgeon  Anesthesia Plan Comments:        Anesthesia Quick Evaluation

## 2022-04-05 NOTE — Anesthesia Procedure Notes (Signed)
Procedure Name: Intubation Date/Time: 04/05/2022 9:55 AM  Performed by: Lind Guest, CRNAPre-anesthesia Checklist: Patient identified, Emergency Drugs available, Suction available, Patient being monitored and Timeout performed Patient Re-evaluated:Patient Re-evaluated prior to induction Oxygen Delivery Method: Circle system utilized Preoxygenation: Pre-oxygenation with 100% oxygen Induction Type: IV induction Ventilation: Mask ventilation without difficulty Laryngoscope Size: Mac and 4 Grade View: Grade I Tube type: Oral Rae Tube size: 7.0 mm Number of attempts: 1 Placement Confirmation: ETT inserted through vocal cords under direct vision, positive ETCO2 and breath sounds checked- equal and bilateral Secured at: 23 cm Tube secured with: Tape Dental Injury: Teeth and Oropharynx as per pre-operative assessment

## 2022-04-05 NOTE — Progress Notes (Signed)
Wasted Fentanyl 94mg with SVivia Birmingham RN as witness.

## 2022-04-06 ENCOUNTER — Encounter (HOSPITAL_COMMUNITY): Payer: Self-pay | Admitting: Otolaryngology

## 2022-04-08 LAB — SURGICAL PATHOLOGY

## 2022-08-12 ENCOUNTER — Inpatient Hospital Stay (HOSPITAL_COMMUNITY)
Admission: EM | Admit: 2022-08-12 | Discharge: 2022-08-14 | DRG: 193 | Disposition: A | Discharge status: Home / Self Care | Payer: 59 | Attending: Internal Medicine | Admitting: Internal Medicine

## 2022-08-12 ENCOUNTER — Emergency Department (HOSPITAL_COMMUNITY): Payer: 59

## 2022-08-12 ENCOUNTER — Encounter (HOSPITAL_COMMUNITY): Payer: Self-pay | Admitting: Emergency Medicine

## 2022-08-12 ENCOUNTER — Encounter (HOSPITAL_COMMUNITY): Payer: Self-pay | Admitting: Internal Medicine

## 2022-08-12 ENCOUNTER — Ambulatory Visit (HOSPITAL_COMMUNITY)
Admission: EM | Admit: 2022-08-12 | Discharge: 2022-08-12 | Disposition: A | Discharge status: Home / Self Care | Payer: 59 | Attending: Emergency Medicine | Admitting: Emergency Medicine

## 2022-08-12 ENCOUNTER — Other Ambulatory Visit: Payer: Self-pay

## 2022-08-12 DIAGNOSIS — Z6836 Body mass index (BMI) 36.0-36.9, adult: Secondary | ICD-10-CM

## 2022-08-12 DIAGNOSIS — Z800 Family history of malignant neoplasm of digestive organs: Secondary | ICD-10-CM | POA: Diagnosis not present

## 2022-08-12 DIAGNOSIS — N179 Acute kidney failure, unspecified: Secondary | ICD-10-CM | POA: Diagnosis present

## 2022-08-12 DIAGNOSIS — I10 Essential (primary) hypertension: Secondary | ICD-10-CM | POA: Diagnosis present

## 2022-08-12 DIAGNOSIS — E871 Hypo-osmolality and hyponatremia: Secondary | ICD-10-CM | POA: Diagnosis present

## 2022-08-12 DIAGNOSIS — J189 Pneumonia, unspecified organism: Secondary | ICD-10-CM | POA: Diagnosis not present

## 2022-08-12 DIAGNOSIS — J9601 Acute respiratory failure with hypoxia: Secondary | ICD-10-CM | POA: Diagnosis present

## 2022-08-12 DIAGNOSIS — J45901 Unspecified asthma with (acute) exacerbation: Secondary | ICD-10-CM | POA: Diagnosis present

## 2022-08-12 DIAGNOSIS — R079 Chest pain, unspecified: Secondary | ICD-10-CM | POA: Diagnosis not present

## 2022-08-12 DIAGNOSIS — Z803 Family history of malignant neoplasm of breast: Secondary | ICD-10-CM | POA: Diagnosis not present

## 2022-08-12 DIAGNOSIS — R0989 Other specified symptoms and signs involving the circulatory and respiratory systems: Secondary | ICD-10-CM

## 2022-08-12 DIAGNOSIS — F1011 Alcohol abuse, in remission: Secondary | ICD-10-CM | POA: Diagnosis present

## 2022-08-12 DIAGNOSIS — Z881 Allergy status to other antibiotic agents status: Secondary | ICD-10-CM | POA: Diagnosis not present

## 2022-08-12 DIAGNOSIS — Z79899 Other long term (current) drug therapy: Secondary | ICD-10-CM | POA: Diagnosis not present

## 2022-08-12 DIAGNOSIS — M7981 Nontraumatic hematoma of soft tissue: Secondary | ICD-10-CM | POA: Diagnosis present

## 2022-08-12 DIAGNOSIS — D649 Anemia, unspecified: Secondary | ICD-10-CM | POA: Diagnosis present

## 2022-08-12 DIAGNOSIS — I959 Hypotension, unspecified: Secondary | ICD-10-CM

## 2022-08-12 DIAGNOSIS — R1032 Left lower quadrant pain: Secondary | ICD-10-CM | POA: Diagnosis not present

## 2022-08-12 DIAGNOSIS — K219 Gastro-esophageal reflux disease without esophagitis: Secondary | ICD-10-CM | POA: Diagnosis present

## 2022-08-12 DIAGNOSIS — Z8249 Family history of ischemic heart disease and other diseases of the circulatory system: Secondary | ICD-10-CM

## 2022-08-12 DIAGNOSIS — Z888 Allergy status to other drugs, medicaments and biological substances status: Secondary | ICD-10-CM | POA: Diagnosis not present

## 2022-08-12 DIAGNOSIS — E669 Obesity, unspecified: Secondary | ICD-10-CM | POA: Diagnosis present

## 2022-08-12 DIAGNOSIS — R0902 Hypoxemia: Secondary | ICD-10-CM | POA: Diagnosis present

## 2022-08-12 DIAGNOSIS — S301XXA Contusion of abdominal wall, initial encounter: Secondary | ICD-10-CM

## 2022-08-12 DIAGNOSIS — J1008 Influenza due to other identified influenza virus with other specified pneumonia: Secondary | ICD-10-CM | POA: Diagnosis present

## 2022-08-12 DIAGNOSIS — Z7951 Long term (current) use of inhaled steroids: Secondary | ICD-10-CM

## 2022-08-12 DIAGNOSIS — Z1152 Encounter for screening for COVID-19: Secondary | ICD-10-CM | POA: Diagnosis not present

## 2022-08-12 DIAGNOSIS — R7989 Other specified abnormal findings of blood chemistry: Secondary | ICD-10-CM | POA: Diagnosis not present

## 2022-08-12 DIAGNOSIS — J159 Unspecified bacterial pneumonia: Secondary | ICD-10-CM | POA: Diagnosis present

## 2022-08-12 LAB — RESPIRATORY PANEL BY PCR
Adenovirus: NOT DETECTED
Bordetella Parapertussis: NOT DETECTED
Bordetella pertussis: NOT DETECTED
Chlamydophila pneumoniae: NOT DETECTED
Coronavirus 229E: NOT DETECTED
Coronavirus HKU1: NOT DETECTED
Coronavirus NL63: NOT DETECTED
Coronavirus OC43: NOT DETECTED
Influenza A: DETECTED — AB
Influenza B: NOT DETECTED
Metapneumovirus: NOT DETECTED
Mycoplasma pneumoniae: NOT DETECTED
Parainfluenza Virus 1: NOT DETECTED
Parainfluenza Virus 2: NOT DETECTED
Parainfluenza Virus 3: NOT DETECTED
Parainfluenza Virus 4: NOT DETECTED
Respiratory Syncytial Virus: NOT DETECTED
Rhinovirus / Enterovirus: NOT DETECTED

## 2022-08-12 LAB — COMPREHENSIVE METABOLIC PANEL
ALT: 9 U/L (ref 0–44)
AST: 28 U/L (ref 15–41)
Albumin: 3.2 g/dL — ABNORMAL LOW (ref 3.5–5.0)
Alkaline Phosphatase: 65 U/L (ref 38–126)
Anion gap: 10 (ref 5–15)
BUN: 12 mg/dL (ref 6–20)
CO2: 23 mmol/L (ref 22–32)
Calcium: 8.6 mg/dL — ABNORMAL LOW (ref 8.9–10.3)
Chloride: 100 mmol/L (ref 98–111)
Creatinine, Ser: 1.38 mg/dL — ABNORMAL HIGH (ref 0.44–1.00)
GFR, Estimated: 44 mL/min — ABNORMAL LOW (ref >60)
Glucose, Bld: 116 mg/dL — ABNORMAL HIGH (ref 70–99)
Potassium: 3.7 mmol/L (ref 3.5–5.1)
Sodium: 133 mmol/L — ABNORMAL LOW (ref 135–145)
Total Bilirubin: 0.8 mg/dL (ref 0.3–1.2)
Total Protein: 7.4 g/dL (ref 6.5–8.1)

## 2022-08-12 LAB — URINALYSIS, ROUTINE W REFLEX MICROSCOPIC
Bilirubin Urine: NEGATIVE
Glucose, UA: NEGATIVE mg/dL
Ketones, ur: NEGATIVE mg/dL
Leukocytes,Ua: NEGATIVE
Nitrite: NEGATIVE
Protein, ur: 100 mg/dL — AB
Specific Gravity, Urine: >1.046 — ABNORMAL HIGH (ref 1.005–1.030)
pH: 5 (ref 5.0–8.0)

## 2022-08-12 LAB — CBC
HCT: 37.3 % (ref 36.0–46.0)
Hemoglobin: 11.6 g/dL — ABNORMAL LOW (ref 12.0–15.0)
MCH: 27 pg (ref 26.0–34.0)
MCHC: 31.1 g/dL (ref 30.0–36.0)
MCV: 86.9 fL (ref 80.0–100.0)
Platelets: 196 10*3/uL (ref 150–400)
RBC: 4.29 MIL/uL (ref 3.87–5.11)
RDW: 13.6 % (ref 11.5–15.5)
WBC: 9.2 10*3/uL (ref 4.0–10.5)
nRBC: 0 % (ref 0.0–0.2)

## 2022-08-12 LAB — LACTIC ACID, PLASMA
Lactic Acid, Venous: 2.3 mmol/L (ref 0.5–1.9)
Lactic Acid, Venous: 2.3 mmol/L (ref 0.5–1.9)

## 2022-08-12 LAB — RETICULOCYTES
Immature Retic Fract: 6.4 % (ref 2.3–15.9)
RBC.: 3.15 MIL/uL — ABNORMAL LOW (ref 3.87–5.11)
Retic Count, Absolute: 13.2 10*3/uL — ABNORMAL LOW (ref 19.0–186.0)
Retic Ct Pct: 0.5 % (ref 0.4–3.1)

## 2022-08-12 LAB — RESP PANEL BY RT-PCR (RSV, FLU A&B, COVID)  RVPGX2
Influenza A by PCR: POSITIVE — AB
Influenza B by PCR: NEGATIVE
Resp Syncytial Virus by PCR: NEGATIVE
SARS Coronavirus 2 by RT PCR: NEGATIVE

## 2022-08-12 LAB — LIPASE, BLOOD: Lipase: 33 U/L (ref 11–51)

## 2022-08-12 LAB — APTT: aPTT: 25 seconds (ref 24–36)

## 2022-08-12 LAB — PROTIME-INR
INR: 1.1 (ref 0.8–1.2)
Prothrombin Time: 13.8 seconds (ref 11.4–15.2)

## 2022-08-12 LAB — BRAIN NATRIURETIC PEPTIDE: B Natriuretic Peptide: 101.3 pg/mL — ABNORMAL HIGH (ref 0.0–100.0)

## 2022-08-12 MED ORDER — SODIUM CHLORIDE 0.9 % IV SOLN: INTRAVENOUS | Status: AC

## 2022-08-12 MED ORDER — ALBUTEROL SULFATE (2.5 MG/3ML) 0.083% IN NEBU
INHALATION_SOLUTION | RESPIRATORY_TRACT | Status: AC
  Filled 2022-08-12: qty 3

## 2022-08-12 MED ORDER — LACTATED RINGERS IV BOLUS
1000.0000 mL | Freq: Once | INTRAVENOUS | Status: AC
  Administered 2022-08-12: 1000 mL via INTRAVENOUS

## 2022-08-12 MED ORDER — SODIUM CHLORIDE 0.9 % IV SOLN
1.0000 g | Freq: Once | INTRAVENOUS | Status: DC
  Administered 2022-08-12: 1 g via INTRAVENOUS
  Filled 2022-08-12: qty 10

## 2022-08-12 MED ORDER — IOHEXOL 350 MG/ML SOLN
60.0000 mL | Freq: Once | INTRAVENOUS | Status: AC | PRN
  Administered 2022-08-12: 60 mL via INTRAVENOUS

## 2022-08-12 MED ORDER — SODIUM CHLORIDE 0.9 % IV SOLN
500.0000 mg | INTRAVENOUS | Status: DC
  Administered 2022-08-13: 500 mg via INTRAVENOUS
  Filled 2022-08-12: qty 5

## 2022-08-12 MED ORDER — SODIUM CHLORIDE 0.9 % IV SOLN
2.0000 g | INTRAVENOUS | Status: DC
  Administered 2022-08-13: 2 g via INTRAVENOUS
  Filled 2022-08-12: qty 20

## 2022-08-12 MED ORDER — ALBUTEROL SULFATE (2.5 MG/3ML) 0.083% IN NEBU
2.5000 mg | INHALATION_SOLUTION | Freq: Once | RESPIRATORY_TRACT | Status: AC
  Administered 2022-08-12: 2.5 mg via RESPIRATORY_TRACT

## 2022-08-12 MED ORDER — PANTOPRAZOLE SODIUM 40 MG PO TBEC
40.0000 mg | DELAYED_RELEASE_TABLET | Freq: Every day | ORAL | Status: DC
  Administered 2022-08-12 – 2022-08-14 (×3): 40 mg via ORAL
  Filled 2022-08-12 (×3): qty 1

## 2022-08-12 MED ORDER — SODIUM CHLORIDE 0.9 % IV SOLN
500.0000 mg | Freq: Once | INTRAVENOUS | Status: AC
  Administered 2022-08-12: 500 mg via INTRAVENOUS
  Filled 2022-08-12: qty 5

## 2022-08-12 NOTE — H&P (Incomplete)
History and Physical    Caroline Harvey [id number] DOB: [birth year] DOA: 08/12/2022  PCP: Malena Peer, MD  Patient coming from: UC  I have personally briefly reviewed patient's old medical records in [place]  Chief Complaint:  sob/productive cough /abdominal pain /dizziness  HPI: Caroline Harvey is a 62 y.o. female with medical history significant of  ETOH abuse, Anemia, Anxiety , Asthma, GERD, Hypertension, hyponatremia who presents to urgent care with complaint of productive cough , sob , left side pain due to severe coughing x 5-6 days with progression. Patient referred to ED from UC due to hypotension.   ED Course:  IN ED Vitals: afeb , bp 84/66-77/50, hr 95 , rr 20 sat 98% on ra  on intake but dropped to mid 80's recovered with 2 l Esmeralda [id number] tachycardia , LVH with repolarization abn Labs Wbc 9.2, hgb 11.6 (12.7),plt 196 Lipase 33  Na 133 (136),  K 3.7, glu 116, cr 1.38 (0.69) [id number] 1. Mid and lower lung predominant interstitial opacities, likely due to pulmonary edema. 2. Prominence of the bilateral pulmonary arteries, unchanged when compared with prior exam, findings can be seen in the setting of pulmonary hypertension.   UA: + protein rare bacteria wbc 6-10,  wbcp 11-20 CT abd  pelvis  IMPRESSION: 1. Asymmetrically expanded left rectus abdominis muscle containing a rounded focus of hyperattenuation measuring up to 5.1 cm. Overlying subcutaneous soft tissue stranding. Findings are suggestive of a rectus sheath hematoma. Recommend continued clinical follow-up to resolution to exclude underlying mass lesion. 2. Left-greater-than-right ground-glass opacities with focal consolidations in the lingula and left lower lobe, likely multifocal pneumonia. 3. Trace bilateral pleural effusions. 4. Mild circumferential mural thickening of the urinary bladder. Correlate with urinalysis to exclude cystitis. 5. Aortic Atherosclerosis (ICD10-I70.0).   Tx  albuterol neb, LR 1L, azithromycin  Review of Systems: As per HPI otherwise 10 point review of systems negative.   Past Medical History:  Diagnosis Date   Alcohol abuse    Has not had a drink 12/09/2019   Anemia    Anxiety    Asthma    GERD (gastroesophageal reflux disease)    Hypertension    Hyponatremia 09/2014    Past Surgical History:  Procedure Laterality Date   ANKLE SURGERY Right    x  2 (achilles tendon repair, and then broken ankle)   SINUS ENDO WITH FUSION Bilateral 04/05/2022   Procedure: FUNCTIONAL ENDOSCOPIC SINUS SURGERY OF THE FRONTAL, ETHMOID, MAXILLARY AND SPHENOID SINUSES WITH FUSION NAVIGATION;  Surgeon: Jenetta Downer, MD;  Location: [place];  Service: ENT;  Laterality: Bilateral;     reports that she has never smoked. She has never used smokeless tobacco. She reports that she does not currently use alcohol. She reports that she does not use drugs.  Allergies  Allergen Reactions   Hydrochlorothiazide Other (See Comments)   Amoxicillin Other (See Comments)    Numbness in arm and dizzy    Family History  Problem Relation Age of Onset   Hypertension Mother    Colon cancer Maternal Aunt    Breast cancer Paternal Aunt        ? age of onset   Breast cancer Paternal 90        ? age of onset   Stomach cancer Maternal Grandmother    Esophageal cancer Neg Hx    Rectal cancer Neg Hx     Prior to Admission medications   Medication Sig Start Date End Date Taking? Authorizing Provider  albuterol (VENTOLIN HFA) 108 (90 Base) MCG/ACT inhaler Inhale 2 puffs into the lungs every 4 (four) hours as needed for wheezing or shortness of breath. 09/05/20   [provider]  budesonide (PULMICORT) 0.5 MG/2ML nebulizer solution Add 63mL (1 vial) of the budesonide into your 240 mL of saline irrigation bottle and irrigate your sinuses twice daily. 04/06/22 07/06/22  Jenetta Downer, MD  gabapentin (NEURONTIN) 300 MG capsule Take 300 mg by mouth 3 (three) times daily.     [provider]  lisinopril (ZESTRIL) 20 MG tablet Take 1 tablet (20 mg total) by mouth daily. 01/17/20   Darr, Edison Nasuti, PA-C  metoCLOPramide (REGLAN) 5 MG tablet Take 5 mg by mouth 4 (four) times daily -  before meals and at bedtime. 12/01/21   [provider]  naproxen sodium (ALEVE) 220 MG tablet Take 220 mg by mouth 2 (two) times daily as needed (pain).    [provider]  pantoprazole (PROTONIX) 40 MG tablet Take 40 mg by mouth daily.    [provider]  SYMBICORT 160-4.5 MCG/ACT inhaler Inhale 2 puffs into the lungs 2 (two) times daily. 09/28/20   [provider]  traZODone (DESYREL) 100 MG tablet Take 1 tablet (100 mg total) by mouth at bedtime. For sleep 10/15/19 02/14/20  Lindell Spar I, NP    Physical Exam: Vitals:   08/12/22 1630 08/12/22 1935 08/12/22 1943 08/12/22 2003  BP: 123/61 135/69  135/69  Pulse: 74   74  Resp: 16 (!) 21  16  Temp:   98.4 F (36.9 C)   TempSrc:   Oral   SpO2: 100%   100%    Constitutional: NAD, calm, comfortable Vitals:   08/12/22 1630 08/12/22 1935 08/12/22 1943 08/12/22 2003  BP: 123/61 135/69  135/69  Pulse: 74   74  Resp: 16 (!) 21  16  Temp:   98.4 F (36.9 C)   TempSrc:   Oral   SpO2: 100%   100%   Eyes: PERRL, lids and conjunctivae normal ENMT: Mucous membranes are moist. Posterior pharynx clear of any exudate or lesions.Normal dentition.  Neck: normal, supple, no masses, no thyromegaly Respiratory: clear to auscultation bilaterally, no wheezing, no crackles. Normal respiratory effort. No accessory muscle use.  Cardiovascular: Regular rate and rhythm, no murmurs / rubs / gallops. No extremity edema. 2+ pedal pulses. No carotid bruits.  Abdomen: no tenderness, no masses palpated. No hepatosplenomegaly. Bowel sounds positive.  Musculoskeletal: no clubbing / cyanosis. No joint deformity upper and lower extremities. Good ROM, no contractures. Normal muscle tone.  Skin: no rashes, lesions, ulcers. No  induration Neurologic: CN 2-12 grossly intact. Sensation intact, DTR normal. Strength 5/5 in all 4.  Psychiatric: Normal judgment and insight. Alert and oriented x 3. Normal mood.    Labs on Admission: I have personally reviewed following labs and imaging studies  CBC: Recent Labs  Lab 08/12/22 1049  WBC 9.2  HGB 11.6*  HCT 37.3  MCV 86.9  PLT [id number]   Basic Metabolic Panel: Recent Labs  Lab 08/12/22 1049  NA 133*  K 3.7  CL 100  CO2 23  GLUCOSE 116*  BUN 12  CREATININE 1.38*  CALCIUM 8.6*   GFR: CrCl cannot be calculated (Unknown ideal weight.). Liver Function Tests: Recent Labs  Lab 08/12/22 1049  AST 28  ALT 9  ALKPHOS 65  BILITOT 0.8  PROT 7.4  ALBUMIN 3.2*   Recent Labs  Lab 08/12/22 1049  LIPASE 33   No results  for input(s): "AMMONIA" in the last 168 hours. Coagulation Profile: No results for input(s): "INR", "PROTIME" in the last 168 hours. Cardiac Enzymes: No results for input(s): "CKTOTAL", "CKMB", "CKMBINDEX", "TROPONINI" in the last 168 hours. BNP (last 3 results) No results for input(s): "PROBNP" in the last 8760 hours. HbA1C: No results for input(s): "HGBA1C" in the last 72 hours. CBG: No results for input(s): "GLUCAP" in the last 168 hours. Lipid Profile: No results for input(s): "CHOL", "HDL", "LDLCALC", "TRIG", "CHOLHDL", "LDLDIRECT" in the last 72 hours. Thyroid Function Tests: No results for input(s): "TSH", "T4TOTAL", "FREET4", "T3FREE", "THYROIDAB" in the last 72 hours. Anemia Panel: No results for input(s): "VITAMINB12", "FOLATE", "FERRITIN", "TIBC", "IRON", "RETICCTPCT" in the last 72 hours. Urine analysis:    Component Value Date/Time   COLORURINE AMBER (A) 08/12/2022 1937   APPEARANCEUR CLOUDY (A) 08/12/2022 1937   LABSPEC >1.046 (H) 08/12/2022 1937   PHURINE 5.0 08/12/2022 1937   GLUCOSEU NEGATIVE 08/12/2022 1937   HGBUR SMALL (A) 08/12/2022 1937   HGBUR trace-intact 12/18/2009 0908   BILIRUBINUR NEGATIVE 08/12/2022 1937    KETONESUR NEGATIVE 08/12/2022 1937   PROTEINUR 100 (A) 08/12/2022 1937   UROBILINOGEN 0.2 10/03/2014 1623   NITRITE NEGATIVE 08/12/2022 1937   LEUKOCYTESUR NEGATIVE 08/12/2022 1937    Radiological Exams on Admission: CT ABDOMEN PELVIS W CONTRAST  Result Date: 08/12/2022 CLINICAL DATA:  Left-sided abdominal pain with cough EXAM: CT ABDOMEN AND PELVIS WITH CONTRAST TECHNIQUE: Multidetector CT imaging of the abdomen and pelvis was performed using the standard protocol following bolus administration of intravenous contrast. RADIATION DOSE REDUCTION: This exam was performed according to the departmental dose-optimization program which includes automated exposure control, adjustment of the mA and/or kV according to patient size and/or use of iterative reconstruction technique. CONTRAST:  47mL OMNIPAQUE IOHEXOL 350 MG/ML SOLN COMPARISON:  None Available. FINDINGS: Lower chest: Left-greater-than-right ground-glass opacities with focal consolidations in the lingula and left lower lobe. Trace bilateral pleural effusions. Partially imaged heart size is normal. Hepatobiliary: No focal hepatic lesions. No intra or extrahepatic biliary ductal dilation. Normal gallbladder. Pancreas: No focal lesions or main ductal dilation. Spleen: Normal in size without focal abnormality. Adrenals/Urinary Tract: No adrenal nodules. No suspicious renal mass, calculi or hydronephrosis. Bilateral upper pole simple cysts. No specific follow-up imaging recommended. Subcentimeter hypoattenuating focus in the lower pole right kidney, too small to characterize but also likely cysts. Mild circumferential mural thickening of the urinary bladder. Stomach/Bowel: Normal appearance of the stomach. No evidence of bowel wall thickening, distention, or inflammatory changes. Normal appendix. Vascular/Lymphatic: Aortic atherosclerosis. No enlarged abdominal or pelvic lymph nodes. Reproductive: No adnexal masses. Other: Small volume free fluid.  Musculoskeletal: No acute or abnormal lytic or blastic osseous lesions. Asymmetrically expanded left rectus abdominis muscle containing a rounded focus of hyperattenuation measuring 5.1 x 3.9 cm (4:40). Overlying subcutaneous soft tissue stranding. IMPRESSION: 1. Asymmetrically expanded left rectus abdominis muscle containing a rounded focus of hyperattenuation measuring up to 5.1 cm. Overlying subcutaneous soft tissue stranding. Findings are suggestive of a rectus sheath hematoma. Recommend continued clinical follow-up to resolution to exclude underlying mass lesion. 2. Left-greater-than-right ground-glass opacities with focal consolidations in the lingula and left lower lobe, likely multifocal pneumonia. 3. Trace bilateral pleural effusions. 4. Mild circumferential mural thickening of the urinary bladder. Correlate with urinalysis to exclude cystitis. 5. Aortic Atherosclerosis (ICD10-I70.0). Electronically Signed   By: Darrin Nipper M.D.   On: 08/12/2022 17:50   DG Chest Port 1 View  Result Date: 08/12/2022 CLINICAL DATA:  Cough and  left-sided chest pain EXAM: PORTABLE CHEST 1 VIEW COMPARISON:  Chest x-ray dated February 22, 2020 FINDINGS: Cardiac contours are within normal limits AP technique. Prominence of the bilateral pulmonary arteries, unchanged when compared with prior exam. Mid and lower lung predominant interstitial opacities, increased when compared with the prior. No large pleural effusion. No evidence of pneumothorax. IMPRESSION: 1. Mid and lower lung predominant interstitial opacities, likely due to pulmonary edema. 2. Prominence of the bilateral pulmonary arteries, unchanged when compared with prior exam, findings can be seen in the setting of pulmonary hypertension. Electronically Signed   By: Yetta Glassman M.D.   On: 08/12/2022 12:51    EKG: Independently reviewed.   Assessment/Plan CAP with associated hypoxic respiratory failure -note infiltrate on CT thorax -ctx/ azithromycin,  de-escalate as able  -pulmonary toilet  -urine ag, sputum, f/u on culture data  -supportive O2, wean as able  -to be complete will check inflammatory markers   Rectal Sheath hematoma  - monitor h/h , further imaging if h/h trends down ward -presumed cause by vigorous coughing   Hypotension  -resolved s/p ivfs  -hold all ant-htn medications   Essential HTN -holding all meds due to period of hypotension  -resume as bp necessitates    Hx of ETOH - place  on ciwa    History of hyponatremia chronic - currently around baseline at 133, was 136 previous  -monitor   Anemia -slight drop 1gram  - will monitor h/h and check anemia labs    Asthma -no current flare , no wheezing on exam   GERD -ppi     ETOH abuse, Anemia, Anxiety , Asthma, GERD, Hypertension, hyponatremia  DVT prophylaxis: [place] Code Status: full/ as discussed per patient wishes in event of cardiac arrest  Family Communication: Disposition Plan: patient  expected to be admitted greater than 2 midnights  Consults called: n/a Admission status: inpatient [place] [place]    Clance Boll MD [facility]   If 7PM-7AM, please contact night-coverage [email or web address] Password [facility]  08/12/2022, 8:17 PM

## 2022-08-12 NOTE — ED Notes (Signed)
Pt placed on 2L [place] for supplemental oxygen, complaining of SOB and desat to 84% when coughing

## 2022-08-12 NOTE — ED Notes (Signed)
ED TO INPATIENT HANDOFF REPORT  ED Nurse Name and Phone #: Gregary Signs [phone]  S Name/Age/Gender Caroline Harvey 62 y.o. female Room/Bed: 028C/028C  Code Status   Code Status: Full Code  Home/SNF/Other Home Patient oriented to: self Is this baseline? Yes   Triage Complete: Triage complete  Chief Complaint CAP (community acquired pneumonia) [J18.9]  Triage Note Patient complains of left sided abdominal pain that started Wednesday and is exacerbated by movement and coughing. Patient denies nausea, vomiting, and diarrhea. Patient is alert, oriented, and in no apparent distress at this time.   Allergies Allergies  Allergen Reactions   Hydrochlorothiazide Other (See Comments)   Amoxicillin Other (See Comments)    Numbness in arm and dizzy    Level of Care/Admitting Diagnosis ED Disposition     ED Disposition  Admit   Condition  --   [place]: [place] [[id number]]  Level of Care: Progressive [102]  Admit to Progressive based on following criteria: RESPIRATORY PROBLEMS hypoxemic/hypercapnic respiratory failure that is responsive to NIPPV (BiPAP) or High Flow Nasal Cannula (6-80 lpm). Frequent assessment/intervention, no > Q2 hrs < Q4 hrs, to maintain oxygenation and pulmonary hygiene.  May [facility] patient to [facility] or [facility] if equivalent level of care is available:: No  Covid Evaluation: Confirmed COVID Negative  Diagnosis: CAP (community acquired pneumonia) [id number]  Admitting Physician: Clance Boll [id number]  Attending Physician: Clance Boll [id number]  Certification:: I certify this patient will need inpatient services for at least 2 midnights  Estimated Length of Stay: 3          B Medical/Surgery History Past Medical History:  Diagnosis Date   Alcohol abuse    Has not had a drink 12/09/2019   Anemia    Anxiety    Asthma    GERD (gastroesophageal reflux disease)    Hypertension    Hyponatremia 09/2014    Past Surgical History:  Procedure Laterality Date   ANKLE SURGERY Right    x  2 (achilles tendon repair, and then broken ankle)   SINUS ENDO WITH FUSION Bilateral 04/05/2022   Procedure: FUNCTIONAL ENDOSCOPIC SINUS SURGERY OF THE FRONTAL, ETHMOID, MAXILLARY AND SPHENOID SINUSES WITH FUSION NAVIGATION;  Surgeon: Jenetta Downer, MD;  Location: [facility];  Service: ENT;  Laterality: Bilateral;     A IV Location/Drains/Wounds Patient Lines/Drains/Airways Status     Active Line/Drains/Airways     Name Placement date Placement time Site Days   Peripheral IV 08/12/22 22 G Anterior;Left Wrist 08/12/22  1235  Wrist  less than 1   Peripheral IV 08/12/22 20 G 1.88" Anterior;Right Forearm 08/12/22  1344  Forearm  less than 1   Incision (Closed) 04/05/22 Nose Other (Comment) 04/05/22  1158  -- 129            Intake/Output Last 24 hours  Intake/Output Summary (Last 24 hours) at 08/12/2022 2048 Last data filed at 08/12/2022 1527 Gross per 24 hour  Intake 1000 ml  Output --  Net 1000 ml    Labs/Imaging Results for orders placed or performed during the hospital encounter of 08/12/22 (from the past 48 hour(s))  Lipase, blood     Status: None   Collection Time: 08/12/22 10:49 AM  Result Value Ref Range   Lipase 33 11 - 51 U/L    Comment: Performed at [facility], [place] [address], [place], [place] [address]  Comprehensive metabolic panel     Status: Abnormal   Collection Time:  08/12/22 10:49 AM  Result Value Ref Range   Sodium 133 (L) 135 - 145 mmol/L   Potassium 3.7 3.5 - 5.1 mmol/L   Chloride 100 98 - 111 mmol/L   CO2 23 22 - 32 mmol/L   Glucose, Bld 116 (H) 70 - 99 mg/dL    Comment: Glucose reference range applies only to samples taken after fasting for at least 8 hours.   BUN 12 6 - 20 mg/dL   Creatinine, Ser 1.38 (H) 0.44 - 1.00 mg/dL   Calcium 8.6 (L) 8.9 - 10.3 mg/dL   Total Protein 7.4 6.5 - 8.1 g/dL   Albumin 3.2 (L) 3.5 - 5.0 g/dL   AST 28 15 - 41 U/L   ALT 9 0 - 44  U/L   Alkaline Phosphatase 65 38 - 126 U/L   Total Bilirubin 0.8 0.3 - 1.2 mg/dL   GFR, Estimated 44 (L) >60 mL/min    Comment: (NOTE) Calculated using the CKD-EPI Creatinine Equation (2021)    Anion gap 10 5 - 15    Comment: Performed at [facility], [place] [address], [place], [place] [address]  CBC     Status: Abnormal   Collection Time: 08/12/22 10:49 AM  Result Value Ref Range   WBC 9.2 4.0 - 10.5 K/uL   RBC 4.29 3.87 - 5.11 MIL/uL   Hemoglobin 11.6 (L) 12.0 - 15.0 g/dL   HCT 37.3 36.0 - 46.0 %   MCV 86.9 80.0 - 100.0 fL   MCH 27.0 26.0 - 34.0 pg   MCHC 31.1 30.0 - 36.0 g/dL   RDW 13.6 11.5 - 15.5 %   Platelets 196 150 - 400 K/uL   nRBC 0.0 0.0 - 0.2 %    Comment: Performed at [facility], [place] [address] [place], [place] [address]  Urinalysis, Routine w reflex microscopic -Urine, Clean Catch     Status: Abnormal   Collection Time: 08/12/22  7:37 PM  Result Value Ref Range   Color, Urine AMBER (A) YELLOW    Comment: BIOCHEMICALS MAY BE AFFECTED BY COLOR   APPearance CLOUDY (A) CLEAR   Specific Gravity, Urine >1.046 (H) 1.005 - 1.030   pH 5.0 5.0 - 8.0   Glucose, UA NEGATIVE NEGATIVE mg/dL   Hgb urine dipstick SMALL (A) NEGATIVE   Bilirubin Urine NEGATIVE NEGATIVE   Ketones, ur NEGATIVE NEGATIVE mg/dL   Protein, ur 100 (A) NEGATIVE mg/dL   Nitrite NEGATIVE NEGATIVE   Leukocytes,Ua NEGATIVE NEGATIVE   RBC / HPF 11-20 0 - 5 RBC/hpf   WBC, UA 6-10 0 - 5 WBC/hpf   Bacteria, UA RARE (A) NONE SEEN   Squamous Epithelial / HPF 6-10 0 - 5 /HPF   Mucus PRESENT    Hyaline Casts, UA PRESENT     Comment: Performed at [facility], [address] [place], [place] [address]  Protime-INR     Status: None   Collection Time: 08/12/22  7:40 PM  Result Value Ref Range   Prothrombin Time 13.8 11.4 - 15.2 seconds   INR 1.1 0.8 - 1.2    Comment: (NOTE) INR goal varies based on device and disease states. Performed at [facility], [place] [address]  [place], [place] [address]   APTT     Status: None   Collection Time: 08/12/22  7:40 PM  Result Value Ref Range   aPTT 25 24 - 36 seconds    Comment: Performed at [facility],  [address] [place], [place] [address]   CT ABDOMEN PELVIS W CONTRAST  Result Date: 08/12/2022 CLINICAL DATA:  Left-sided abdominal pain with cough EXAM: CT ABDOMEN AND PELVIS WITH CONTRAST TECHNIQUE: Multidetector CT imaging of the abdomen and pelvis was performed using the standard protocol following bolus administration of intravenous contrast. RADIATION DOSE REDUCTION: This exam was performed according to the departmental dose-optimization program which includes automated exposure control, adjustment of the mA and/or kV according to patient size and/or use of iterative reconstruction technique. CONTRAST:  61mL OMNIPAQUE IOHEXOL 350 MG/ML SOLN COMPARISON:  None Available. FINDINGS: Lower chest: Left-greater-than-right ground-glass opacities with focal consolidations in the lingula and left lower lobe. Trace bilateral pleural effusions. Partially imaged heart size is normal. Hepatobiliary: No focal hepatic lesions. No intra or extrahepatic biliary ductal dilation. Normal gallbladder. Pancreas: No focal lesions or main ductal dilation. Spleen: Normal in size without focal abnormality. Adrenals/Urinary Tract: No adrenal nodules. No suspicious renal mass, calculi or hydronephrosis. Bilateral upper pole simple cysts. No specific follow-up imaging recommended. Subcentimeter hypoattenuating focus in the lower pole right kidney, too small to characterize but also likely cysts. Mild circumferential mural thickening of the urinary bladder. Stomach/Bowel: Normal appearance of the stomach. No evidence of bowel wall thickening, distention, or inflammatory changes. Normal appendix. Vascular/Lymphatic: Aortic atherosclerosis. No enlarged abdominal or pelvic lymph nodes. Reproductive: No adnexal masses. Other: Small volume free fluid.  Musculoskeletal: No acute or abnormal lytic or blastic osseous lesions. Asymmetrically expanded left rectus abdominis muscle containing a rounded focus of hyperattenuation measuring 5.1 x 3.9 cm (4:40). Overlying subcutaneous soft tissue stranding. IMPRESSION: 1. Asymmetrically expanded left rectus abdominis muscle containing a rounded focus of hyperattenuation measuring up to 5.1 cm. Overlying subcutaneous soft tissue stranding. Findings are suggestive of a rectus sheath hematoma. Recommend continued clinical follow-up to resolution to exclude underlying mass lesion. 2. Left-greater-than-right ground-glass opacities with focal consolidations in the lingula and left lower lobe, likely multifocal pneumonia. 3. Trace bilateral pleural effusions. 4. Mild circumferential mural thickening of the urinary bladder. Correlate with urinalysis to exclude cystitis. 5. Aortic Atherosclerosis (ICD10-I70.0). Electronically Signed   By: Darrin Nipper M.D.   On: 08/12/2022 17:50   DG Chest Port 1 View  Result Date: 08/12/2022 CLINICAL DATA:  Cough and left-sided chest pain EXAM: PORTABLE CHEST 1 VIEW COMPARISON:  Chest x-ray dated February 22, 2020 FINDINGS: Cardiac contours are within normal limits AP technique. Prominence of the bilateral pulmonary arteries, unchanged when compared with prior exam. Mid and lower lung predominant interstitial opacities, increased when compared with the prior. No large pleural effusion. No evidence of pneumothorax. IMPRESSION: 1. Mid and lower lung predominant interstitial opacities, likely due to pulmonary edema. 2. Prominence of the bilateral pulmonary arteries, unchanged when compared with prior exam, findings can be seen in the setting of pulmonary hypertension. Electronically Signed   By: Yetta Glassman M.D.   On: 08/12/2022 12:51    Pending Labs Unresulted Labs (From admission, onward)     Start     Ordered   08/13/22 0500  Comprehensive metabolic panel  Tomorrow morning,   R         08/12/22 2045   08/13/22 0500  CBC with Differential/Platelet  Tomorrow morning,   R        08/12/22 2045   08/12/22 2047  C-reactive protein  Once,   R        08/12/22 2046   08/12/22 2047  Lactic acid, plasma  STAT Now then every 3 hours,  R (with STAT occurrences)      08/12/22 2046   08/12/22 2043  Legionella Pneumophila Serogp 1 Ur Ag  Once,   R        08/12/22 2045   08/12/22 2043  Strep pneumoniae urinary antigen  Once,   R        08/12/22 2045   08/12/22 2043  Urinalysis, w/ Reflex to Culture (Infection Suspected) -Urine, Clean Catch  (Urine Culture)  Once,   R       Question:  Specimen Source  Answer:  Urine, Clean Catch   08/12/22 2045   08/12/22 2042  TSH  Once,   R        08/12/22 2045   08/12/22 2042  Culture, blood (routine x 2) Call MD if unable to obtain prior to antibiotics being given  BLOOD CULTURE X 2,   R (with TIMED occurrences)     Comments: If blood cultures drawn in Emergency Department - Do not draw and cancel order    08/12/22 2045   08/12/22 2042  Expectorated Sputum Assessment w Gram Stain, Rflx to Resp Cult  Once,   R        08/12/22 2045   08/12/22 2042  Respiratory (~20 pathogens) panel by PCR  (Respiratory panel by PCR (~20 pathogens, ~24 hr TAT)  w precautions)  Once,   R        08/12/22 2045   08/12/22 2040  HIV Antibody (routine testing w rflx)  (HIV Antibody (Routine testing w reflex) panel)  Once,   R        08/12/22 2045   08/12/22 2039  Procalcitonin  Once,   R       References:    Procalcitonin Lower Respiratory Tract Infection AND Sepsis Procalcitonin Algorithm   08/12/22 2038   08/12/22 2039  Vitamin B12  (Anemia Panel (PNL))  Once,   R        08/12/22 2038   08/12/22 2039  Folate  (Anemia Panel (PNL))  Once,   R        08/12/22 2038   08/12/22 2039  Iron and TIBC  (Anemia Panel (PNL))  Once,   R        08/12/22 2038   08/12/22 2039  Ferritin  (Anemia Panel (PNL))  Once,   R        08/12/22 2038   08/12/22 2039  Reticulocytes  (Anemia  Panel (PNL))  Once,   R        08/12/22 2038   08/12/22 1908  Culture, blood (Routine X 2) w Reflex to ID Panel  BLOOD CULTURE X 2,   R (with STAT occurrences)      08/12/22 1907   08/12/22 1908  Lactic acid, plasma  Now then every 2 hours,   R (with STAT occurrences)      08/12/22 1907   08/12/22 1901  Brain natriuretic peptide  Once,   URGENT        08/12/22 1900   08/12/22 1900  Resp panel by RT-PCR (RSV, Flu A&B, Covid) Anterior Nasal Swab  (Resp panel by RT-PCR (RSV, Flu A&B, Covid))  Once,   URGENT        08/12/22 1859            Vitals/Pain Today's Vitals   08/12/22 1630 08/12/22 1935 08/12/22 1943 08/12/22 2003  BP: 123/61 135/69  135/69  Pulse: 74   74  Resp: 16 (!) 21  16  Temp:   98.4 F (36.9 C)   TempSrc:   Oral   SpO2: 100%   100%  PainSc:        Isolation Precautions Droplet precaution  Medications Medications  pantoprazole (PROTONIX) EC tablet 40 mg (has no administration in time range)  cefTRIAXone (ROCEPHIN) 2 g in sodium chloride 0.9 % 100 mL IVPB (has no administration in time range)  azithromycin (ZITHROMAX) 500 mg in sodium chloride 0.9 % 250 mL IVPB (has no administration in time range)  0.9 %  sodium chloride infusion (has no administration in time range)  lactated ringers bolus 1,000 mL (0 mLs Intravenous Stopped 08/12/22 1527)  iohexol (OMNIPAQUE) 350 MG/ML injection 60 mL (60 mLs Intravenous Contrast Given 08/12/22 1735)  azithromycin (ZITHROMAX) 500 mg in sodium chloride 0.9 % 250 mL IVPB (500 mg Intravenous New Bag/Given 08/12/22 1942)    Mobility walks     Focused Assessments Cardiac Assessment Handoff:    Lab Results  Component Value Date   TROPONINI 0.03 ([place]) 01/02/2017   No results found for: "DDIMER" Does the Patient currently have chest pain? No    R Recommendations: See Admitting Provider Note  Report given to:   Additional Notes: A+O x 4

## 2022-08-12 NOTE — ED Notes (Signed)
Pt has small veins, two RN's attempted IV and only able to get a 22G in left wrist. Will consult IV team for a larger IV for CT with contrast

## 2022-08-12 NOTE — ED Provider Triage Note (Signed)
Emergency Medicine Provider Triage Evaluation Note  Caroline Harvey , a 62 y.o. female  was evaluated in triage.  Pt complains of left lower quadrant abdominal pain and generalized weakness for the last 5 days.  She states she has not experienced this pain before.  She denies nausea, vomiting, diarrhea, fever, chills, chest pain, dysuria, hematuria, or shortness of breath.  No previous abdominal surgeries.  Review of Systems  Positive: See HPI Negative: See HPI  Physical Exam  BP (!) 77/50 (BP Location: Right Arm)   Pulse (!) 102   Temp 98.7 F (37.1 C) (Oral)   Resp 18   LMP 12/14/2010   SpO2 100%  Gen:   Awake, no distress   Resp:  Normal effort  MSK:   Moves extremities without difficulty  Other:  Abdomen soft, nontender, and without rebound, guarding, or peritoneal signs, no CVA tenderness  Medical Decision Making  Medically screening exam initiated at 10:47 AM.  Appropriate orders placed.  Caroline Harvey was informed that the remainder of the evaluation will be completed by another provider, this initial triage assessment does not replace that evaluation, and the importance of remaining in the ED until their evaluation is complete.     Caroline Righter, PA-C 08/12/22 1058

## 2022-08-12 NOTE — ED Notes (Signed)
Called CT to check on status of scan, pt is currently #5 on the list

## 2022-08-12 NOTE — Discharge Instructions (Addendum)
D/C to ED via POV 

## 2022-08-12 NOTE — ED Notes (Signed)
Patient is being discharged from the Urgent Care and sent to the Emergency Department via POV with friend. Per [employer], PA, patient is in need of higher level of care due to hypotension, abd pain and shortness of breath. Patient is aware and verbalizes understanding of plan of care.  Vitals:   08/12/22 0929 08/12/22 0932  BP: (!) 84/66 102/70  Pulse: 95   Resp: 20   Temp: 98.5 F (36.9 C)   SpO2: 98%

## 2022-08-12 NOTE — ED Triage Notes (Signed)
Patient complains of left sided abdominal pain that started Wednesday and is exacerbated by movement and coughing. Patient denies nausea, vomiting, and diarrhea. Patient is alert, oriented, and in no apparent distress at this time.

## 2022-08-12 NOTE — ED Provider Notes (Addendum)
Patient sent in from urgent care for low blood pressure.  with abdominal pain.  While here patient dropped her sats down to 84% on room air and was put on 2 L of oxygen.  Sats remain good on that.  With fluids patient's blood pressure did come up.  Patient had chest x-ray done that they thought that lower lobes was may be pulmonary edema but there were some opacities there.  CT abdomen and pelvis was done for the abdominal pain.  That shows a rectus muscle hematoma.  PT and PTT are pending for that.  Patient is not on blood thinners.  Also shows multifocal pneumonia in the lower lobes of the lungs.  This would explain probably the hypoxia and may have explain some of the hypotension.  Blood culture sent lactic acid send patient vital signs currently are not meeting sepsis criteria.  Patient did not have a fever.  Will send respiratory panel.  Patient has had a cough for a number of days.  Patient was started on Rocephin and Zithromax for the pneumonia will contact medicine for admission due to the hypoxia.  The rectus muscle hematoma will will be followed as an outpatient assuming that she does not show evidence of a coagulopathy.  CRITICAL CARE Performed by: Fredia Sorrow Total critical care time: 45 minutes Critical care time was exclusive of separately billable procedures and treating other patients. Critical care was necessary to treat or prevent imminent or life-threatening deterioration. Critical care was time spent personally by me on the following activities: development of treatment plan with patient and/or surrogate as well as nursing, discussions with consultants, evaluation of patient's response to treatment, examination of patient, obtaining history from patient or surrogate, ordering and performing treatments and interventions, ordering and review of laboratory studies, ordering and review of radiographic studies, pulse oximetry and re-evaluation of patient's condition.    Fredia Sorrow, MD 08/12/22 Pauline Aus    Fredia Sorrow, MD 08/12/22 1910    Fredia Sorrow, MD 08/13/22 1343

## 2022-08-12 NOTE — ED Provider Notes (Signed)
[place]    CSN: [id number] Arrival date & time: 08/12/22  [id number]     History   Chief Complaint Chief Complaint  Patient presents with   Cough   Abdominal Pain    HPI Caroline Harvey is a 62 y.o. female.  Here with 5 day history of cough and shortness of breath Worsening over the last few days Dizzy with standing Left side 10/10 abdominal pain, worse with moving and coughing No nausea, vomiting, or diarrhea. Does endorse constipation. No BM in the last few days. Passing some gas No fever Tried an allergy med Hx asthma  Past Medical History:  Diagnosis Date   Alcohol abuse    Has not had a drink 12/09/2019   Anemia    Anxiety    Asthma    GERD (gastroesophageal reflux disease)    Hypertension    Hyponatremia 09/2014    Patient Active Problem List   Diagnosis Date Noted   MDD (major depressive disorder), recurrent severe, without psychosis ([place]) 10/11/2019   Mood disorder, drug-induced ([place]) 07/29/2019   Substance induced mood disorder ([place]) 07/29/2019   Respiratory failure ([place]) 01/02/2017   SOB (shortness of breath) 01/01/2017   Asthma exacerbation 01/28/2016   Bronchiolitis 01/27/2016   Hyponatremia 10/03/2014   Nausea vomiting and diarrhea 10/03/2014   Leukopenia 10/03/2014   Acute kidney injury ([place]) 10/03/2014   TRICHOMONIASIS 12/01/2009   FRACTURE, ANKLE, RIGHT 07/05/2009   GERD 02/07/2009   BACK PAIN, LUMBAR 02/24/2008   GOUT, UNSPECIFIED 11/25/2007   Alcohol abuse 11/25/2007   SUPERFICIAL THROMBOPHLEBITIS 03/18/2007   METATARSALGIA 03/18/2007   VARICOSE VEIN 02/18/2007   Pain in limb 02/18/2007   B12 DEFICIENCY 01/12/2007   OBESITY NOS 01/12/2007   HYPERTENSION, BENIGN 01/12/2007   EDEMA LEG 01/12/2007    Past Surgical History:  Procedure Laterality Date   ANKLE SURGERY Right    x  2 (achilles tendon repair, and then broken ankle)   SINUS ENDO WITH FUSION Bilateral 04/05/2022   Procedure: FUNCTIONAL ENDOSCOPIC SINUS SURGERY OF  THE FRONTAL, ETHMOID, MAXILLARY AND SPHENOID SINUSES WITH FUSION NAVIGATION;  Surgeon: Jenetta Downer, MD;  Location: [facility] OR;  Service: ENT;  Laterality: Bilateral;    OB History     Gravida  1   Para  1   Term  1   Preterm      AB      Living  1      SAB      IAB      Ectopic      Multiple      Live Births               Home Medications    Prior to Admission medications   Medication Sig Start Date End Date Taking? Authorizing Provider  albuterol (VENTOLIN HFA) 108 (90 Base) MCG/ACT inhaler Inhale 2 puffs into the lungs every 4 (four) hours as needed for wheezing or shortness of breath. 09/05/20   [provider]  budesonide (PULMICORT) 0.5 MG/2ML nebulizer solution Add 35mL (1 vial) of the budesonide into your 240 mL of saline irrigation bottle and irrigate your sinuses twice daily. 04/06/22 07/06/22  Jenetta Downer, MD  gabapentin (NEURONTIN) 300 MG capsule Take 300 mg by mouth 3 (three) times daily.    [provider]  lisinopril (ZESTRIL) 20 MG tablet Take 1 tablet (20 mg total) by mouth daily. 01/17/20   Darr, Edison Nasuti, PA-C  metoCLOPramide (REGLAN) 5 MG tablet Take 5 mg by mouth 4 (  four) times daily -  before meals and at bedtime. 12/01/21   [provider]  naproxen sodium (ALEVE) 220 MG tablet Take 220 mg by mouth 2 (two) times daily as needed (pain).    [provider]  pantoprazole (PROTONIX) 40 MG tablet Take 40 mg by mouth daily.    [provider]  SYMBICORT 160-4.5 MCG/ACT inhaler Inhale 2 puffs into the lungs 2 (two) times daily. 09/28/20   [provider]  traZODone (DESYREL) 100 MG tablet Take 1 tablet (100 mg total) by mouth at bedtime. For sleep 10/15/19 02/14/20  Encarnacion Slates, NP    Family History Family History  Problem Relation Age of Onset   Hypertension Mother    Colon cancer Maternal Aunt    Breast cancer Paternal Aunt        ? age of onset   Breast cancer Paternal 76        ? age of onset    Stomach cancer Maternal Grandmother    Esophageal cancer Neg Hx    Rectal cancer Neg Hx     Social History Social History   Tobacco Use   Smoking status: Never   Smokeless tobacco: Never   Tobacco comments:    VAPING  Vaping Use   Vaping Use: Every day   Substances: Nicotine  Substance Use Topics   Alcohol use: Not Currently    Comment: Quit 12/09/19   Drug use: No     Allergies   Hydrochlorothiazide and Amoxicillin   Review of Systems Review of Systems  Respiratory:  Positive for cough.   Gastrointestinal:  Positive for abdominal pain.   As per HPI  Physical Exam Triage Vital Signs ED Triage Vitals  Enc Vitals Group     BP 08/12/22 0929 (!) 84/66     Pulse Rate 08/12/22 0929 95     Resp 08/12/22 0929 20     Temp 08/12/22 0929 98.5 F (36.9 C)     Temp Source 08/12/22 0929 Oral     SpO2 08/12/22 0929 98 %     Weight --      Height --      Head Circumference --      Peak Flow --      Pain Score 08/12/22 0928 10     Pain Loc --      Pain Edu? --      Excl. in [place]? --    No data found.  Updated Vital Signs BP 102/70 (BP Location: Right Arm)   Pulse 95   Temp 98.5 F (36.9 C) (Oral)   Resp 20   LMP 12/14/2010   SpO2 98%   Physical Exam Vitals and nursing note reviewed.  Constitutional:      General: She is not in acute distress.    Appearance: She is not ill-appearing.  HENT:     Mouth/Throat:     Pharynx: Oropharynx is clear.  Cardiovascular:     Rate and Rhythm: Normal rate and regular rhythm.     Heart sounds: Normal heart sounds.  Pulmonary:     Effort: Tachypnea present.     Breath sounds: Rhonchi present.     Comments: Right lung with rales  Abdominal:     General: Abdomen is protuberant.     Tenderness: There is abdominal tenderness in the left upper quadrant and left lower quadrant. There is no rebound.     Comments: Tender with light palpation of upper and lower left abdomen. Point  tenderness with deep palpation left/epigastric  area  Musculoskeletal:     Cervical back: Normal range of motion.  Neurological:     Mental Status: She is alert and oriented to person, place, and time.    UC Treatments / Results  Labs (all labs ordered are listed, but only abnormal results are displayed) Labs Reviewed - No data to display  EKG  Radiology No results found.  Procedures Procedures (including critical care time)  Medications Ordered in UC Medications  albuterol (PROVENTIL) (2.5 MG/3ML) 0.083% nebulizer solution 2.5 mg (2.5 mg Nebulization Given 08/12/22 1003)    Initial Impression / Assessment and Plan / UC Course  I have reviewed the triage vital signs and the nursing notes.  Pertinent labs & imaging results that were available during my care of the patient were reviewed by me and considered in my medical decision making (see chart for details).  Mildly tachypneic and hypotensive Albuterol neb given with some improvement in shortness of breath With her abdominal pain I have recommended she be evaluated in the ED for higher level of care. Likely needs CT scan. Discharged to ED via [place] with friend  Final Clinical Impressions(s) / UC Diagnoses   Final diagnoses:  Abdominal pain, left lower quadrant  Abnormal lung sounds     Discharge Instructions      D/C to ED via [place]     ED Prescriptions   None    PDMP not reviewed this encounter.   Nicolemarie Wooley, Wells Guiles, PA-C 08/12/22 1019

## 2022-08-12 NOTE — ED Triage Notes (Signed)
Pt reports since Wed had productive cough, SOb and left sided abd pains from cough. Took allergy medications. Reports dizzy when stands up.

## 2022-08-12 NOTE — ED Provider Notes (Incomplete Revision)
Patient sent in from urgent care for low blood pressure.  with abdominal pain.  While here patient dropped her sats down to 84% on room air and was put on 2 L of oxygen.  Sats remain good on that.  With fluids patient's blood pressure did come up.  Patient had chest x-ray done that they thought that lower lobes was may be pulmonary edema but there were some opacities there.  CT abdomen and pelvis was done for the abdominal pain.  That shows a rectus muscle hematoma.  PT and PTT are pending for that.  Patient is not on blood thinners.  Also shows multifocal pneumonia in the lower lobes of the lungs.  This would explain probably the hypoxia and may have explain some of the hypotension.  Blood culture sent lactic acid send patient vital signs currently are not meeting sepsis criteria.  Patient did not have a fever.  Will send respiratory panel.  Patient has had a cough for a number of days.  Patient was started on Rocephin and Zithromax for the pneumonia will contact medicine for admission due to the hypoxia.  The rectus muscle hematoma will will be followed as an outpatient assuming that she does not show evidence of a coagulopathy.  CRITICAL CARE Performed by: Fredia Sorrow Total critical care time: 45 minutes Critical care time was exclusive of separately billable procedures and treating other patients. Critical care was necessary to treat or prevent imminent or life-threatening deterioration. Critical care was time spent personally by me on the following activities: development of treatment plan with patient and/or surrogate as well as nursing, discussions with consultants, evaluation of patient's response to treatment, examination of patient, obtaining history from patient or surrogate, ordering and performing treatments and interventions, ordering and review of laboratory studies, ordering and review of radiographic studies, pulse oximetry and re-evaluation of patient's condition.    Fredia Sorrow, MD 08/12/22 Pauline Aus    Fredia Sorrow, MD 08/12/22 1910

## 2022-08-12 NOTE — ED Provider Notes (Signed)
[facility] Provider Note   CSN: [id number] Arrival date & time: 08/12/22  1029     History  Chief Complaint  Patient presents with   Abdominal Pain    Caroline Harvey is a 62 y.o. female.  Patient is a 62 year old female with a history of hypertension, anemia, asthma and GERD who is presenting today with the complaint of abdominal pain or shortness of breath.  Patient initially went to urgent care and was sent here to due to low blood pressure.  Patient reports that for the last 6 days she has had congestion, cough, intermittent wheezing with shortness of breath.  She also reports in the last 3 to 4 days she has developed abdominal pain more on the left side that is worse with any type of coughing movement or shifting.  Last bowel movement was on Wednesday and she reports it is not unusual for her.  She has had no urinary symptoms.  She denies any prior surgery on her abdomen and has never had pain like this in the past.  She denies pain when taking a deep breath.  She also reports she has not been eating because she has not had an appetite.  Cough is nonproductive and she denies fever.  The history is provided by the patient and medical records.  Abdominal Pain      Home Medications Prior to Admission medications   Medication Sig Start Date End Date Taking? Authorizing Provider  albuterol (VENTOLIN HFA) 108 (90 Base) MCG/ACT inhaler Inhale 2 puffs into the lungs every 4 (four) hours as needed for wheezing or shortness of breath. 09/05/20   [provider]  budesonide (PULMICORT) 0.5 MG/2ML nebulizer solution Add 43mL (1 vial) of the budesonide into your 240 mL of saline irrigation bottle and irrigate your sinuses twice daily. 04/06/22 07/06/22  Jenetta Downer, MD  gabapentin (NEURONTIN) 300 MG capsule Take 300 mg by mouth 3 (three) times daily.    [provider]  lisinopril (ZESTRIL) 20 MG tablet Take 1 tablet (20 mg total)  by mouth daily. 01/17/20   Darr, Edison Nasuti, PA-C  metoCLOPramide (REGLAN) 5 MG tablet Take 5 mg by mouth 4 (four) times daily -  before meals and at bedtime. 12/01/21   [provider]  naproxen sodium (ALEVE) 220 MG tablet Take 220 mg by mouth 2 (two) times daily as needed (pain).    [provider]  pantoprazole (PROTONIX) 40 MG tablet Take 40 mg by mouth daily.    [provider]  SYMBICORT 160-4.5 MCG/ACT inhaler Inhale 2 puffs into the lungs 2 (two) times daily. 09/28/20   [provider]  traZODone (DESYREL) 100 MG tablet Take 1 tablet (100 mg total) by mouth at bedtime. For sleep 10/15/19 02/14/20  Lindell Spar I, NP      Allergies    Hydrochlorothiazide and Amoxicillin    Review of Systems   Review of Systems  Gastrointestinal:  Positive for abdominal pain.    Physical Exam Updated Vital Signs BP (!) 77/50 (BP Location: Right Arm)   Pulse (!) 102   Temp 98.7 F (37.1 C) (Oral)   Resp 18   LMP 12/14/2010   SpO2 100%  Physical Exam Vitals and nursing note reviewed.  Constitutional:      General: She is not in acute distress.    Appearance: She is well-developed.  HENT:     Head: Normocephalic and atraumatic.     Mouth/Throat:  Mouth: Mucous membranes are dry.  Eyes:     Pupils: Pupils are equal, round, and reactive to light.  Cardiovascular:     Rate and Rhythm: Normal rate and regular rhythm.     Heart sounds: Normal heart sounds. No murmur heard.    No friction rub.  Pulmonary:     Effort: Pulmonary effort is normal.     Breath sounds: Normal breath sounds. No wheezing or rales.     Comments: Coarse breath sounds but they clear with coughing.  No wheezing at this time. Abdominal:     General: Bowel sounds are normal. There is no distension.     Palpations: Abdomen is soft. There is mass.     Tenderness: There is abdominal tenderness in the epigastric area and left upper quadrant. There is guarding. There is no rebound.   Musculoskeletal:        General: No tenderness. Normal range of motion.     Comments: No edema  Skin:    General: Skin is warm and dry.     Findings: No rash.  Neurological:     Mental Status: She is alert and oriented to person, place, and time. Mental status is at baseline.     Cranial Nerves: No cranial nerve deficit.  Psychiatric:        Mood and Affect: Mood normal.        Behavior: Behavior normal.     ED Results / Procedures / Treatments   Labs (all labs ordered are listed, but only abnormal results are displayed) Labs Reviewed  COMPREHENSIVE METABOLIC PANEL - Abnormal; Notable for the following components:      Result Value   Sodium 133 (*)    Glucose, Bld 116 (*)    Creatinine, Ser 1.38 (*)    Calcium 8.6 (*)    Albumin 3.2 (*)    GFR, Estimated 44 (*)    All other components within normal limits  CBC - Abnormal; Notable for the following components:   Hemoglobin 11.6 (*)    All other components within normal limits  LIPASE, BLOOD  URINALYSIS, ROUTINE W REFLEX MICROSCOPIC    EKG EKG Interpretation  Date/Time:  Monday August 12 2022 10:35:04 EDT Ventricular Rate:  101 PR Interval:  124 QRS Duration: 92 QT Interval:  354 QTC Calculation: 459 R Axis:   -13 Text Interpretation: Sinus tachycardia Left ventricular hypertrophy with repolarization abnormality ( R in aVL , [employer] product ) When compared with ECG of 27-Mar-2022 11:13, PREVIOUS ECG IS PRESENT No significant change since last tracing Confirmed by Blanchie Dessert [phone]) on 08/12/2022 11:36:49 AM  Radiology No results found.  Procedures Procedures    Medications Ordered in ED Medications  lactated ringers bolus 1,000 mL (has no administration in time range)    ED Course/ Medical Decision Making/ A&P                             Medical Decision Making Amount and/or Complexity of Data Reviewed Labs: ordered. Radiology: ordered.   Pt with multiple medical problems and comorbidities and  presenting today with a complaint that caries a high risk for morbidity and mortality.  Here today with complaint of cough, feeling dizzy and abdominal pain.  Patient's blood pressure today is low in the 70s and repeat with lying down is in the 90s.  Concern for pneumonia, hernia, splenomegaly, abdominal mass, obstruction.  Patient has had poor oral intake due to  anorexia.  She was wheezing at urgent care but received a nebulizer and reports that has improved.  Will give IV fluids for hypotension.  Imaging pending.  I independently interpreted patient's labs and lipase within normal limits, CMP with creatinine of 1.38 from her baseline of 6 9.  CBC within normal limits.   I have independently visualized and interpreted pt's images today. Chest x-ray without acute findings today.  CT of the abdomen pelvis is pending.  After fluid bolus patient's blood pressure improves.         Final Clinical Impression(s) / ED Diagnoses Final diagnoses:  None    Rx / DC Orders ED Discharge Orders     None         Blanchie Dessert, MD 08/12/22 1550

## 2022-08-13 ENCOUNTER — Inpatient Hospital Stay (HOSPITAL_COMMUNITY): Payer: 59

## 2022-08-13 DIAGNOSIS — J189 Pneumonia, unspecified organism: Secondary | ICD-10-CM | POA: Diagnosis not present

## 2022-08-13 DIAGNOSIS — R7989 Other specified abnormal findings of blood chemistry: Secondary | ICD-10-CM

## 2022-08-13 DIAGNOSIS — I959 Hypotension, unspecified: Secondary | ICD-10-CM | POA: Diagnosis not present

## 2022-08-13 DIAGNOSIS — S301XXA Contusion of abdominal wall, initial encounter: Secondary | ICD-10-CM

## 2022-08-13 DIAGNOSIS — R079 Chest pain, unspecified: Secondary | ICD-10-CM

## 2022-08-13 DIAGNOSIS — K219 Gastro-esophageal reflux disease without esophagitis: Secondary | ICD-10-CM | POA: Diagnosis not present

## 2022-08-13 LAB — HIV ANTIBODY (ROUTINE TESTING W REFLEX): HIV Screen 4th Generation wRfx: NONREACTIVE

## 2022-08-13 LAB — CBC WITH DIFFERENTIAL/PLATELET
Abs Immature Granulocytes: 0.07 10*3/uL (ref 0.00–0.07)
Basophils Absolute: 0 10*3/uL (ref 0.0–0.1)
Basophils Relative: 0 %
Eosinophils Absolute: 0 10*3/uL (ref 0.0–0.5)
Eosinophils Relative: 0 %
HCT: 29.1 % — ABNORMAL LOW (ref 36.0–46.0)
Hemoglobin: 9.6 g/dL — ABNORMAL LOW (ref 12.0–15.0)
Immature Granulocytes: 1 %
Lymphocytes Relative: 13 %
Lymphs Abs: 1.4 10*3/uL (ref 0.7–4.0)
MCH: 27.7 pg (ref 26.0–34.0)
MCHC: 33 g/dL (ref 30.0–36.0)
MCV: 83.9 fL (ref 80.0–100.0)
Monocytes Absolute: 0.7 10*3/uL (ref 0.1–1.0)
Monocytes Relative: 7 %
Neutro Abs: 8.4 10*3/uL — ABNORMAL HIGH (ref 1.7–7.7)
Neutrophils Relative %: 79 %
Platelets: 147 10*3/uL — ABNORMAL LOW (ref 150–400)
RBC: 3.47 MIL/uL — ABNORMAL LOW (ref 3.87–5.11)
RDW: 13.6 % (ref 11.5–15.5)
WBC: 10.6 10*3/uL — ABNORMAL HIGH (ref 4.0–10.5)
nRBC: 0 % (ref 0.0–0.2)

## 2022-08-13 LAB — COMPREHENSIVE METABOLIC PANEL
ALT: 10 U/L (ref 0–44)
AST: 21 U/L (ref 15–41)
Albumin: 2.4 g/dL — ABNORMAL LOW (ref 3.5–5.0)
Alkaline Phosphatase: 48 U/L (ref 38–126)
Anion gap: 11 (ref 5–15)
BUN: 16 mg/dL (ref 6–20)
CO2: 23 mmol/L (ref 22–32)
Calcium: 8 mg/dL — ABNORMAL LOW (ref 8.9–10.3)
Chloride: 101 mmol/L (ref 98–111)
Creatinine, Ser: 1.2 mg/dL — ABNORMAL HIGH (ref 0.44–1.00)
GFR, Estimated: 52 mL/min — ABNORMAL LOW (ref >60)
Glucose, Bld: 111 mg/dL — ABNORMAL HIGH (ref 70–99)
Potassium: 3.7 mmol/L (ref 3.5–5.1)
Sodium: 135 mmol/L (ref 135–145)
Total Bilirubin: 0.7 mg/dL (ref 0.3–1.2)
Total Protein: 5.8 g/dL — ABNORMAL LOW (ref 6.5–8.1)

## 2022-08-13 LAB — FOLATE: Folate: 19.1 ng/mL (ref >5.9)

## 2022-08-13 LAB — IRON AND TIBC
Iron: 9 ug/dL — ABNORMAL LOW (ref 28–170)
Saturation Ratios: 5 % — ABNORMAL LOW (ref 10.4–31.8)
TIBC: 192 ug/dL — ABNORMAL LOW (ref 250–450)
UIBC: 183 ug/dL

## 2022-08-13 LAB — PROCALCITONIN
Procalcitonin: 1.19 ng/mL
Procalcitonin: 1.04 ng/mL

## 2022-08-13 LAB — ECHOCARDIOGRAM COMPLETE
Area-P 1/2: 3.72 cm2
S' Lateral: 3.9 cm

## 2022-08-13 LAB — LACTIC ACID, PLASMA: Lactic Acid, Venous: 1 mmol/L (ref 0.5–1.9)

## 2022-08-13 LAB — D-DIMER, QUANTITATIVE: D-Dimer, Quant: 1.61 ug/mL-FEU — ABNORMAL HIGH (ref 0.00–0.50)

## 2022-08-13 LAB — TSH: TSH: 1.19 u[IU]/mL (ref 0.350–4.500)

## 2022-08-13 LAB — VITAMIN B12: Vitamin B-12: 1175 pg/mL — ABNORMAL HIGH (ref 180–914)

## 2022-08-13 LAB — SEDIMENTATION RATE: Sed Rate: 70 mm/hr — ABNORMAL HIGH (ref 0–22)

## 2022-08-13 LAB — FERRITIN: Ferritin: 143 ng/mL (ref 11–307)

## 2022-08-13 LAB — C-REACTIVE PROTEIN: CRP: 23.9 mg/dL — ABNORMAL HIGH (ref <1.0)

## 2022-08-13 MED ORDER — OSELTAMIVIR PHOSPHATE 75 MG PO CAPS
75.0000 mg | ORAL_CAPSULE | Freq: Two times a day (BID) | ORAL | Status: DC
  Administered 2022-08-13 – 2022-08-14 (×3): 75 mg via ORAL
  Filled 2022-08-13 (×3): qty 1

## 2022-08-13 MED ORDER — ACETAMINOPHEN 325 MG PO TABS
650.0000 mg | ORAL_TABLET | Freq: Four times a day (QID) | ORAL | Status: DC | PRN
  Administered 2022-08-13: 650 mg via ORAL
  Filled 2022-08-13: qty 2

## 2022-08-13 MED ORDER — BUDESONIDE 0.25 MG/2ML IN SUSP
0.2500 mg | Freq: Two times a day (BID) | RESPIRATORY_TRACT | Status: DC
  Administered 2022-08-13 – 2022-08-14 (×3): 0.25 mg via RESPIRATORY_TRACT
  Filled 2022-08-13 (×3): qty 2

## 2022-08-13 MED ORDER — HYDROCOD POLI-CHLORPHE POLI ER 10-8 MG/5ML PO SUER: 5.0000 mL | Freq: Two times a day (BID) | ORAL | Status: DC | PRN

## 2022-08-13 MED ORDER — METHYLPREDNISOLONE SODIUM SUCC 125 MG IJ SOLR
40.0000 mg | Freq: Two times a day (BID) | INTRAMUSCULAR | Status: DC
  Administered 2022-08-13 – 2022-08-14 (×3): 40 mg via INTRAVENOUS
  Filled 2022-08-13 (×3): qty 2

## 2022-08-13 MED ORDER — BENZONATATE 100 MG PO CAPS: 200.0000 mg | ORAL_CAPSULE | Freq: Three times a day (TID) | ORAL | Status: DC | PRN

## 2022-08-13 MED ORDER — IPRATROPIUM-ALBUTEROL 0.5-2.5 (3) MG/3ML IN SOLN
3.0000 mL | Freq: Four times a day (QID) | RESPIRATORY_TRACT | Status: DC
  Administered 2022-08-13 – 2022-08-14 (×4): 3 mL via RESPIRATORY_TRACT
  Filled 2022-08-13 (×4): qty 3

## 2022-08-13 MED ORDER — ALBUTEROL SULFATE (2.5 MG/3ML) 0.083% IN NEBU: 2.5000 mg | INHALATION_SOLUTION | RESPIRATORY_TRACT | Status: DC | PRN

## 2022-08-13 NOTE — Progress Notes (Signed)
BLE venous duplex has been completed.   Results can be found under chart review under CV PROC. 08/13/2022 4:27 PM Enoch Moffa RVT, RDMS

## 2022-08-13 NOTE — Progress Notes (Addendum)
PROGRESS NOTE        PATIENT DETAILS Name: Caroline Harvey Age: 62 y.o. Sex: female Date of Birth: 11-22-1960 Admit Date: 08/12/2022 Admitting Physician Caroline Boll, MD [id number], Caroline Shaggy, MD  Brief Summary: Patient is a 62 y.o.  female with history of HTN, asthma, GERD-who presented with 4-5-day history of cough, upper abdominal pain-she was found to have influenza A infection with PNA, rectus sheath hematoma and subsequently admitted to the hospitalist service.  Significant events: 3/18>> admit to [facility]-influenza/PNA/rectus sheath hematoma  Significant studies: 3/18>> CXR: Mid/lower lung predominant interstitial opacities 3/18>> CT abdomen/pelvis: Rectus sheath hematoma, left greater than right groundglass opacities-multifocal pneumonia   Significant microbiology data: 3/18>> influenza A PCR: Positive 3/18>> COVID/influenza B/RSV PCR: Negative 3/18>> respiratory virus panel: Negative 3/18>> blood culture: No growth  Procedures: None  Consults: None  Subjective: Feels better-no major issues overnight.  Stable on 1-2 L of oxygen.  Some coughing spells continue.  Objective: Vitals: Blood pressure (!) 96/56, pulse 77, temperature 99.4 F (37.4 C), temperature source Oral, resp. rate 20, last menstrual period 12/14/2010, SpO2 100 %.   Exam: Gen Exam:Alert awake-not in any distress HEENT:atraumatic, normocephalic Chest: Prolonged expiration-coarse rhonchi all over. CVS:S1S2 regular Abdomen:soft non tender, non distended Extremities:no edema Neurology: Non focal Skin: no rash  Pertinent Labs/Radiology:    Latest Ref Rng & Units 08/13/2022    2:59 AM 08/12/2022   10:49 AM 02/14/2020    4:27 AM  CBC  WBC 4.0 - 10.5 K/uL 10.6  9.2  6.9   Hemoglobin 12.0 - 15.0 g/dL 9.6  11.6  12.7   Hematocrit 36.0 - 46.0 % 29.1  37.3  39.3   Platelets 150 - 400 K/uL 147  196  243     Lab Results  Component Value Date   NA 135 08/13/2022   K 3.7  08/13/2022   CL 101 08/13/2022   CO2 23 08/13/2022      Assessment/Plan: Acute hypoxic respiratory failure Probable influenza A PNA with superimposed bacterial PNA Bronchial asthma exacerbation Significant wheezing this morning-on minimal amount of oxygen Add Tamiflu Add steroids/scheduled bronchodilators Continue Rocephin/Zithromax-suspected superimposed bacterial infection-as procalcitonin elevated. Although D-dimer minimally elevated-very low suspicion for VTE-do not think she needs a VQ scan-I have discontinued the study.  Admitting MD has ordered  Doppler/echo-this will be followed.  Rectus sheath hematoma Likely spontaneous hematoma in the setting of coughing spells Add antitussives Hopefully treating asthma exacerbation will minimize coughing spells  AKI Mild Likely hemodynamically mediated-repeat electrolytes tomorrow  HTN BP soft Continue to hold all antihypertensives  GERD PPI  Hyponatremia Mild Stable for observation  Normcytic Anemia Likely 2/2 acute illness/IVF dilution Watch closely-mild drop in Hb  Obesity: Estimated body mass index is 36.86 kg/m as calculated from the following:   Height as of 04/05/22: 5' 8.5" (1.74 m).   Weight as of 04/05/22: 111.6 kg.   Code status:   Code Status: Full Code   DVT Prophylaxis: SCDs Start: 08/12/22 2041    Family Communication: None at bedside  Disposition Plan: Status is: Inpatient Remains inpatient appropriate because: Severity of illness   Planned Discharge Destination:Home in 2 days   Diet: Diet Order             Diet regular Room service appropriate? Yes; Fluid consistency: Thin  Diet effective now  Antimicrobial agents: Anti-infectives (From admission, onward)    Start     Dose/Rate Route Frequency Ordered Stop   08/13/22 2000  azithromycin (ZITHROMAX) 500 mg in sodium chloride 0.9 % 250 mL IVPB        500 mg 250 mL/hr over 60 Minutes Intravenous Every 24 hours  08/12/22 2045 08/17/22 1959   08/13/22 1000  oseltamivir (TAMIFLU) capsule 75 mg        75 mg Oral 2 times daily 08/13/22 0644 08/18/22 0959   08/12/22 2100  cefTRIAXone (ROCEPHIN) 2 g in sodium chloride 0.9 % 100 mL IVPB        2 g 200 mL/hr over 30 Minutes Intravenous Every 24 hours 08/12/22 2045 08/17/22 2044   08/12/22 1915  cefTRIAXone (ROCEPHIN) 1 g in sodium chloride 0.9 % 100 mL IVPB  Status:  Discontinued        1 g 200 mL/hr over 30 Minutes Intravenous  Once 08/12/22 1901 08/12/22 2130   08/12/22 1915  azithromycin (ZITHROMAX) 500 mg in sodium chloride 0.9 % 250 mL IVPB        500 mg 250 mL/hr over 60 Minutes Intravenous  Once 08/12/22 1901 08/12/22 2059        MEDICATIONS: Scheduled Meds:  budesonide (PULMICORT) nebulizer solution  0.25 mg Nebulization BID   ipratropium-albuterol  3 mL Nebulization Q6H   methylPREDNISolone (SOLU-MEDROL) injection  40 mg Intravenous Q12H   oseltamivir  75 mg Oral BID   pantoprazole  40 mg Oral Daily   Continuous Infusions:  sodium chloride 100 mL/hr at 08/12/22 2230   azithromycin     cefTRIAXone (ROCEPHIN)  IV     PRN Meds:.acetaminophen, albuterol, benzonatate, chlorpheniramine-HYDROcodone   I have personally reviewed following labs and imaging studies  LABORATORY DATA: CBC: Recent Labs  Lab 08/12/22 1049 08/13/22 0259  WBC 9.2 10.6*  NEUTROABS  --  8.4*  HGB 11.6* 9.6*  HCT 37.3 29.1*  MCV 86.9 83.9  PLT 196 147*    Basic Metabolic Panel: Recent Labs  Lab 08/12/22 1049 08/13/22 0259  NA 133* 135  K 3.7 3.7  CL 100 101  CO2 23 23  GLUCOSE 116* 111*  BUN 12 16  CREATININE 1.38* 1.20*  CALCIUM 8.6* 8.0*    GFR: CrCl cannot be calculated (Unknown ideal weight.).  Liver Function Tests: Recent Labs  Lab 08/12/22 1049 08/13/22 0259  AST 28 21  ALT 9 10  ALKPHOS 65 48  BILITOT 0.8 0.7  PROT 7.4 5.8*  ALBUMIN 3.2* 2.4*   Recent Labs  Lab 08/12/22 1049  LIPASE 33   No results for input(s):  "AMMONIA" in the last 168 hours.  Coagulation Profile: Recent Labs  Lab 08/12/22 1940  INR 1.1    Cardiac Enzymes: No results for input(s): "CKTOTAL", "CKMB", "CKMBINDEX", "TROPONINI" in the last 168 hours.  BNP (last 3 results) No results for input(s): "PROBNP" in the last 8760 hours.  Lipid Profile: No results for input(s): "CHOL", "HDL", "LDLCALC", "TRIG", "CHOLHDL", "LDLDIRECT" in the last 72 hours.  Thyroid Function Tests: Recent Labs    08/12/22 2303  TSH 1.190    Anemia Panel: Recent Labs    08/12/22 2303  VITAMINB12 1,175*  FOLATE 19.1  FERRITIN 143  TIBC 192*  IRON 9*  RETICCTPCT 0.5    Urine analysis:    Component Value Date/Time   COLORURINE AMBER (A) 08/12/2022 1937   APPEARANCEUR CLOUDY (A) 08/12/2022 1937   LABSPEC >1.046 (H) 08/12/2022 1937   PHURINE 5.0  08/12/2022 1937   GLUCOSEU NEGATIVE 08/12/2022 1937   HGBUR SMALL (A) 08/12/2022 1937   HGBUR trace-intact 12/18/2009 0908   BILIRUBINUR NEGATIVE 08/12/2022 1937   KETONESUR NEGATIVE 08/12/2022 1937   PROTEINUR 100 (A) 08/12/2022 1937   UROBILINOGEN 0.2 10/03/2014 1623   NITRITE NEGATIVE 08/12/2022 1937   LEUKOCYTESUR NEGATIVE 08/12/2022 1937    Sepsis Labs: Lactic Acid, Venous    Component Value Date/Time   LATICACIDVEN 1.0 08/12/2022 2352    MICROBIOLOGY: Recent Results (from the past 240 hour(s))  Culture, blood (Routine X 2) w Reflex to ID Panel     Status: None (Preliminary result)   Collection Time: 08/12/22  7:35 PM   Specimen: BLOOD LEFT ARM  Result Value Ref Range Status   Specimen Description BLOOD LEFT ARM  Final   Special Requests   Final    BOTTLES DRAWN AEROBIC AND ANAEROBIC Blood Culture results may not be optimal due to an inadequate volume of blood received in culture bottles   Culture   Final    NO GROWTH < 12 HOURS Performed at [facility], [place] [address], [place], [place] [address]    Report Status PENDING  Incomplete  Culture, blood (Routine X 2) w  Reflex to ID Panel     Status: None (Preliminary result)   Collection Time: 08/12/22  7:40 PM   Specimen: BLOOD RIGHT ARM  Result Value Ref Range Status   Specimen Description BLOOD RIGHT ARM  Final   Special Requests   Final    BOTTLES DRAWN AEROBIC AND ANAEROBIC Blood Culture results may not be optimal due to an inadequate volume of blood received in culture bottles   Culture   Final    NO GROWTH < 12 HOURS Performed at [facility], [place] [address] [place], [place] [address]    Report Status PENDING  Incomplete  Resp panel by RT-PCR (RSV, Flu A&B, Covid) Anterior Nasal Swab     Status: Abnormal   Collection Time: 08/12/22  8:50 PM   Specimen: Anterior Nasal Swab  Result Value Ref Range Status   SARS Coronavirus 2 by RT PCR NEGATIVE NEGATIVE Final   Influenza A by PCR POSITIVE (A) NEGATIVE Final   Influenza B by PCR NEGATIVE NEGATIVE Final    Comment: (NOTE) The Xpert Xpress SARS-CoV-2/FLU/RSV plus assay is intended as an aid in the diagnosis of influenza from Nasopharyngeal swab specimens and should not be used as a sole basis for treatment. Nasal washings and aspirates are unacceptable for Xpert Xpress SARS-CoV-2/FLU/RSV testing.  Fact Sheet for Patients: EntrepreneurPulse.com.au  Fact Sheet for Healthcare Providers: IncredibleEmployment.be  This test is not yet approved or cleared by the [place] FDA and has been authorized for detection and/or diagnosis of SARS-CoV-2 by FDA under an Emergency Use Authorization (EUA). This EUA will remain in effect (meaning this test can be used) for the duration of the COVID-19 declaration under Section 564(b)(1) of the Act, 21 U.S.C. section 360bbb-3(b)(1), unless the authorization is terminated or revoked.     Resp Syncytial Virus by PCR NEGATIVE NEGATIVE Final    Comment: (NOTE) Fact Sheet for Patients: EntrepreneurPulse.com.au  Fact Sheet for Healthcare  Providers: IncredibleEmployment.be  This test is not yet approved or cleared by the [place] FDA and has been authorized for detection and/or diagnosis of SARS-CoV-2 by FDA under an Emergency Use Authorization (EUA). This EUA will remain in effect (meaning this test can be used) for the duration of the COVID-19 declaration under  Section 564(b)(1) of the Act, 21 U.S.C. section 360bbb-3(b)(1), unless the authorization is terminated or revoked.  Performed at [facility], [place] [address], [place], [place] [address]   Respiratory (~20 pathogens) panel by PCR     Status: Abnormal   Collection Time: 08/12/22  8:50 PM   Specimen: Nasopharyngeal Swab; Respiratory  Result Value Ref Range Status   Adenovirus NOT DETECTED NOT DETECTED Final   Coronavirus 229E NOT DETECTED NOT DETECTED Final    Comment: (NOTE) The Coronavirus on the Respiratory Panel, DOES NOT test for the novel  Coronavirus (2019 nCoV)    Coronavirus HKU1 NOT DETECTED NOT DETECTED Final   Coronavirus NL63 NOT DETECTED NOT DETECTED Final   Coronavirus OC43 NOT DETECTED NOT DETECTED Final   Metapneumovirus NOT DETECTED NOT DETECTED Final   Rhinovirus / Enterovirus NOT DETECTED NOT DETECTED Final   Influenza A DETECTED (A) NOT DETECTED Final    Comment: Referred to [facility] in [place], [place] for serotyping.   Influenza B NOT DETECTED NOT DETECTED Final   Parainfluenza Virus 1 NOT DETECTED NOT DETECTED Final   Parainfluenza Virus 2 NOT DETECTED NOT DETECTED Final   Parainfluenza Virus 3 NOT DETECTED NOT DETECTED Final   Parainfluenza Virus 4 NOT DETECTED NOT DETECTED Final   Respiratory Syncytial Virus NOT DETECTED NOT DETECTED Final   Bordetella pertussis NOT DETECTED NOT DETECTED Final   Bordetella Parapertussis NOT DETECTED NOT DETECTED Final   Chlamydophila pneumoniae NOT DETECTED NOT DETECTED Final   Mycoplasma pneumoniae NOT DETECTED NOT DETECTED Final    Comment: Performed at [facility], [place] [address], [place], [place] [address]  Culture, blood (routine x 2) Call MD if unable to obtain prior to antibiotics being given     Status: None (Preliminary result)   Collection Time: 08/12/22 11:03 PM   Specimen: BLOOD RIGHT ARM  Result Value Ref Range Status   Specimen Description BLOOD RIGHT ARM  Final   Special Requests   Final    BOTTLES DRAWN AEROBIC AND ANAEROBIC Blood Culture results may not be optimal due to an inadequate volume of blood received in culture bottles   Culture   Final    NO GROWTH < 12 HOURS Performed at [facility] [address], [place], [place] [address]    Report Status PENDING  Incomplete  Culture, blood (routine x 2) Call MD if unable to obtain prior to antibiotics being given     Status: None (Preliminary result)   Collection Time: 08/12/22 11:03 PM   Specimen: BLOOD LEFT ARM  Result Value Ref Range Status   Specimen Description BLOOD LEFT ARM  Final   Special Requests   Final    BOTTLES DRAWN AEROBIC AND ANAEROBIC Blood Culture results may not be optimal due to an inadequate volume of blood received in culture bottles   Culture   Final    NO GROWTH < 12 HOURS Performed at [facility] [address] [place], [place] [address]    Report Status PENDING  Incomplete    RADIOLOGY STUDIES/RESULTS: CT ABDOMEN PELVIS W CONTRAST  Result Date: 08/12/2022 CLINICAL DATA:  Left-sided abdominal pain with cough EXAM: CT ABDOMEN AND PELVIS WITH CONTRAST TECHNIQUE: Multidetector CT imaging of the abdomen and pelvis was performed using the standard protocol following bolus administration of intravenous contrast. RADIATION DOSE REDUCTION: This exam was performed according to the departmental dose-optimization program which includes automated exposure control, adjustment of the mA and/or kV  according to patient size and/or use of iterative reconstruction technique. CONTRAST:  59mL OMNIPAQUE IOHEXOL 350 MG/ML SOLN COMPARISON:  None  Available. FINDINGS: Lower chest: Left-greater-than-right ground-glass opacities with focal consolidations in the lingula and left lower lobe. Trace bilateral pleural effusions. Partially imaged heart size is normal. Hepatobiliary: No focal hepatic lesions. No intra or extrahepatic biliary ductal dilation. Normal gallbladder. Pancreas: No focal lesions or main ductal dilation. Spleen: Normal in size without focal abnormality. Adrenals/Urinary Tract: No adrenal nodules. No suspicious renal mass, calculi or hydronephrosis. Bilateral upper pole simple cysts. No specific follow-up imaging recommended. Subcentimeter hypoattenuating focus in the lower pole right kidney, too small to characterize but also likely cysts. Mild circumferential mural thickening of the urinary bladder. Stomach/Bowel: Normal appearance of the stomach. No evidence of bowel wall thickening, distention, or inflammatory changes. Normal appendix. Vascular/Lymphatic: Aortic atherosclerosis. No enlarged abdominal or pelvic lymph nodes. Reproductive: No adnexal masses. Other: Small volume free fluid. Musculoskeletal: No acute or abnormal lytic or blastic osseous lesions. Asymmetrically expanded left rectus abdominis muscle containing a rounded focus of hyperattenuation measuring 5.1 x 3.9 cm (4:40). Overlying subcutaneous soft tissue stranding. IMPRESSION: 1. Asymmetrically expanded left rectus abdominis muscle containing a rounded focus of hyperattenuation measuring up to 5.1 cm. Overlying subcutaneous soft tissue stranding. Findings are suggestive of a rectus sheath hematoma. Recommend continued clinical follow-up to resolution to exclude underlying mass lesion. 2. Left-greater-than-right ground-glass opacities with focal consolidations in the lingula and left lower lobe, likely multifocal pneumonia. 3. Trace bilateral pleural effusions. 4. Mild circumferential mural thickening of the urinary bladder. Correlate with urinalysis to exclude cystitis. 5.  Aortic Atherosclerosis (ICD10-I70.0). Electronically Signed   By: Darrin Nipper M.D.   On: 08/12/2022 17:50   DG Chest Port 1 View  Result Date: 08/12/2022 CLINICAL DATA:  Cough and left-sided chest pain EXAM: PORTABLE CHEST 1 VIEW COMPARISON:  Chest x-ray dated February 22, 2020 FINDINGS: Cardiac contours are within normal limits AP technique. Prominence of the bilateral pulmonary arteries, unchanged when compared with prior exam. Mid and lower lung predominant interstitial opacities, increased when compared with the prior. No large pleural effusion. No evidence of pneumothorax. IMPRESSION: 1. Mid and lower lung predominant interstitial opacities, likely due to pulmonary edema. 2. Prominence of the bilateral pulmonary arteries, unchanged when compared with prior exam, findings can be seen in the setting of pulmonary hypertension. Electronically Signed   By: Yetta Glassman M.D.   On: 08/12/2022 12:51     LOS: 1 day   Oren Binet, MD  [facility]    To contact the attending provider between 7A-7P or the covering provider during after hours 7P-7A, please log into the web site [email or web address] and access using universal [place] password for that web site. If you do not have the password, please call the hospital operator.  08/13/2022, 10:17 AM   Pat, Hyponatremia

## 2022-08-13 NOTE — Progress Notes (Signed)
Pt IV running Zithromax noted to be swollen, tender, warm, IV removed and Abx started on other IV, pt again stated that it was burning, abx stopped and J. Olena Heckle, NP notified. Olena Heckle said to try running at half the ordered rate (at 12mL/hr) pt refused due to wanting to rest

## 2022-08-13 NOTE — Progress Notes (Signed)
  Echocardiogram 2D Echocardiogram has been performed.  Wynelle Link 08/13/2022, 2:35 PM

## 2022-08-14 ENCOUNTER — Other Ambulatory Visit (HOSPITAL_COMMUNITY): Payer: Self-pay

## 2022-08-14 DIAGNOSIS — R0902 Hypoxemia: Secondary | ICD-10-CM | POA: Diagnosis not present

## 2022-08-14 DIAGNOSIS — S301XXA Contusion of abdominal wall, initial encounter: Secondary | ICD-10-CM | POA: Diagnosis not present

## 2022-08-14 DIAGNOSIS — I959 Hypotension, unspecified: Secondary | ICD-10-CM | POA: Diagnosis not present

## 2022-08-14 DIAGNOSIS — J189 Pneumonia, unspecified organism: Secondary | ICD-10-CM | POA: Diagnosis not present

## 2022-08-14 LAB — BASIC METABOLIC PANEL
Anion gap: 7 (ref 5–15)
BUN: 14 mg/dL (ref 6–20)
CO2: 24 mmol/L (ref 22–32)
Calcium: 8.1 mg/dL — ABNORMAL LOW (ref 8.9–10.3)
Chloride: 104 mmol/L (ref 98–111)
Creatinine, Ser: 0.8 mg/dL (ref 0.44–1.00)
GFR, Estimated: >60 mL/min (ref >60)
Glucose, Bld: 176 mg/dL — ABNORMAL HIGH (ref 70–99)
Potassium: 4.6 mmol/L (ref 3.5–5.1)
Sodium: 135 mmol/L (ref 135–145)

## 2022-08-14 LAB — CBC
HCT: 29.3 % — ABNORMAL LOW (ref 36.0–46.0)
Hemoglobin: 9.6 g/dL — ABNORMAL LOW (ref 12.0–15.0)
MCH: 27.7 pg (ref 26.0–34.0)
MCHC: 32.8 g/dL (ref 30.0–36.0)
MCV: 84.4 fL (ref 80.0–100.0)
Platelets: 183 10*3/uL (ref 150–400)
RBC: 3.47 MIL/uL — ABNORMAL LOW (ref 3.87–5.11)
RDW: 13.6 % (ref 11.5–15.5)
WBC: 5.6 10*3/uL (ref 4.0–10.5)
nRBC: 0 % (ref 0.0–0.2)

## 2022-08-14 MED ORDER — BENZONATATE 200 MG PO CAPS
200.0000 mg | ORAL_CAPSULE | Freq: Three times a day (TID) | ORAL | 0 refills | Status: AC | PRN
  Filled 2022-08-14 (×2): qty 20, 7d supply, fill #0

## 2022-08-14 MED ORDER — OSELTAMIVIR PHOSPHATE 75 MG PO CAPS
75.0000 mg | ORAL_CAPSULE | Freq: Two times a day (BID) | ORAL | 0 refills | Status: AC
  Filled 2022-08-14: qty 7, 4d supply, fill #0

## 2022-08-14 MED ORDER — CEFDINIR 300 MG PO CAPS: 300.0000 mg | ORAL_CAPSULE | Freq: Two times a day (BID) | ORAL | 0 refills | Status: AC

## 2022-08-14 MED ORDER — AZITHROMYCIN 500 MG PO TABS
500.0000 mg | ORAL_TABLET | Freq: Every day | ORAL | 0 refills | Status: AC
  Filled 2022-08-14: qty 3, 3d supply, fill #0

## 2022-08-14 MED ORDER — PREDNISONE 10 MG PO TABS
ORAL_TABLET | ORAL | 0 refills | Status: AC
  Filled 2022-08-14: qty 10, 4d supply, fill #0

## 2022-08-14 MED ORDER — PANTOPRAZOLE SODIUM 40 MG PO TBEC
40.0000 mg | DELAYED_RELEASE_TABLET | Freq: Every day | ORAL | 0 refills | Status: AC
  Filled 2022-08-14: qty 30, 30d supply, fill #0

## 2022-08-14 MED ORDER — LISINOPRIL 20 MG PO TABS
20.0000 mg | ORAL_TABLET | Freq: Every day | ORAL | Status: DC
  Administered 2022-08-14: 20 mg via ORAL
  Filled 2022-08-14: qty 1

## 2022-08-14 NOTE — Discharge Summary (Signed)
PATIENT DETAILS Name: Aydrian Boullion Age: 62 y.o. Sex: female Date of Birth: 17-Jan-1961 MRN: [id number]. Admitting Physician: Clance Boll, MD [id number], Bebe Shaggy, MD  Admit Date: 08/12/2022 Discharge date: 08/14/2022  Recommendations for Outpatient Follow-up:  Follow up with PCP in 1-2 weeks Please obtain CMP/CBC in one week  Admitted From:  Home  Disposition: Home   Discharge Condition: good  CODE STATUS:   Code Status: Full Code   Diet recommendation:  Diet Order             Diet - low sodium heart healthy           Diet regular Room service appropriate? Yes; Fluid consistency: Thin  Diet effective now                    Brief Summary: Patient is a 62 y.o.  female with history of HTN, asthma, GERD-who presented with 4-5-day history of cough, upper abdominal pain-she was found to have influenza A infection with PNA, rectus sheath hematoma and subsequently admitted to the hospitalist service.   Significant events: 3/18>> admit to [facility]-influenza/PNA/rectus sheath hematoma   Significant studies: 3/18>> CXR: Mid/lower lung predominant interstitial opacities 3/18>> CT abdomen/pelvis: Rectus sheath hematoma, left greater than right groundglass opacities-multifocal pneumonia  3/19>> echo: EF 55-60% 3/19>> bilateral lower extremity Doppler: No DVT.   Significant microbiology data: 3/18>> influenza A PCR: Positive 3/18>> COVID/influenza B/RSV PCR: Negative 3/18>> respiratory virus panel: Negative 3/18>> blood culture: No growth  Brief Hospital Course: Acute hypoxic respiratory failure Probable influenza A PNA with superimposed bacterial PNA Bronchial asthma exacerbation Significantly improved after initiation of Tamiflu/antibiotics/steroids/bronchodilators On room air this morning-requesting discharge as she feels much better Cough is now minimal-hardly any wheezing on exam-moving air well Will transition to oral antibiotics to complete a 5-day  course, continue Tamiflu x 5 days total Will put her on a few more days of tapering steroids She has her usual bronchodilator regimen at home which she will continue    Rectus sheath hematoma Likely spontaneous hematoma in the setting of coughing spells Hardly any pain today Hemoglobin stable Continue as needed antitussives Continue outpatient monitoring by PCP.   AKI Mild Likely hemodynamically mediated-resolved.  If   HTN BP initially soft-all antihypertensives held BP now significantly elevated-resume usual dosing of lisinopril    GERD PPI   Hyponatremia Mild Stable for observation   Normcytic Anemia Likely 2/2 acute illness/IVF dilution/rectus sheath hematoma Hemoglobin essentially unchanged compared to yesterday- Repeat CBC at PCPs office in 1 week   Obesity: Estimated body mass index is 36.86 kg/m as calculated from the following:   Height as of 04/05/22: 5' 8.5" (1.74 m).   Weight as of 04/05/22: 111.6 kg.    Discharge Diagnoses:  Principal Problem:   CAP (community acquired pneumonia)   Discharge Instructions:  Activity:  As tolerated  Discharge Instructions     Call MD for:  difficulty breathing, headache or visual disturbances   Complete by: As directed    Call MD for:  persistant nausea and vomiting   Complete by: As directed    Diet - low sodium heart healthy   Complete by: As directed    Discharge instructions   Complete by: As directed    Follow with Primary MD  Chow, Bebe Shaggy, MD in 1-2 weeks  Repeat two-view chest x-ray in 4 to 6 weeks to document resolution of infiltrates  Please get a complete blood count and chemistry panel checked by your Primary  MD at your next visit, and again as instructed by your Primary MD.  Get Medicines reviewed and adjusted: Please take all your medications with you for your next visit with your Primary MD  Laboratory/radiological data: Please request your Primary MD to go over all hospital tests and  procedure/radiological results at the follow up, please ask your Primary MD to get all Hospital records sent to his/her office.  In some cases, they will be blood work, cultures and biopsy results pending at the time of your discharge. Please request that your primary care M.D. follows up on these results.  Also Note the following: If you experience worsening of your admission symptoms, develop shortness of breath, life threatening emergency, suicidal or homicidal thoughts you must seek medical attention immediately by calling 911 or calling your MD immediately  if symptoms less severe.  You must read complete instructions/literature along with all the possible adverse reactions/side effects for all the Medicines you take and that have been prescribed to you. Take any new Medicines after you have completely understood and accpet all the possible adverse reactions/side effects.   Do not drive when taking Pain medications or sleeping medications (Benzodaizepines)  Do not take more than prescribed Pain, Sleep and Anxiety Medications. It is not advisable to combine anxiety,sleep and pain medications without talking with your primary care practitioner  Special Instructions: If you have smoked or chewed Tobacco  in the last 2 yrs please stop smoking, stop any regular Alcohol  and or any Recreational drug use.  Wear Seat belts while driving.  Please note: You were cared for by a hospitalist during your hospital stay. Once you are discharged, your primary care physician will handle any further medical issues. Please note that NO REFILLS for any discharge medications will be authorized once you are discharged, as it is imperative that you return to your primary care physician (or establish a relationship with a primary care physician if you do not have one) for your post hospital discharge needs so that they can reassess your need for medications and monitor your lab values.   Increase activity slowly    Complete by: As directed       Allergies as of 08/14/2022       Reactions   Hydrochlorothiazide Other (See Comments)   Amoxicillin Other (See Comments)   Numbness in arm and dizzy        Medication List     TAKE these medications    albuterol 108 (90 Base) MCG/ACT inhaler Commonly known as: VENTOLIN HFA Inhale 2 puffs into the lungs every 4 (four) hours as needed for wheezing or shortness of breath.   azithromycin 500 MG tablet Commonly known as: Zithromax Take 1 tablet (500 mg total) by mouth daily for 3 days. Take 1 tablet daily for 3 days.   benzonatate 200 MG capsule Commonly known as: TESSALON Take 1 capsule (200 mg total) by mouth 3 (three) times daily as needed (mild cough).   budesonide 0.5 MG/2ML nebulizer solution Commonly known as: PULMICORT Add 1mL (1 vial) of the budesonide into your 240 mL of saline irrigation bottle and irrigate your sinuses twice daily.   cefdinir 300 MG capsule Commonly known as: OMNICEF Take 1 capsule (300 mg total) by mouth 2 (two) times daily for 3 days.   gabapentin 300 MG capsule Commonly known as: NEURONTIN Take 300 mg by mouth 3 (three) times daily.   lisinopril 20 MG tablet Commonly known as: ZESTRIL Take 1 tablet (20 mg  total) by mouth daily.   oseltamivir 75 MG capsule Commonly known as: TAMIFLU Take 1 capsule (75 mg total) by mouth 2 (two) times daily for 7 doses.   pantoprazole 40 MG tablet Commonly known as: PROTONIX Take 1 tablet (40 mg total) by mouth daily.   predniSONE 10 MG tablet Commonly known as: DELTASONE Take 40 mg daily for 1 day, 30 mg daily for 1 day, 20 mg daily for 1 days,10 mg daily for 1 day, then stop   Symbicort 160-4.5 MCG/ACT inhaler Generic drug: budesonide-formoterol Inhale 2 puffs into the lungs 2 (two) times daily.        Follow-up Information     Chow, Bebe Shaggy, MD. Schedule an appointment as soon as possible for a visit in 1 week(s).   Specialty: Internal Medicine Contact  information: [place] [address] [phone]                Allergies  Allergen Reactions   Hydrochlorothiazide Other (See Comments)   Amoxicillin Other (See Comments)    Numbness in arm and dizzy     Other Procedures/Studies: VAS [place] LOWER EXTREMITY VENOUS (DVT)  Result Date: 08/13/2022  Lower Venous DVT Study Patient Name:  MYKIRA CHISOLM  Date of Exam:   08/13/2022 Medical Rec #: [id number]        Accession #:    [id number] Date of Birth: 24-Dec-1960       Patient Gender: F Patient Age:   58 years Exam Location:  [facility] Procedure:      VAS [place] LOWER EXTREMITY VENOUS (DVT) Referring Phys: Myles Rosenthal --------------------------------------------------------------------------------  Indications: Elevated D-Dimer (1.61).  Limitations: Body habitus, poor ultrasound/tissue interface and pain intolerance. Comparison Study: No previous exams Performing Technologist: Jody Hill RVT, RDMS  Examination Guidelines: A complete evaluation includes B-mode imaging, spectral Doppler, color Doppler, and power Doppler as needed of all accessible portions of each vessel. Bilateral testing is considered an integral part of a complete examination. Limited examinations for reoccurring indications may be performed as noted. The reflux portion of the exam is performed with the patient in reverse Trendelenburg.  +--------+---------------+---------+-----------+----------+--------------------+ RIGHT   CompressibilityPhasicitySpontaneityPropertiesThrombus Aging       +--------+---------------+---------+-----------+----------+--------------------+ CFV     Full           Yes      Yes                                       +--------+---------------+---------+-----------+----------+--------------------+ SFJ     Full                                                              +--------+---------------+---------+-----------+----------+--------------------+ FV Prox  Full           Yes      Yes                                       +--------+---------------+---------+-----------+----------+--------------------+ FV Mid  Full           Yes      Yes                                       +--------+---------------+---------+-----------+----------+--------------------+  FV                     Yes      Yes                  patent by            Distal                                               color/doppler        +--------+---------------+---------+-----------+----------+--------------------+ PFV     Full                                                              +--------+---------------+---------+-----------+----------+--------------------+ POP     Full           Yes      Yes                                       +--------+---------------+---------+-----------+----------+--------------------+ PTV     Full                                                              +--------+---------------+---------+-----------+----------+--------------------+ PERO    Full                                                              +--------+---------------+---------+-----------+----------+--------------------+   +--------+---------------+---------+-----------+----------+--------------------+ LEFT    CompressibilityPhasicitySpontaneityPropertiesThrombus Aging       +--------+---------------+---------+-----------+----------+--------------------+ CFV     Full           Yes      Yes                                       +--------+---------------+---------+-----------+----------+--------------------+ SFJ     Full                                                              +--------+---------------+---------+-----------+----------+--------------------+ FV Prox Full           Yes      Yes                                       +--------+---------------+---------+-----------+----------+--------------------+ FV Mid   Full           Yes  Yes                                       +--------+---------------+---------+-----------+----------+--------------------+ FV                     Yes      Yes                  patent by            Distal                                               color/doppler        +--------+---------------+---------+-----------+----------+--------------------+ PFV     Full                                                              +--------+---------------+---------+-----------+----------+--------------------+ POP     Full           Yes      Yes                                       +--------+---------------+---------+-----------+----------+--------------------+ PTV     Full                                                              +--------+---------------+---------+-----------+----------+--------------------+ PERO    Full                                                              +--------+---------------+---------+-----------+----------+--------------------+     Summary: BILATERAL: - No evidence of deep vein thrombosis seen in the lower extremities, bilaterally. -No evidence of popliteal cyst, bilaterally.   *See table(s) above for measurements and observations. Electronically signed by Deitra Mayo MD on 08/13/2022 at 4:41:22 PM.    Final    ECHOCARDIOGRAM COMPLETE  Result Date: 08/13/2022    ECHOCARDIOGRAM REPORT   Patient Name:   Tahani Latulippe Date of Exam: 08/13/2022 Medical Rec #:  [id number]       Height:       68.5 in Accession #:    [id number]      Weight:       246.0 lb Date of Birth:  [birth year]      BSA:          2.244 m Patient Age:    61 years        BP:           110/60 mmHg Patient Gender: F  HR:           70 bpm. Exam Location:  Inpatient Procedure: 2D Echo, Cardiac Doppler and Color Doppler Indications:    Chest Pain R07.9  History:        Patient has no prior history of Echocardiogram examinations.                  Signs/Symptoms:Chest Pain; Risk Factors:Non-Smoker and                 Hypertension.  Sonographer:    Greer Pickerel Referring Phys: Clance Boll  Sonographer Comments: Image acquisition challenging due to patient body habitus and Image acquisition challenging due to respiratory motion. IMPRESSIONS  1. Left ventricular ejection fraction, by estimation, is 55 to 60%. The left ventricle has normal function. The left ventricle has no regional wall motion abnormalities. Left ventricular diastolic parameters are consistent with Grade I diastolic dysfunction (impaired relaxation).  2. Right ventricular systolic function is normal. The right ventricular size is normal. There is normal pulmonary artery systolic pressure.  3. Left atrial size was mild to moderately dilated.  4. The mitral valve is normal in structure. No evidence of mitral valve regurgitation. No evidence of mitral stenosis.  5. The aortic valve is normal in structure. Aortic valve regurgitation is not visualized. No aortic stenosis is present.  6. The inferior vena cava is dilated in size with <50% respiratory variability, suggesting right atrial pressure of 15 mmHg. FINDINGS  Left Ventricle: Left ventricular ejection fraction, by estimation, is 55 to 60%. The left ventricle has normal function. The left ventricle has no regional wall motion abnormalities. The left ventricular internal cavity size was normal in size. There is  no left ventricular hypertrophy. Left ventricular diastolic parameters are consistent with Grade I diastolic dysfunction (impaired relaxation). Right Ventricle: The right ventricular size is normal. No increase in right ventricular wall thickness. Right ventricular systolic function is normal. There is normal pulmonary artery systolic pressure. The tricuspid regurgitant velocity is 2.60 m/s, and  with an assumed right atrial pressure of 8 mmHg, the estimated right ventricular systolic pressure is [id number] mmHg. Left Atrium:  Left atrial size was mild to moderately dilated. Right Atrium: Right atrial size was normal in size. Pericardium: There is no evidence of pericardial effusion. Mitral Valve: The mitral valve is normal in structure. No evidence of mitral valve regurgitation. No evidence of mitral valve stenosis. Tricuspid Valve: The tricuspid valve is normal in structure. Tricuspid valve regurgitation is not demonstrated. No evidence of tricuspid stenosis. Aortic Valve: The aortic valve is normal in structure. Aortic valve regurgitation is not visualized. No aortic stenosis is present. Pulmonic Valve: The pulmonic valve was normal in structure. Pulmonic valve regurgitation is not visualized. No evidence of pulmonic stenosis. Aorta: The aortic root is normal in size and structure. Venous: The inferior vena cava is dilated in size with less than 50% respiratory variability, suggesting right atrial pressure of 15 mmHg. IAS/Shunts: No atrial level shunt detected by color flow Doppler.  LEFT VENTRICLE PLAX 2D LVIDd:         5.60 cm   Diastology LVIDs:         3.90 cm   LV e' medial:    6.42 cm/s LV PW:         1.00 cm   LV E/e' medial:  14.9 LV IVS:        0.80 cm   LV e' lateral:   9.68 cm/s LVOT diam:  2.10 cm   LV E/e' lateral: 9.9 LV SV:         91 LV SV Index:   41 LVOT Area:     3.46 cm  RIGHT VENTRICLE RV S prime:     17.50 cm/s TAPSE (M-mode): 2.3 cm LEFT ATRIUM              Index        RIGHT ATRIUM           Index LA diam:        4.80 cm  2.14 cm/m   RA Area:     22.90 cm LA Vol (A2C):   129.0 ml 57.49 ml/m  RA Volume:   67.40 ml  30.04 ml/m LA Vol (A4C):   97.9 ml  43.63 ml/m LA Biplane Vol: 116.0 ml 51.70 ml/m  AORTIC VALVE LVOT Vmax:   114.00 cm/s LVOT Vmean:  77.400 cm/s LVOT VTI:    0.263 m  AORTA Ao Root diam: 3.30 cm Ao Asc diam:  3.90 cm MITRAL VALVE               TRICUSPID VALVE MV Area (PHT): 3.72 cm    TR Peak grad:   27.0 mmHg MV Decel Time: 204 msec    TR Vmax:        260.00 cm/s MV E velocity: 95.60 cm/s  MV A velocity: 89.10 cm/s  SHUNTS MV E/A ratio:  1.07        Systemic VTI:  0.26 m                            Systemic Diam: 2.10 cm Aditya Sabharwal Electronically signed by Hebert Soho Signature Date/Time: 08/13/2022/2:52:41 PM    Final    CT ABDOMEN PELVIS W CONTRAST  Result Date: 08/12/2022 CLINICAL DATA:  Left-sided abdominal pain with cough EXAM: CT ABDOMEN AND PELVIS WITH CONTRAST TECHNIQUE: Multidetector CT imaging of the abdomen and pelvis was performed using the standard protocol following bolus administration of intravenous contrast. RADIATION DOSE REDUCTION: This exam was performed according to the departmental dose-optimization program which includes automated exposure control, adjustment of the mA and/or kV according to patient size and/or use of iterative reconstruction technique. CONTRAST:  82mL OMNIPAQUE IOHEXOL 350 MG/ML SOLN COMPARISON:  None Available. FINDINGS: Lower chest: Left-greater-than-right ground-glass opacities with focal consolidations in the lingula and left lower lobe. Trace bilateral pleural effusions. Partially imaged heart size is normal. Hepatobiliary: No focal hepatic lesions. No intra or extrahepatic biliary ductal dilation. Normal gallbladder. Pancreas: No focal lesions or main ductal dilation. Spleen: Normal in size without focal abnormality. Adrenals/Urinary Tract: No adrenal nodules. No suspicious renal mass, calculi or hydronephrosis. Bilateral upper pole simple cysts. No specific follow-up imaging recommended. Subcentimeter hypoattenuating focus in the lower pole right kidney, too small to characterize but also likely cysts. Mild circumferential mural thickening of the urinary bladder. Stomach/Bowel: Normal appearance of the stomach. No evidence of bowel wall thickening, distention, or inflammatory changes. Normal appendix. Vascular/Lymphatic: Aortic atherosclerosis. No enlarged abdominal or pelvic lymph nodes. Reproductive: No adnexal masses. Other: Small volume  free fluid. Musculoskeletal: No acute or abnormal lytic or blastic osseous lesions. Asymmetrically expanded left rectus abdominis muscle containing a rounded focus of hyperattenuation measuring 5.1 x 3.9 cm (4:40). Overlying subcutaneous soft tissue stranding. IMPRESSION: 1. Asymmetrically expanded left rectus abdominis muscle containing a rounded focus of hyperattenuation measuring up to 5.1 cm. Overlying subcutaneous soft tissue stranding. Findings are  suggestive of a rectus sheath hematoma. Recommend continued clinical follow-up to resolution to exclude underlying mass lesion. 2. Left-greater-than-right ground-glass opacities with focal consolidations in the lingula and left lower lobe, likely multifocal pneumonia. 3. Trace bilateral pleural effusions. 4. Mild circumferential mural thickening of the urinary bladder. Correlate with urinalysis to exclude cystitis. 5. Aortic Atherosclerosis (ICD10-I70.0). Electronically Signed   By: Darrin Nipper M.D.   On: 08/12/2022 17:50   DG Chest Port 1 View  Result Date: 08/12/2022 CLINICAL DATA:  Cough and left-sided chest pain EXAM: PORTABLE CHEST 1 VIEW COMPARISON:  Chest x-ray dated February 22, 2020 FINDINGS: Cardiac contours are within normal limits AP technique. Prominence of the bilateral pulmonary arteries, unchanged when compared with prior exam. Mid and lower lung predominant interstitial opacities, increased when compared with the prior. No large pleural effusion. No evidence of pneumothorax. IMPRESSION: 1. Mid and lower lung predominant interstitial opacities, likely due to pulmonary edema. 2. Prominence of the bilateral pulmonary arteries, unchanged when compared with prior exam, findings can be seen in the setting of pulmonary hypertension. Electronically Signed   By: Yetta Glassman M.D.   On: 08/12/2022 12:51     TODAY-DAY OF DISCHARGE:  Subjective:   Aniaya Dural today has no headache,no chest abdominal pain,no new weakness tingling or numbness,  feels much better wants to go home today.   Objective:   Blood pressure (!) 141/73, pulse (P) 71, temperature 98.2 F (36.8 C), temperature source Oral, resp. rate 18, last menstrual period 12/14/2010, SpO2 95 %.  Intake/Output Summary (Last 24 hours) at 08/14/2022 0916 Last data filed at 08/14/2022 [id number] Gross per 24 hour  Intake 303.1 ml  Output --  Net 303.1 ml   There were no vitals filed for this visit.  Exam: Awake Alert, Oriented *3, No new F.N deficits, Normal affect .AT,PERRAL Supple Neck,No JVD, No cervical lymphadenopathy appriciated.  Symmetrical Chest wall movement, Good air movement bilaterally, CTAB RRR,No Gallops,Rubs or new Murmurs, No Parasternal Heave +ve B.Sounds, Abd Soft, Non tender, No organomegaly appriciated, No rebound -guarding or rigidity. No Cyanosis, Clubbing or edema, No new Rash or bruise   PERTINENT RADIOLOGIC STUDIES: VAS [place] LOWER EXTREMITY VENOUS (DVT)  Result Date: 08/13/2022  Lower Venous DVT Study Patient Name:  TERSEA MOSHER  Date of Exam:   08/13/2022 Medical Rec #: [id number]        Accession #:    [id number] Date of Birth: [birth year]       Patient Gender: F Patient Age:   39 years Exam Location:  [facility] Procedure:      VAS [place] LOWER EXTREMITY VENOUS (DVT) Referring Phys: Myles Rosenthal --------------------------------------------------------------------------------  Indications: Elevated D-Dimer (1.61).  Limitations: Body habitus, poor ultrasound/tissue interface and pain intolerance. Comparison Study: No previous exams Performing Technologist: Jody Hill RVT, RDMS  Examination Guidelines: A complete evaluation includes B-mode imaging, spectral Doppler, color Doppler, and power Doppler as needed of all accessible portions of each vessel. Bilateral testing is considered an integral part of a complete examination. Limited examinations for reoccurring indications may be performed as noted. The reflux portion of the exam is performed with  the patient in reverse Trendelenburg.  +--------+---------------+---------+-----------+----------+--------------------+ RIGHT   CompressibilityPhasicitySpontaneityPropertiesThrombus Aging       +--------+---------------+---------+-----------+----------+--------------------+ CFV     Full           Yes      Yes                                       +--------+---------------+---------+-----------+----------+--------------------+  SFJ     Full                                                              +--------+---------------+---------+-----------+----------+--------------------+ FV Prox Full           Yes      Yes                                       +--------+---------------+---------+-----------+----------+--------------------+ FV Mid  Full           Yes      Yes                                       +--------+---------------+---------+-----------+----------+--------------------+ FV                     Yes      Yes                  patent by            Distal                                               color/doppler        +--------+---------------+---------+-----------+----------+--------------------+ PFV     Full                                                              +--------+---------------+---------+-----------+----------+--------------------+ POP     Full           Yes      Yes                                       +--------+---------------+---------+-----------+----------+--------------------+ PTV     Full                                                              +--------+---------------+---------+-----------+----------+--------------------+ PERO    Full                                                              +--------+---------------+---------+-----------+----------+--------------------+   +--------+---------------+---------+-----------+----------+--------------------+ LEFT     CompressibilityPhasicitySpontaneityPropertiesThrombus Aging       +--------+---------------+---------+-----------+----------+--------------------+ CFV     Full           Yes  Yes                                       +--------+---------------+---------+-----------+----------+--------------------+ SFJ     Full                                                              +--------+---------------+---------+-----------+----------+--------------------+ FV Prox Full           Yes      Yes                                       +--------+---------------+---------+-----------+----------+--------------------+ FV Mid  Full           Yes      Yes                                       +--------+---------------+---------+-----------+----------+--------------------+ FV                     Yes      Yes                  patent by            Distal                                               color/doppler        +--------+---------------+---------+-----------+----------+--------------------+ PFV     Full                                                              +--------+---------------+---------+-----------+----------+--------------------+ POP     Full           Yes      Yes                                       +--------+---------------+---------+-----------+----------+--------------------+ PTV     Full                                                              +--------+---------------+---------+-----------+----------+--------------------+ PERO    Full                                                              +--------+---------------+---------+-----------+----------+--------------------+  Summary: BILATERAL: - No evidence of deep vein thrombosis seen in the lower extremities, bilaterally. -No evidence of popliteal cyst, bilaterally.   *See table(s) above for measurements and observations. Electronically signed by Deitra Mayo MD  on 08/13/2022 at 4:41:22 PM.    Final    ECHOCARDIOGRAM COMPLETE  Result Date: 08/13/2022    ECHOCARDIOGRAM REPORT   Patient Name:   Raiden Paluch Date of Exam: 08/13/2022 Medical Rec #:  [id number]       Height:       68.5 in Accession #:    [id number]      Weight:       246.0 lb Date of Birth:  10-02-1960      BSA:          2.244 m Patient Age:    35 years        BP:           110/60 mmHg Patient Gender: F               HR:           70 bpm. Exam Location:  Inpatient Procedure: 2D Echo, Cardiac Doppler and Color Doppler Indications:    Chest Pain R07.9  History:        Patient has no prior history of Echocardiogram examinations.                 Signs/Symptoms:Chest Pain; Risk Factors:Non-Smoker and                 Hypertension.  Sonographer:    Greer Pickerel Referring Phys: Clance Boll  Sonographer Comments: Image acquisition challenging due to patient body habitus and Image acquisition challenging due to respiratory motion. IMPRESSIONS  1. Left ventricular ejection fraction, by estimation, is 55 to 60%. The left ventricle has normal function. The left ventricle has no regional wall motion abnormalities. Left ventricular diastolic parameters are consistent with Grade I diastolic dysfunction (impaired relaxation).  2. Right ventricular systolic function is normal. The right ventricular size is normal. There is normal pulmonary artery systolic pressure.  3. Left atrial size was mild to moderately dilated.  4. The mitral valve is normal in structure. No evidence of mitral valve regurgitation. No evidence of mitral stenosis.  5. The aortic valve is normal in structure. Aortic valve regurgitation is not visualized. No aortic stenosis is present.  6. The inferior vena cava is dilated in size with <50% respiratory variability, suggesting right atrial pressure of 15 mmHg. FINDINGS  Left Ventricle: Left ventricular ejection fraction, by estimation, is 55 to 60%. The left ventricle has normal function. The left  ventricle has no regional wall motion abnormalities. The left ventricular internal cavity size was normal in size. There is  no left ventricular hypertrophy. Left ventricular diastolic parameters are consistent with Grade I diastolic dysfunction (impaired relaxation). Right Ventricle: The right ventricular size is normal. No increase in right ventricular wall thickness. Right ventricular systolic function is normal. There is normal pulmonary artery systolic pressure. The tricuspid regurgitant velocity is 2.60 m/s, and  with an assumed right atrial pressure of 8 mmHg, the estimated right ventricular systolic pressure is [id number] mmHg. Left Atrium: Left atrial size was mild to moderately dilated. Right Atrium: Right atrial size was normal in size. Pericardium: There is no evidence of pericardial effusion. Mitral Valve: The mitral valve is normal in structure. No evidence of mitral valve regurgitation. No evidence of mitral valve stenosis. Tricuspid Valve: The tricuspid valve is normal in  structure. Tricuspid valve regurgitation is not demonstrated. No evidence of tricuspid stenosis. Aortic Valve: The aortic valve is normal in structure. Aortic valve regurgitation is not visualized. No aortic stenosis is present. Pulmonic Valve: The pulmonic valve was normal in structure. Pulmonic valve regurgitation is not visualized. No evidence of pulmonic stenosis. Aorta: The aortic root is normal in size and structure. Venous: The inferior vena cava is dilated in size with less than 50% respiratory variability, suggesting right atrial pressure of 15 mmHg. IAS/Shunts: No atrial level shunt detected by color flow Doppler.  LEFT VENTRICLE PLAX 2D LVIDd:         5.60 cm   Diastology LVIDs:         3.90 cm   LV e' medial:    6.42 cm/s LV PW:         1.00 cm   LV E/e' medial:  14.9 LV IVS:        0.80 cm   LV e' lateral:   9.68 cm/s LVOT diam:     2.10 cm   LV E/e' lateral: 9.9 LV SV:         91 LV SV Index:   41 LVOT Area:     3.46 cm   RIGHT VENTRICLE RV S prime:     17.50 cm/s TAPSE (M-mode): 2.3 cm LEFT ATRIUM              Index        RIGHT ATRIUM           Index LA diam:        4.80 cm  2.14 cm/m   RA Area:     22.90 cm LA Vol (A2C):   129.0 ml 57.49 ml/m  RA Volume:   67.40 ml  30.04 ml/m LA Vol (A4C):   97.9 ml  43.63 ml/m LA Biplane Vol: 116.0 ml 51.70 ml/m  AORTIC VALVE LVOT Vmax:   114.00 cm/s LVOT Vmean:  77.400 cm/s LVOT VTI:    0.263 m  AORTA Ao Root diam: 3.30 cm Ao Asc diam:  3.90 cm MITRAL VALVE               TRICUSPID VALVE MV Area (PHT): 3.72 cm    TR Peak grad:   27.0 mmHg MV Decel Time: 204 msec    TR Vmax:        260.00 cm/s MV E velocity: 95.60 cm/s MV A velocity: 89.10 cm/s  SHUNTS MV E/A ratio:  1.07        Systemic VTI:  0.26 m                            Systemic Diam: 2.10 cm Aditya Sabharwal Electronically signed by Hebert Soho Signature Date/Time: 08/13/2022/2:52:41 PM    Final    CT ABDOMEN PELVIS W CONTRAST  Result Date: 08/12/2022 CLINICAL DATA:  Left-sided abdominal pain with cough EXAM: CT ABDOMEN AND PELVIS WITH CONTRAST TECHNIQUE: Multidetector CT imaging of the abdomen and pelvis was performed using the standard protocol following bolus administration of intravenous contrast. RADIATION DOSE REDUCTION: This exam was performed according to the departmental dose-optimization program which includes automated exposure control, adjustment of the mA and/or kV according to patient size and/or use of iterative reconstruction technique. CONTRAST:  69mL OMNIPAQUE IOHEXOL 350 MG/ML SOLN COMPARISON:  None Available. FINDINGS: Lower chest: Left-greater-than-right ground-glass opacities with focal consolidations in the lingula and left lower lobe. Trace bilateral pleural effusions.  Partially imaged heart size is normal. Hepatobiliary: No focal hepatic lesions. No intra or extrahepatic biliary ductal dilation. Normal gallbladder. Pancreas: No focal lesions or main ductal dilation. Spleen: Normal in size without  focal abnormality. Adrenals/Urinary Tract: No adrenal nodules. No suspicious renal mass, calculi or hydronephrosis. Bilateral upper pole simple cysts. No specific follow-up imaging recommended. Subcentimeter hypoattenuating focus in the lower pole right kidney, too small to characterize but also likely cysts. Mild circumferential mural thickening of the urinary bladder. Stomach/Bowel: Normal appearance of the stomach. No evidence of bowel wall thickening, distention, or inflammatory changes. Normal appendix. Vascular/Lymphatic: Aortic atherosclerosis. No enlarged abdominal or pelvic lymph nodes. Reproductive: No adnexal masses. Other: Small volume free fluid. Musculoskeletal: No acute or abnormal lytic or blastic osseous lesions. Asymmetrically expanded left rectus abdominis muscle containing a rounded focus of hyperattenuation measuring 5.1 x 3.9 cm (4:40). Overlying subcutaneous soft tissue stranding. IMPRESSION: 1. Asymmetrically expanded left rectus abdominis muscle containing a rounded focus of hyperattenuation measuring up to 5.1 cm. Overlying subcutaneous soft tissue stranding. Findings are suggestive of a rectus sheath hematoma. Recommend continued clinical follow-up to resolution to exclude underlying mass lesion. 2. Left-greater-than-right ground-glass opacities with focal consolidations in the lingula and left lower lobe, likely multifocal pneumonia. 3. Trace bilateral pleural effusions. 4. Mild circumferential mural thickening of the urinary bladder. Correlate with urinalysis to exclude cystitis. 5. Aortic Atherosclerosis (ICD10-I70.0). Electronically Signed   By: Darrin Nipper M.D.   On: 08/12/2022 17:50   DG Chest Port 1 View  Result Date: 08/12/2022 CLINICAL DATA:  Cough and left-sided chest pain EXAM: PORTABLE CHEST 1 VIEW COMPARISON:  Chest x-ray dated February 22, 2020 FINDINGS: Cardiac contours are within normal limits AP technique. Prominence of the bilateral pulmonary arteries, unchanged when  compared with prior exam. Mid and lower lung predominant interstitial opacities, increased when compared with the prior. No large pleural effusion. No evidence of pneumothorax. IMPRESSION: 1. Mid and lower lung predominant interstitial opacities, likely due to pulmonary edema. 2. Prominence of the bilateral pulmonary arteries, unchanged when compared with prior exam, findings can be seen in the setting of pulmonary hypertension. Electronically Signed   By: Yetta Glassman M.D.   On: 08/12/2022 12:51     PERTINENT LAB RESULTS: CBC: Recent Labs    08/13/22 0259 08/14/22 0314  WBC 10.6* 5.6  HGB 9.6* 9.6*  HCT 29.1* 29.3*  PLT 147* 183   CMET CMP     Component Value Date/Time   NA 135 08/14/2022 0314   K 4.6 08/14/2022 0314   CL 104 08/14/2022 0314   CO2 24 08/14/2022 0314   GLUCOSE 176 (H) 08/14/2022 0314   BUN 14 08/14/2022 0314   CREATININE 0.80 08/14/2022 0314   CALCIUM 8.1 (L) 08/14/2022 0314   PROT 5.8 (L) 08/13/2022 0259   ALBUMIN 2.4 (L) 08/13/2022 0259   AST 21 08/13/2022 0259   ALT 10 08/13/2022 0259   ALKPHOS 48 08/13/2022 0259   BILITOT 0.7 08/13/2022 0259   GFRNONAA >60 08/14/2022 0314   GFRAA >60 02/14/2020 0427    GFR CrCl cannot be calculated (Unknown ideal weight.). Recent Labs    08/12/22 1049  LIPASE 33   No results for input(s): "CKTOTAL", "CKMB", "CKMBINDEX", "TROPONINI" in the last 72 hours. Invalid input(s): "POCBNP" Recent Labs    08/13/22 0259  DDIMER 1.61*   No results for input(s): "HGBA1C" in the last 72 hours. No results for input(s): "CHOL", "HDL", "LDLCALC", "TRIG", "CHOLHDL", "LDLDIRECT" in the last 72 hours. Recent Labs  08/12/22 2303  TSH 1.190   Recent Labs    08/12/22 2303  VITAMINB12 1,175*  FOLATE 19.1  FERRITIN 143  TIBC 192*  IRON 9*  RETICCTPCT 0.5   Coags: Recent Labs    08/12/22 1940  INR 1.1   Microbiology: Recent Results (from the past 240 hour(s))  Culture, blood (Routine X 2) w Reflex to ID Panel      Status: None (Preliminary result)   Collection Time: 08/12/22  7:35 PM   Specimen: BLOOD LEFT ARM  Result Value Ref Range Status   Specimen Description BLOOD LEFT ARM  Final   Special Requests   Final    BOTTLES DRAWN AEROBIC AND ANAEROBIC Blood Culture results may not be optimal due to an inadequate volume of blood received in culture bottles   Culture   Final    NO GROWTH 2 DAYS Performed at [facility], [place] [address] [place], [place] [address]    Report Status PENDING  Incomplete  Culture, blood (Routine X 2) w Reflex to ID Panel     Status: None (Preliminary result)   Collection Time: 08/12/22  7:40 PM   Specimen: BLOOD RIGHT ARM  Result Value Ref Range Status   Specimen Description BLOOD RIGHT ARM  Final   Special Requests   Final    BOTTLES DRAWN AEROBIC AND ANAEROBIC Blood Culture results may not be optimal due to an inadequate volume of blood received in culture bottles   Culture   Final    NO GROWTH 2 DAYS Performed at [facility], [place] [address], [place], [place] [address]    Report Status PENDING  Incomplete  Resp panel by RT-PCR (RSV, Flu A&B, Covid) Anterior Nasal Swab     Status: Abnormal   Collection Time: 08/12/22  8:50 PM   Specimen: Anterior Nasal Swab  Result Value Ref Range Status   SARS Coronavirus 2 by RT PCR NEGATIVE NEGATIVE Final   Influenza A by PCR POSITIVE (A) NEGATIVE Final   Influenza B by PCR NEGATIVE NEGATIVE Final    Comment: (NOTE) The Xpert Xpress SARS-CoV-2/FLU/RSV plus assay is intended as an aid in the diagnosis of influenza from Nasopharyngeal swab specimens and should not be used as a sole basis for treatment. Nasal washings and aspirates are unacceptable for Xpert Xpress SARS-CoV-2/FLU/RSV testing.  Fact Sheet for Patients: EntrepreneurPulse.com.au  Fact Sheet for Healthcare Providers: IncredibleEmployment.be  This test is not yet approved or cleared by the [place] FDA  and has been authorized for detection and/or diagnosis of SARS-CoV-2 by FDA under an Emergency Use Authorization (EUA). This EUA will remain in effect (meaning this test can be used) for the duration of the COVID-19 declaration under Section 564(b)(1) of the Act, 21 U.S.C. section 360bbb-3(b)(1), unless the authorization is terminated or revoked.     Resp Syncytial Virus by PCR NEGATIVE NEGATIVE Final    Comment: (NOTE) Fact Sheet for Patients: EntrepreneurPulse.com.au  Fact Sheet for Healthcare Providers: IncredibleEmployment.be  This test is not yet approved or cleared by the [place] FDA and has been authorized for detection and/or diagnosis of SARS-CoV-2 by FDA under an Emergency Use Authorization (EUA). This EUA will remain in effect (meaning this test can be used) for the duration of the COVID-19 declaration under Section 564(b)(1) of the Act, 21 U.S.C. section 360bbb-3(b)(1), unless the authorization is terminated or revoked.  Performed at [facility], [place] [address] [place], [place] [address]   Respiratory (~20 pathogens) panel  by PCR     Status: Abnormal   Collection Time: 08/12/22  8:50 PM   Specimen: Nasopharyngeal Swab; Respiratory  Result Value Ref Range Status   Adenovirus NOT DETECTED NOT DETECTED Final   Coronavirus 229E NOT DETECTED NOT DETECTED Final    Comment: (NOTE) The Coronavirus on the Respiratory Panel, DOES NOT test for the novel  Coronavirus (2019 nCoV)    Coronavirus HKU1 NOT DETECTED NOT DETECTED Final   Coronavirus NL63 NOT DETECTED NOT DETECTED Final   Coronavirus OC43 NOT DETECTED NOT DETECTED Final   Metapneumovirus NOT DETECTED NOT DETECTED Final   Rhinovirus / Enterovirus NOT DETECTED NOT DETECTED Final   Influenza A DETECTED (A) NOT DETECTED Final    Comment: Referred to [facility] in [place], [place] for serotyping.   Influenza B NOT DETECTED NOT DETECTED Final   Parainfluenza Virus  1 NOT DETECTED NOT DETECTED Final   Parainfluenza Virus 2 NOT DETECTED NOT DETECTED Final   Parainfluenza Virus 3 NOT DETECTED NOT DETECTED Final   Parainfluenza Virus 4 NOT DETECTED NOT DETECTED Final   Respiratory Syncytial Virus NOT DETECTED NOT DETECTED Final   Bordetella pertussis NOT DETECTED NOT DETECTED Final   Bordetella Parapertussis NOT DETECTED NOT DETECTED Final   Chlamydophila pneumoniae NOT DETECTED NOT DETECTED Final   Mycoplasma pneumoniae NOT DETECTED NOT DETECTED Final    Comment: Performed at [facility], [place] [address] [place], [place] [address]  Culture, blood (routine x 2) Call MD if unable to obtain prior to antibiotics being given     Status: None (Preliminary result)   Collection Time: 08/12/22 11:03 PM   Specimen: BLOOD RIGHT ARM  Result Value Ref Range Status   Specimen Description BLOOD RIGHT ARM  Final   Special Requests   Final    BOTTLES DRAWN AEROBIC AND ANAEROBIC Blood Culture results may not be optimal due to an inadequate volume of blood received in culture bottles   Culture   Final    NO GROWTH 2 DAYS Performed at [facility], [place] [address], [place], [place] [address]    Report Status PENDING  Incomplete  Culture, blood (routine x 2) Call MD if unable to obtain prior to antibiotics being given     Status: None (Preliminary result)   Collection Time: 08/12/22 11:03 PM   Specimen: BLOOD LEFT ARM  Result Value Ref Range Status   Specimen Description BLOOD LEFT ARM  Final   Special Requests   Final    BOTTLES DRAWN AEROBIC AND ANAEROBIC Blood Culture results may not be optimal due to an inadequate volume of blood received in culture bottles   Culture   Final    NO GROWTH 2 DAYS Performed at [facility], [place] [address], [place], [place] [address]    Report Status PENDING  Incomplete    FURTHER DISCHARGE INSTRUCTIONS:  Get Medicines reviewed and adjusted: Please take all your medications with you for your next visit with  your Primary MD  Laboratory/radiological data: Please request your Primary MD to go over all hospital tests and procedure/radiological results at the follow up, please ask your Primary MD to get all Hospital records sent to his/her office.  In some cases, they will be blood work, cultures and biopsy results pending at the time of your discharge. Please request that your primary care M.D. goes through all the records of your hospital data and follows up on these results.  Also Note the following: If you  experience worsening of your admission symptoms, develop shortness of breath, life threatening emergency, suicidal or homicidal thoughts you must seek medical attention immediately by calling 911 or calling your MD immediately  if symptoms less severe.  You must read complete instructions/literature along with all the possible adverse reactions/side effects for all the Medicines you take and that have been prescribed to you. Take any new Medicines after you have completely understood and accpet all the possible adverse reactions/side effects.   Do not drive when taking Pain medications or sleeping medications (Benzodaizepines)  Do not take more than prescribed Pain, Sleep and Anxiety Medications. It is not advisable to combine anxiety,sleep and pain medications without talking with your primary care practitioner  Special Instructions: If you have smoked or chewed Tobacco  in the last 2 yrs please stop smoking, stop any regular Alcohol  and or any Recreational drug use.  Wear Seat belts while driving.  Please note: You were cared for by a hospitalist during your hospital stay. Once you are discharged, your primary care physician will handle any further medical issues. Please note that NO REFILLS for any discharge medications will be authorized once you are discharged, as it is imperative that you return to your primary care physician (or establish a relationship with a primary care physician if you  do not have one) for your post hospital discharge needs so that they can reassess your need for medications and monitor your lab values.  Total Time spent coordinating discharge including counseling, education and face to face time equals greater than 30 minutes.  SignedOren Binet 08/14/2022 9:16 AM

## 2022-08-14 NOTE — Progress Notes (Signed)
Discharge instructions (including medications) discussed with and copy provided to patient/caregiver, Picked up TOC medications and gave them to the patient.

## 2022-08-17 LAB — CULTURE, BLOOD (ROUTINE X 2)
Culture: NO GROWTH
Culture: NO GROWTH
Culture: NO GROWTH
Culture: NO GROWTH

## 2022-09-20 ENCOUNTER — Other Ambulatory Visit: Payer: 59

## 2023-03-25 ENCOUNTER — Other Ambulatory Visit: Payer: Self-pay | Admitting: Internal Medicine

## 2023-03-25 DIAGNOSIS — Z1231 Encounter for screening mammogram for malignant neoplasm of breast: Secondary | ICD-10-CM

## 2023-04-30 ENCOUNTER — Ambulatory Visit
Admission: RE | Admit: 2023-04-30 | Discharge: 2023-04-30 | Disposition: A | Discharge status: Home / Self Care | Payer: 59 | Source: Ambulatory Visit | Attending: Internal Medicine | Admitting: Internal Medicine

## 2023-04-30 DIAGNOSIS — Z1231 Encounter for screening mammogram for malignant neoplasm of breast: Secondary | ICD-10-CM

## 2023-06-23 ENCOUNTER — Encounter: Payer: Self-pay | Admitting: Obstetrics and Gynecology

## 2023-06-23 ENCOUNTER — Ambulatory Visit (INDEPENDENT_AMBULATORY_CARE_PROVIDER_SITE_OTHER): Payer: 59 | Admitting: Obstetrics and Gynecology

## 2023-06-23 ENCOUNTER — Other Ambulatory Visit (HOSPITAL_COMMUNITY)
Admission: RE | Admit: 2023-06-23 | Discharge: 2023-06-23 | Disposition: A | Discharge status: Home / Self Care | Payer: 59 | Source: Ambulatory Visit | Attending: Obstetrics and Gynecology | Admitting: Obstetrics and Gynecology

## 2023-06-23 VITALS — BP 112/72 | HR 74 | Ht 68.0 in | Wt 248.0 lb

## 2023-06-23 DIAGNOSIS — Z1339 Encounter for screening examination for other mental health and behavioral disorders: Secondary | ICD-10-CM

## 2023-06-23 DIAGNOSIS — Z01419 Encounter for gynecological examination (general) (routine) without abnormal findings: Secondary | ICD-10-CM

## 2023-06-23 NOTE — Progress Notes (Signed)
Subjective:     Caroline Harvey is a 63 y.o. female P1 postmenopausal with BMI 37 who is here for a comprehensive physical exam. The patient reports no problems. She is not sexually active. She denies any episodes of vaginal bleeding. She denies urinary incontinence. She reports some constipation. 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 Patient denies pelvic pain or abnormal discharge. She is without any other complaints  Past Medical History:  Diagnosis Date   Alcohol abuse    Has not had a drink 12/09/2019   Anemia    Anxiety    Asthma    GERD (gastroesophageal reflux disease)    Hypertension    Hyponatremia 09/2014   Past Surgical History:  Procedure Laterality Date   ANKLE SURGERY Right    x  2 (achilles tendon repair, and then broken ankle)   SINUS ENDO WITH FUSION Bilateral 04/05/2022   Procedure: FUNCTIONAL ENDOSCOPIC SINUS SURGERY OF THE FRONTAL, ETHMOID, MAXILLARY AND SPHENOID SINUSES WITH FUSION NAVIGATION;  Surgeon: Scarlette Ar, MD;  Location: [facility] OR;  Service: ENT;  Laterality: Bilateral;   Family History  Problem Relation Age of Onset   Hypertension Mother    Colon cancer Maternal Aunt    Breast cancer Paternal Aunt        ? age of onset   Breast cancer Paternal Aunt        ? age of onset   Stomach cancer Maternal Grandmother    Esophageal cancer Neg Hx    Rectal cancer Neg Hx     Social History   Socioeconomic History   Marital status: Single    Spouse name: Not on file   Number of children: 1   Years of education: Not on file   Highest education level: Not on file  Occupational History   Not on file  Tobacco Use   Smoking status: Never   Smokeless tobacco: Current   Tobacco comments:    VAPING  Vaping Use   Vaping status: Every Day   Substances: Nicotine  Substance and Sexual Activity   Alcohol use: Not Currently    Comment: Quit 12/09/19   Drug use: No   Sexual activity: Not Currently    Partners: Female  Other Topics Concern   Not on  file  Social History Narrative   Not on file   Social Drivers of Health   Financial Resource Strain: Not on file  Food Insecurity: No Food Insecurity (08/12/2022)   Hunger Vital Sign    Worried About Running Out of Food in the Last Year: Never true    Ran Out of Food in the Last Year: Never true  Transportation Needs: No Transportation Needs (08/12/2022)   PRAPARE - Administrator, Civil Service (Medical): No    Lack of Transportation (Non-Medical): No  Physical Activity: Not on file  Stress: Not on file  Social Connections: Not on file  Intimate Partner Violence: Not At Risk (08/12/2022)   Humiliation, Afraid, Rape, and Kick questionnaire    Fear of Current or Ex-Partner: No    Emotionally Abused: No    Physically Abused: No    Sexually Abused: No   Health Maintenance  Topic Date Due   Pneumococcal Vaccine 49-76 Years old (1 of 2 - PCV) Never done   Hepatitis C Screening  Never done   Zoster Vaccines- Shingrix (1 of 2) Never done   DTaP/Tdap/Td (2 - Tdap) 02/08/2019   INFLUENZA VACCINE  Never done  COVID-19 Vaccine (1 - 2024-25 season) Never done   Colonoscopy  12/26/2023   MAMMOGRAM  04/29/2025   Cervical Cancer Screening (HPV/Pap Cotest)  10/18/2025   HIV Screening  Completed   HPV VACCINES  Aged Out       Review of Systems Pertinent items noted in HPI and remainder of comprehensive ROS otherwise negative.   Objective:  Blood pressure 112/72, pulse 74, height 5\' 8"  (1.727 m), weight 248 lb (112.5 kg), last menstrual period 12/14/2010.   GENERAL: Well-developed, well-nourished female in no acute distress.  HEENT: Normocephalic, atraumatic. Sclerae anicteric.  NECK: Supple. Normal thyroid.  LUNGS: Clear to auscultation bilaterally.  HEART: Regular rate and rhythm. BREASTS: Symmetric in size. No palpable masses or lymphadenopathy, skin changes, or nipple drainage. ABDOMEN: Soft, nontender, nondistended. No organomegaly. PELVIC: Normal external female  genitalia. Vagina is pink and rugated.  Normal discharge. Normal appearing cervix. Uterus is normal in size. No adnexal mass or tenderness. Chaperone present during the pelvic exam EXTREMITIES: No cyanosis, clubbing, or edema, 2+ distal pulses.     Assessment:    Healthy female exam.      Plan:    Pap smear collected Patient is current on mammogram Patient desires to defer health maintenance labs with PCP who she is scheduled to see next month Patient due for colonoscopy this year. Referral placed in EPIC Patient will be contacted with abnormal results See After Visit Summary for Counseling Recommendations

## 2023-06-23 NOTE — Progress Notes (Signed)
63 y.o. GYN presents for AEX, declined STD screening.  Reports no concerns today.   Last Mammogram 04/30/2023

## 2023-06-25 LAB — CYTOLOGY - PAP
Comment: NEGATIVE
Diagnosis: NEGATIVE
High risk HPV: NEGATIVE

## 2023-12-27 ENCOUNTER — Encounter: Payer: Self-pay | Admitting: Gastroenterology

## 2024-04-12 ENCOUNTER — Other Ambulatory Visit: Payer: Self-pay | Admitting: Internal Medicine

## 2024-04-12 DIAGNOSIS — Z0000 Encounter for general adult medical examination without abnormal findings: Secondary | ICD-10-CM

## 2024-05-11 ENCOUNTER — Ambulatory Visit

## 2024-05-24 ENCOUNTER — Ambulatory Visit
Admission: RE | Admit: 2024-05-24 | Discharge: 2024-05-24 | Disposition: A | Source: Ambulatory Visit | Attending: Internal Medicine | Admitting: Internal Medicine

## 2024-05-24 DIAGNOSIS — Z0000 Encounter for general adult medical examination without abnormal findings: Secondary | ICD-10-CM

## 2024-06-03 ENCOUNTER — Ambulatory Visit: Payer: Self-pay | Admitting: Obstetrics
